# Patient Record
Sex: Male | Born: 1945 | Race: White | Hispanic: No | Marital: Married | Smoking: Former smoker
Health system: Southern US, Community
[De-identification: ages and names within clinical notes are randomized; demographics above are authoritative.]

## PROBLEM LIST (undated history)

## (undated) DIAGNOSIS — Q613 Polycystic kidney, unspecified: Secondary | ICD-10-CM

## (undated) DIAGNOSIS — E785 Hyperlipidemia, unspecified: Secondary | ICD-10-CM

## (undated) DIAGNOSIS — M5126 Other intervertebral disc displacement, lumbar region: Secondary | ICD-10-CM

## (undated) DIAGNOSIS — M51369 Other intervertebral disc degeneration, lumbar region without mention of lumbar back pain or lower extremity pain: Secondary | ICD-10-CM

## (undated) DIAGNOSIS — M5136 Other intervertebral disc degeneration, lumbar region: Secondary | ICD-10-CM

## (undated) DIAGNOSIS — D649 Anemia, unspecified: Secondary | ICD-10-CM

## (undated) DIAGNOSIS — M1990 Unspecified osteoarthritis, unspecified site: Secondary | ICD-10-CM

## (undated) DIAGNOSIS — L0390 Cellulitis, unspecified: Secondary | ICD-10-CM

## (undated) DIAGNOSIS — M19079 Primary osteoarthritis, unspecified ankle and foot: Secondary | ICD-10-CM

## (undated) DIAGNOSIS — M5430 Sciatica, unspecified side: Secondary | ICD-10-CM

## (undated) DIAGNOSIS — M109 Gout, unspecified: Secondary | ICD-10-CM

## (undated) HISTORY — DX: Sciatica, unspecified side: M54.30

## (undated) HISTORY — DX: Gout, unspecified: M10.9

## (undated) HISTORY — DX: Other intervertebral disc degeneration, lumbar region: M51.36

## (undated) HISTORY — PX: CYST EXCISION: SHX5701

## (undated) HISTORY — PX: TONSILLECTOMY: SUR1361

## (undated) HISTORY — DX: Other intervertebral disc displacement, lumbar region: M51.26

## (undated) HISTORY — DX: Other intervertebral disc degeneration, lumbar region without mention of lumbar back pain or lower extremity pain: M51.369

## (undated) HISTORY — DX: Hyperlipidemia, unspecified: E78.5

---

## 2011-10-01 DIAGNOSIS — N189 Chronic kidney disease, unspecified: Secondary | ICD-10-CM | POA: Insufficient documentation

## 2013-08-17 ENCOUNTER — Ambulatory Visit (INDEPENDENT_AMBULATORY_CARE_PROVIDER_SITE_OTHER): Payer: Medicare Other | Admitting: Family Medicine

## 2013-08-17 ENCOUNTER — Encounter (INDEPENDENT_AMBULATORY_CARE_PROVIDER_SITE_OTHER): Payer: Self-pay

## 2013-08-17 VITALS — BP 116/73 | HR 89 | Temp 97.4°F | Wt 191.0 lb

## 2013-08-17 DIAGNOSIS — L409 Psoriasis, unspecified: Secondary | ICD-10-CM

## 2013-08-17 DIAGNOSIS — R059 Cough, unspecified: Secondary | ICD-10-CM

## 2013-08-17 DIAGNOSIS — J069 Acute upper respiratory infection, unspecified: Secondary | ICD-10-CM

## 2013-08-17 DIAGNOSIS — L408 Other psoriasis: Secondary | ICD-10-CM

## 2013-08-17 DIAGNOSIS — R05 Cough: Secondary | ICD-10-CM

## 2013-08-17 MED ORDER — AZITHROMYCIN 250 MG PO TABS: ORAL_TABLET | ORAL | Status: DC

## 2013-08-17 MED ORDER — BENZONATATE 100 MG PO CAPS: 200.0000 mg | ORAL_CAPSULE | Freq: Three times a day (TID) | ORAL | Status: DC | PRN

## 2013-08-17 MED ORDER — METHYLPREDNISOLONE ACETATE 80 MG/ML IJ SUSP
80.0000 mg | Freq: Once | INTRAMUSCULAR | Status: AC
  Administered 2013-08-17: 80 mg via INTRAMUSCULAR

## 2013-08-17 MED ORDER — FLUOCINONIDE 0.05 % EX SOLN: 1.0000 "application " | Freq: Two times a day (BID) | CUTANEOUS | Status: DC

## 2013-08-17 NOTE — Patient Instructions (Signed)

## 2013-08-17 NOTE — Progress Notes (Signed)
   Subjective:    Patient ID: VA CHUKWU, male    DOB: 1946-02-02, 68 y.o.   MRN: [id number]  HPI  This 68 y.o. male presents for evaluation of URI sx's.  He is taking otc cough and cold meds and they are not helping.  He is having sinus drainage and sinus pressure.  He Has been coughing alot.  He would like a refill on his psoriasis medicine.  Review of Systems    No chest pain, SOB, HA, dizziness, vision change, N/V, diarrhea, constipation, dysuria, urinary urgency or frequency, myalgias, arthralgias or rash.  Objective:   Physical Exam Vital signs noted  Well developed well nourished male.  HEENT - Head atraumatic Normocephalic                Eyes - PERRLA, Conjuctiva - clear Sclera- Clear EOMI                Ears - EAC's Wnl TM's Wnl Gross Hearing WNL                Nose - Nares patent                 Throat - oropharanx wnl Respiratory - Lungs CTA bilateral Cardiac - RRR S1 and S2 without murmur GI - Abdomen soft Nontender and bowel sounds active x 4 Extremities - No edema. Neuro - Grossly intact.       Assessment & Plan:  Psoriasis - Plan: fluocinonide (LIDEX) 0.05 % external solution  URI (upper respiratory infection) - Plan: azithromycin (ZITHROMAX) 250 MG tablet, methylPREDNISolone acetate (DEPO-MEDROL) injection 80 mg  Cough - Tessalon perles 100mg  2 po tid prn cough #30w/1rf Push po fluids, rest, tylenol and motrin otc prn as directed for fever, arthralgias, and myalgias.  Follow up prn if sx's continue or persist.  Lysbeth Penner FNP

## 2014-07-28 DIAGNOSIS — M5430 Sciatica, unspecified side: Secondary | ICD-10-CM

## 2014-07-28 HISTORY — DX: Sciatica, unspecified side: M54.30

## 2014-08-24 ENCOUNTER — Ambulatory Visit (INDEPENDENT_AMBULATORY_CARE_PROVIDER_SITE_OTHER): Payer: Medicare Other

## 2014-08-24 ENCOUNTER — Encounter (INDEPENDENT_AMBULATORY_CARE_PROVIDER_SITE_OTHER): Payer: Self-pay

## 2014-08-24 ENCOUNTER — Encounter: Payer: Self-pay | Admitting: Family Medicine

## 2014-08-24 ENCOUNTER — Other Ambulatory Visit: Payer: Self-pay | Admitting: Family Medicine

## 2014-08-24 ENCOUNTER — Ambulatory Visit (INDEPENDENT_AMBULATORY_CARE_PROVIDER_SITE_OTHER): Payer: Medicare Other | Admitting: Family Medicine

## 2014-08-24 VITALS — BP 122/84 | HR 112 | Temp 98.2°F | Ht 71.0 in | Wt 189.4 lb

## 2014-08-24 DIAGNOSIS — M25571 Pain in right ankle and joints of right foot: Secondary | ICD-10-CM

## 2014-08-24 DIAGNOSIS — S82892A Other fracture of left lower leg, initial encounter for closed fracture: Secondary | ICD-10-CM

## 2014-08-24 DIAGNOSIS — S82891A Other fracture of right lower leg, initial encounter for closed fracture: Secondary | ICD-10-CM

## 2014-08-24 MED ORDER — METHYLPREDNISOLONE (PAK) 4 MG PO TABS: ORAL_TABLET | ORAL | Status: DC

## 2014-08-24 MED ORDER — HYDROCODONE-ACETAMINOPHEN 5-325 MG PO TABS: 1.0000 | ORAL_TABLET | Freq: Four times a day (QID) | ORAL | Status: DC | PRN

## 2014-08-24 NOTE — Progress Notes (Signed)
   Subjective:    Patient ID: Michael Cervantes, male    DOB: Apr 07, 1946, 69 y.o.   MRN: [id number]  HPI Patient is here for c/o right ankle pain.  He denies any trauma and states he took his gout medicine but it has not helped.  Review of Systems  Constitutional: Negative for fever.  HENT: Negative for ear pain.   Eyes: Negative for discharge.  Respiratory: Negative for cough.   Cardiovascular: Negative for chest pain.  Gastrointestinal: Negative for abdominal distention.  Endocrine: Negative for polyuria.  Genitourinary: Negative for difficulty urinating.  Musculoskeletal: Positive for joint swelling. Negative for gait problem and neck pain.       Right medial ankle swelling and tender.  Skin: Negative for color change and rash.  Neurological: Negative for speech difficulty and headaches.  Psychiatric/Behavioral: Negative for agitation.       Objective:    BP 122/84 mmHg  Pulse 112  Temp(Src) 98.2 F (36.8 C) (Oral)  Ht 5\' 11"  (1.803 m)  Wt 189 lb 6.4 oz (85.911 kg)  BMI 26.43 kg/m2 Physical Exam  Constitutional: He is oriented to person, place, and time. He appears well-developed and well-nourished.  HENT:  Head: Normocephalic and atraumatic.  Mouth/Throat: Oropharynx is clear and moist.  Eyes: Pupils are equal, round, and reactive to light.  Neck: Normal range of motion. Neck supple.  Cardiovascular: Normal rate and regular rhythm.   No murmur heard. Pulmonary/Chest: Effort normal and breath sounds normal.  Abdominal: Soft. Bowel sounds are normal. There is no tenderness.  Musculoskeletal: He exhibits edema and tenderness.  TTP right medial ankle and swelling right medial ankle  Neurological: He is alert and oriented to person, place, and time.  Skin: Skin is warm and dry.  Psychiatric: He has a normal mood and affect.    Xray right ankle - no fx Prelimnary reading by Iverson Alamin      Assessment & Plan:     ICD-9-CM ICD-10-CM   1. Pain in joint, ankle  and foot, right 719.47 M25.571 DG Foot Complete Right     methylPREDNIsolone (MEDROL DOSPACK) 4 MG tablet     HYDROcodone-acetaminophen (NORCO) 5-325 MG per tablet     No Follow-up on file.  Lysbeth Penner FNP

## 2014-08-28 DIAGNOSIS — L0390 Cellulitis, unspecified: Secondary | ICD-10-CM

## 2014-08-28 HISTORY — DX: Cellulitis, unspecified: L03.90

## 2014-09-12 ENCOUNTER — Inpatient Hospital Stay (HOSPITAL_COMMUNITY)
Admission: EM | Admit: 2014-09-12 | Discharge: 2014-09-15 | DRG: 872 | Disposition: A | Discharge status: Home / Self Care | Payer: Medicare Other | Attending: Family Medicine | Admitting: Family Medicine

## 2014-09-12 ENCOUNTER — Encounter (HOSPITAL_COMMUNITY): Payer: Self-pay | Admitting: Emergency Medicine

## 2014-09-12 ENCOUNTER — Emergency Department (HOSPITAL_COMMUNITY): Payer: Medicare Other

## 2014-09-12 ENCOUNTER — Inpatient Hospital Stay (HOSPITAL_COMMUNITY): Payer: Medicare Other

## 2014-09-12 DIAGNOSIS — M1990 Unspecified osteoarthritis, unspecified site: Secondary | ICD-10-CM | POA: Diagnosis present

## 2014-09-12 DIAGNOSIS — L03115 Cellulitis of right lower limb: Secondary | ICD-10-CM | POA: Insufficient documentation

## 2014-09-12 DIAGNOSIS — R42 Dizziness and giddiness: Secondary | ICD-10-CM | POA: Diagnosis not present

## 2014-09-12 DIAGNOSIS — N189 Chronic kidney disease, unspecified: Secondary | ICD-10-CM | POA: Diagnosis present

## 2014-09-12 DIAGNOSIS — M25571 Pain in right ankle and joints of right foot: Secondary | ICD-10-CM

## 2014-09-12 DIAGNOSIS — I129 Hypertensive chronic kidney disease with stage 1 through stage 4 chronic kidney disease, or unspecified chronic kidney disease: Secondary | ICD-10-CM | POA: Diagnosis present

## 2014-09-12 DIAGNOSIS — R05 Cough: Secondary | ICD-10-CM

## 2014-09-12 DIAGNOSIS — M109 Gout, unspecified: Secondary | ICD-10-CM | POA: Diagnosis present

## 2014-09-12 DIAGNOSIS — Q613 Polycystic kidney, unspecified: Secondary | ICD-10-CM | POA: Diagnosis not present

## 2014-09-12 DIAGNOSIS — N179 Acute kidney failure, unspecified: Secondary | ICD-10-CM | POA: Diagnosis present

## 2014-09-12 DIAGNOSIS — M79671 Pain in right foot: Secondary | ICD-10-CM

## 2014-09-12 DIAGNOSIS — I9589 Other hypotension: Secondary | ICD-10-CM | POA: Insufficient documentation

## 2014-09-12 DIAGNOSIS — Z87891 Personal history of nicotine dependence: Secondary | ICD-10-CM

## 2014-09-12 DIAGNOSIS — R6520 Severe sepsis without septic shock: Secondary | ICD-10-CM | POA: Diagnosis present

## 2014-09-12 DIAGNOSIS — R059 Cough, unspecified: Secondary | ICD-10-CM

## 2014-09-12 DIAGNOSIS — M10361 Gout due to renal impairment, right knee: Secondary | ICD-10-CM | POA: Insufficient documentation

## 2014-09-12 DIAGNOSIS — A419 Sepsis, unspecified organism: Secondary | ICD-10-CM | POA: Diagnosis not present

## 2014-09-12 DIAGNOSIS — L0390 Cellulitis, unspecified: Secondary | ICD-10-CM | POA: Diagnosis present

## 2014-09-12 HISTORY — DX: Polycystic kidney, unspecified: Q61.3

## 2014-09-12 HISTORY — DX: Unspecified osteoarthritis, unspecified site: M19.90

## 2014-09-12 HISTORY — DX: Primary osteoarthritis, unspecified ankle and foot: M19.079

## 2014-09-12 HISTORY — DX: Anemia, unspecified: D64.9

## 2014-09-12 HISTORY — DX: Cellulitis, unspecified: L03.90

## 2014-09-12 LAB — COMPREHENSIVE METABOLIC PANEL
ALT: 18 U/L (ref 0–53)
AST: 20 U/L (ref 0–37)
Albumin: 3.5 g/dL (ref 3.5–5.2)
Alkaline Phosphatase: 101 U/L (ref 39–117)
Anion gap: 14 (ref 5–15)
BUN: 77 mg/dL — ABNORMAL HIGH (ref 6–23)
CHLORIDE: 104 mmol/L (ref 96–112)
CO2: 20 mmol/L (ref 19–32)
Calcium: 10.1 mg/dL (ref 8.4–10.5)
Creatinine, Ser: 3.33 mg/dL — ABNORMAL HIGH (ref 0.50–1.35)
GFR calc Af Amer: 20 mL/min — ABNORMAL LOW (ref >90)
GFR calc non Af Amer: 17 mL/min — ABNORMAL LOW (ref >90)
GLUCOSE: 105 mg/dL — AB (ref 70–99)
Potassium: 4.7 mmol/L (ref 3.5–5.1)
Sodium: 138 mmol/L (ref 135–145)
TOTAL PROTEIN: 7.4 g/dL (ref 6.0–8.3)
Total Bilirubin: 1 mg/dL (ref 0.3–1.2)

## 2014-09-12 LAB — CBC WITH DIFFERENTIAL/PLATELET
Basophils Absolute: 0 10*3/uL (ref 0.0–0.1)
Basophils Relative: 0 % (ref 0–1)
EOS ABS: 0.1 10*3/uL (ref 0.0–0.7)
Eosinophils Relative: 0 % (ref 0–5)
HEMATOCRIT: 34.1 % — AB (ref 39.0–52.0)
HEMOGLOBIN: 11.5 g/dL — AB (ref 13.0–17.0)
Lymphocytes Relative: 9 % — ABNORMAL LOW (ref 12–46)
Lymphs Abs: 1 10*3/uL (ref 0.7–4.0)
MCH: 32.8 pg (ref 26.0–34.0)
MCHC: 33.7 g/dL (ref 30.0–36.0)
MCV: 97.2 fL (ref 78.0–100.0)
MONOS PCT: 13 % — AB (ref 3–12)
Monocytes Absolute: 1.5 10*3/uL — ABNORMAL HIGH (ref 0.1–1.0)
NEUTROS PCT: 78 % — AB (ref 43–77)
Neutro Abs: 9.2 10*3/uL — ABNORMAL HIGH (ref 1.7–7.7)
Platelets: 258 10*3/uL (ref 150–400)
RBC: 3.51 MIL/uL — ABNORMAL LOW (ref 4.22–5.81)
RDW: 12.1 % (ref 11.5–15.5)
WBC: 11.8 10*3/uL — ABNORMAL HIGH (ref 4.0–10.5)

## 2014-09-12 LAB — I-STAT CHEM 8, ED
BUN: 81 mg/dL — AB (ref 6–23)
CHLORIDE: 105 mmol/L (ref 96–112)
Calcium, Ion: 1.23 mmol/L (ref 1.13–1.30)
Creatinine, Ser: 3.3 mg/dL — ABNORMAL HIGH (ref 0.50–1.35)
Glucose, Bld: 105 mg/dL — ABNORMAL HIGH (ref 70–99)
HEMATOCRIT: 34 % — AB (ref 39.0–52.0)
Hemoglobin: 11.6 g/dL — ABNORMAL LOW (ref 13.0–17.0)
Potassium: 4.5 mmol/L (ref 3.5–5.1)
Sodium: 137 mmol/L (ref 135–145)
TCO2: 18 mmol/L (ref 0–100)

## 2014-09-12 LAB — URINALYSIS, ROUTINE W REFLEX MICROSCOPIC
Bilirubin Urine: NEGATIVE
Glucose, UA: NEGATIVE mg/dL
Hgb urine dipstick: NEGATIVE
Ketones, ur: NEGATIVE mg/dL
LEUKOCYTES UA: NEGATIVE
NITRITE: NEGATIVE
Protein, ur: NEGATIVE mg/dL
Specific Gravity, Urine: 1.013 (ref 1.005–1.030)
UROBILINOGEN UA: 0.2 mg/dL (ref 0.0–1.0)
pH: 5 (ref 5.0–8.0)

## 2014-09-12 LAB — I-STAT CG4 LACTIC ACID, ED: Lactic Acid, Venous: 1.53 mmol/L (ref 0.5–2.0)

## 2014-09-12 LAB — MRSA PCR SCREENING: MRSA by PCR: NEGATIVE

## 2014-09-12 MED ORDER — SODIUM CHLORIDE 0.9 % IV BOLUS (SEPSIS)
1000.0000 mL | Freq: Once | INTRAVENOUS | Status: AC
  Administered 2014-09-12: 1000 mL via INTRAVENOUS

## 2014-09-12 MED ORDER — SODIUM CHLORIDE 0.9 % IV SOLN
INTRAVENOUS | Status: AC
  Administered 2014-09-12 – 2014-09-13 (×2): via INTRAVENOUS

## 2014-09-12 MED ORDER — FENTANYL CITRATE 0.05 MG/ML IJ SOLN: 50.0000 ug | Freq: Once | INTRAMUSCULAR | Status: DC

## 2014-09-12 MED ORDER — VANCOMYCIN HCL 10 G IV SOLR
1500.0000 mg | Freq: Once | INTRAVENOUS | Status: AC
  Administered 2014-09-12: 1500 mg via INTRAVENOUS
  Filled 2014-09-12: qty 1500

## 2014-09-12 MED ORDER — ONDANSETRON HCL 4 MG/2ML IJ SOLN: 4.0000 mg | Freq: Four times a day (QID) | INTRAMUSCULAR | Status: DC | PRN

## 2014-09-12 MED ORDER — LIDOCAINE HCL (PF) 1 % IJ SOLN
INTRAMUSCULAR | Status: AC
  Filled 2014-09-12: qty 5

## 2014-09-12 MED ORDER — ACETAMINOPHEN 650 MG RE SUPP: 650.0000 mg | Freq: Four times a day (QID) | RECTAL | Status: DC | PRN

## 2014-09-12 MED ORDER — DEXTROSE 5 % IV SOLN
1.0000 g | Freq: Once | INTRAVENOUS | Status: AC
  Administered 2014-09-12: 1 g via INTRAVENOUS
  Filled 2014-09-12: qty 1

## 2014-09-12 MED ORDER — SODIUM CHLORIDE 0.9 % IJ SOLN
3.0000 mL | Freq: Two times a day (BID) | INTRAMUSCULAR | Status: DC
  Administered 2014-09-12 – 2014-09-14 (×3): 3 mL via INTRAVENOUS

## 2014-09-12 MED ORDER — ONDANSETRON HCL 4 MG PO TABS: 4.0000 mg | ORAL_TABLET | Freq: Four times a day (QID) | ORAL | Status: DC | PRN

## 2014-09-12 MED ORDER — FENTANYL CITRATE 0.05 MG/ML IJ SOLN
50.0000 ug | INTRAMUSCULAR | Status: DC | PRN
  Administered 2014-09-13 (×2): 50 ug via INTRAVENOUS
  Filled 2014-09-12 (×2): qty 2

## 2014-09-12 MED ORDER — DEXTROSE 5 % IV SOLN
1.0000 g | INTRAVENOUS | Status: DC
  Administered 2014-09-13: 1 g via INTRAVENOUS
  Filled 2014-09-12 (×2): qty 1

## 2014-09-12 MED ORDER — VANCOMYCIN HCL IN DEXTROSE 1-5 GM/200ML-% IV SOLN
1000.0000 mg | INTRAVENOUS | Status: DC
  Administered 2014-09-13: 1000 mg via INTRAVENOUS
  Filled 2014-09-12 (×3): qty 200

## 2014-09-12 MED ORDER — HEPARIN SODIUM (PORCINE) 5000 UNIT/ML IJ SOLN
5000.0000 [IU] | Freq: Three times a day (TID) | INTRAMUSCULAR | Status: DC
  Administered 2014-09-12 – 2014-09-15 (×8): 5000 [IU] via SUBCUTANEOUS
  Filled 2014-09-12 (×9): qty 1

## 2014-09-12 MED ORDER — ACETAMINOPHEN 325 MG PO TABS
650.0000 mg | ORAL_TABLET | Freq: Four times a day (QID) | ORAL | Status: DC | PRN
  Administered 2014-09-14: 650 mg via ORAL
  Filled 2014-09-12: qty 2

## 2014-09-12 NOTE — H&P (Signed)
[facility] Admission History and Physical Service Pager: [phone]  Patient name: Michael Cervantes Medical record number: [id number] Date of birth: May 25, 1946 Age: 69 y.o. Gender: male  Primary Care Provider: Redge Gainer, MD Consultants: Henry Russel Code Status: Full (confirmed on admission)  Chief Complaint: Right ankle/foot pain, warmth, swelling  Assessment and Plan: Michael Cervantes is a 69 y.o. male presenting with worsening Right ankle/foot pain now with redness, swelling, warmth for past 4 days, suspected cellulitis with concern for potential septic R-ankle joint given recent reported intraarticular steroid injection at Ortho office. PMH is significant for Polycystic Kidney Disease, h/o Gout.  # Severe Sepsis, secondary to Right Ankle/Foot cellulitis with concern for potential Septic Right Ankle Joint - Consider severe sepsis with hypotensive BP on admission, responded to IVF, with source of Right ankle/foot cellulitis. Clinically consistent with cellulitis with erythema, warm to touch, notable edema compared to Left foot/ankle, pain not as impressive on palpation but reportedly worse with weight bearing. Concern for possible septic arthritis R-ankle (given recent steroid injection into R-ankle joint). WBC 11.8, blood cultures collected, already received Vanc (MRSA coverage) / Cefepime in ED x 1 dose. - Differential dx: includes Gout, however seems unlikely given prolonged course (no improvement with prednisone) and now hypotension on admission. - Admit to SDU, [place], attending Dr. Erin Hearing - telemetry, monitoring - Right ankle/foot marked in ED, follow-up cellulitis progression - Continue Vancomycin / Cefepime per pharm - Fentanyl 13mcg IV q 2 hr PRN pain (cautious with hypotension) - Follow-up blood cultures (collected prior to abx) - Right ankle/foot X-ray with probable ankle joint effusion, no evidence of osteo - Repeat CBC w diff in AM - Consulted  Orthopedics ([place] - Dr. Onnie Graham) - discussed case, and he plans to come to see patient tonight to evaluation for potential septic R-ankle joint. He plans to evaluate and likely perform bedside joint aspiration, ordered cell counts, culture, glucose and protein for joint fluid aspirate. - UPDATE - Patient already seen by Dr. Onnie Graham approx [phone] and determined that on exam this presentation is more consistent with cellulitis, plan to hold on aspiration currently (avoid aspiration through cellulitic field) and monitor on antibiotics, Ortho will see in AM  # Hypotension - BP on admit 79/46, improved to 108/61 s/p 2L IVF - Monitor in SDU - Cont IVF, may bolus PRN - If persistent despite fluids, would call CCM for evaluation of unlikely but possible progression to septic shock  # Acute on chronic kidney diasease  # Acute on chronic kidney disease in setting of Polycystic Kidney Disease - Followed by [facility], call tomorrow to notify that pt admitted - On admit Cr to 3.30 (reported b/l around 2.0) - IVF - Follow-up BMET - Hold home Lasix, Lisinopril, Metoprolol given hypotension on admission - If extended admission, may resume these medications pending clinical improvement  # H/o Gout - Remote history of prior gout flares in Right ankle and other joints - Not on uric acid lowering medication at home  FEN/GI: NS @ 150cc/hr x 24 hours / Diet NPO chips + sips with meds until cleared by Ortho (if no OR tonight then may resume diet) Prophylaxis: SQ Heparin  Disposition: Admit to SDU FPTS for Right ankle cellulitis with concern for potential septic R-ankle joint, pending Ortho evaluation tonight and anticipate bedside joint aspiration with fluid studies. Continue antibiotics, pain control, IVF as needed given concern for hypotension. Anticipate hospitalization for multiple days.  History of Present Illness: Michael Cervantes is a  69 y.o. male presenting with Right ankle and foot pain, swelling, redness.  Reports that symptoms significantly worsened about 4 days ago causing him to seek immediate treatment in ED. Recent significant history with Right ankle pain that started about 1 month ago without inciting injury, admits to chronic history of Right ankle pain over past few years but nothing significant. He was seen about 1 month ago for this same complaint by PCP suspected possible gout at that time, treated with prednisone dose pak without significant improvement. Followed up on 08/24/14 with PCP given no improvement, ordered Right ankle X-ray, identified arthritis and referred to [facility]. Seen recently within past 1 week at [facility] by Dr. Drema Dallas, at that time given R-ankle steroid injection for pain thought due to osteoarthritis. Improved following injection for few days, then significant worsening about 4 days ago with redness, warmth, return of swelling (previously improved), and pain. Pain worst with weight bearing, now patient reports limited ambulation and trying to use walker for past few days. No medications have helped, tried Norco, Tramadol without relief.  In ED, had low BP with 79/46 given 2L IVF boluses with notable improvement to SBP 100s. Initial work-up with WBC 11.8, blood cultures collected and started on antibiotics (Vanc / Cefepime), admitted to SDU.  Review Of Systems: Per HPI with the following additions: none Otherwise 12 point review of systems was performed and was unremarkable.  Patient Active Problem List   Diagnosis Date Noted  . Cellulitis 09/12/2014  . Severe sepsis 09/12/2014  . Cellulitis of right foot   . Other specified hypotension    Past Medical History: Past Medical History  Diagnosis Date  . Polycystic kidney disease    Past Surgical History: History reviewed. No pertinent past surgical history. Social History: History  Substance Use Topics  . Smoking status: Former Smoker    Quit date: 07/28/1989  . Smokeless tobacco: Not on file  . Alcohol  Use: 0.0 oz/week    0 Standard drinks or equivalent per week     Comment: occ   Additional social history: Denies additional drugs, tobacco, alcohol  Please also refer to relevant sections of EMR.  Family History: History reviewed. No pertinent family history. Allergies and Medications: No Known Allergies No current facility-administered medications on file prior to encounter.   Current Outpatient Prescriptions on File Prior to Encounter  Medication Sig Dispense Refill  . lisinopril (PRINIVIL,ZESTRIL) 10 MG tablet Take 10 mg by mouth daily.    Marland Kitchen lovastatin (MEVACOR) 10 MG tablet Take 10 mg by mouth at bedtime.    Marland Kitchen azithromycin (ZITHROMAX) 250 MG tablet Take 2 po first day and then one po qd x 4 daysfl (Patient not taking: Reported on 09/12/2014) 6 tablet 0  . benzonatate (TESSALON PERLES) 100 MG capsule Take 2 capsules (200 mg total) by mouth 3 (three) times daily as needed. (Patient not taking: Reported on 09/12/2014) 30 capsule 1  . fluocinonide (LIDEX) 0.05 % external solution Apply 1 application topically 2 (two) times daily. (Patient not taking: Reported on 09/12/2014) 60 mL 3  . HYDROcodone-acetaminophen (NORCO) 5-325 MG per tablet Take 1 tablet by mouth every 6 (six) hours as needed for moderate pain. (Patient not taking: Reported on 09/12/2014) 15 tablet 0  . methylPREDNIsolone (MEDROL DOSPACK) 4 MG tablet follow package directions (Patient not taking: Reported on 09/12/2014) 21 tablet 0    Objective: BP 106/58 mmHg  Pulse 92  Temp(Src) 98.1 F (36.7 C) (Oral)  Resp 19  Ht 5\' 11"  (  1.803 m)  Wt 181 lb 14.1 oz (82.5 kg)  BMI 25.38 kg/m2  SpO2 100% Exam: General: laying in bed, appears comfortable, NAD HEENT: NCAT, PERRL, EOMI, oropharynx clear, MMM Cardiovascular: RRR, S1, S2 heard, no murmurs Lungs - CTAB, no wheezing, crackles, or rhonchi. Non-labored. Speaks full sentences. Abd - soft, NTND, no masses, +active BS MSK / Ext - Right Ankle/Foot: significant for mild ankle  and midfoot edema with localized mild erythema approx 5 to 10cm (marked in ED) mostly dorsal-medial mid foot, also involving Right lateral malleolus region. Tender to palpation across mid foot and lateral malleolus, muscle str ankle dorsiflexion/plantarflexion 5/5 without significant pain. Left ankle normal appearing w/o erythema or edema - Left middle toe appears erythematous, non-tender, no edema Ext - peripheral pulses +2 b/l Skin - warm, dry, no rashes Neuro - awake, alert, oriented x3, grossly non-focal, intact distal sensation to light touch, unable to test ambulation in ED (reported pain significantly worse with weight bearin   Labs and Imaging: CBC BMET   Recent Labs Lab 09/12/14 1234 09/12/14 1528  WBC 11.8*  --   HGB 11.5* 11.6*  HCT 34.1* 34.0*  PLT 258  --     Recent Labs Lab 09/12/14 1515 09/12/14 1528  NA 138 137  K 4.7 4.5  CL 104 105  CO2 20  --   BUN 77* 81*  CREATININE 3.33* 3.30*  GLUCOSE 105* 105*  CALCIUM 10.1  --      Lactic Acid - 1.53  WBC 11.8  Blood Cultures x2 - pending  2/16 CXR 2v - Negative  2/16 Right Ankle / Foot IMPRESSION: Probable ankle joint effusion. No acute findings otherwise.  (Prior Right Foot X-ray from 08/24/14) IMPRESSION: No radiographic evidence of acute bony abnormality.  No marginal erosions to suggest changes of gout. No significant soft tissue swelling.  Degenerative changes at the ankle joint with small calcific density potentially representing a loose body.   Nobie Putnam, DO 09/12/2014, 5:54 PM PGY-2, [place] Intern pager: [phone], text pages welcome

## 2014-09-12 NOTE — ED Notes (Signed)
Pt c/o right foot pain with swelling x several weeks; pt seen at ortho and given boot; pt noted hypotension with some gen weakness x several weeks; pt sts htn meds

## 2014-09-12 NOTE — ED Provider Notes (Signed)
TIME SEEN: 2:10 PM  CHIEF COMPLAINT: Dizziness, right foot swelling, redness and pain  HPI: Pt is a 69 y.o. male with history of chronic disease, polycystic kidney disease who presents to the emergency department with complaints of right foot pain, swelling, erythema and warmth. States the swelling and pain is been present for the past week and a half. States he had an injection with his primary care doctor at [facility] week and a half ago to this foot. Over the past 4 days he has noticed erythema and warmth is also been dizzy. Has had chills but no known fever. Reports he is also had a dry cough. No headache, neck pain or neck stiffness, no rash. No chest pain or abdominal pain. No vomiting or diarrhea. No sick contacts or recent travel.  ROS: See HPI Constitutional: no fever  Eyes: no drainage  ENT: no runny nose   Cardiovascular:  no chest pain  Resp: no SOB  GI: no vomiting GU: no dysuria Integumentary: no rash  Allergy: no hives  Musculoskeletal: no leg swelling  Neurological: no slurred speech ROS otherwise negative  PAST MEDICAL HISTORY/PAST SURGICAL HISTORY:  Past Medical History  Diagnosis Date  . Polycystic kidney disease     MEDICATIONS:  Prior to Admission medications   Medication Sig Start Date End Date Taking? Authorizing Provider  azithromycin (ZITHROMAX) 250 MG tablet Take 2 po first day and then one po qd x 4 daysfl 08/17/13   Lysbeth Penner, FNP  benzonatate (TESSALON PERLES) 100 MG capsule Take 2 capsules (200 mg total) by mouth 3 (three) times daily as needed. 08/17/13   Lysbeth Penner, FNP  fluocinonide (LIDEX) 0.05 % external solution Apply 1 application topically 2 (two) times daily. 08/17/13   Lysbeth Penner, FNP  hydrochlorothiazide (MICROZIDE) 12.5 MG capsule Take 12.5 mg by mouth daily.    Historical Provider, MD  HYDROcodone-acetaminophen (NORCO) 5-325 MG per tablet Take 1 tablet by mouth every 6 (six) hours as needed for  moderate pain. 08/24/14   Lysbeth Penner, FNP  lisinopril (PRINIVIL,ZESTRIL) 10 MG tablet Take 10 mg by mouth daily.    Historical Provider, MD  lovastatin (MEVACOR) 10 MG tablet Take 10 mg by mouth at bedtime.    Historical Provider, MD  methylPREDNIsolone (MEDROL DOSPACK) 4 MG tablet follow package directions 08/24/14   Lysbeth Penner, FNP    ALLERGIES:  No Known Allergies  SOCIAL HISTORY:  History  Substance Use Topics  . Smoking status: Former Smoker    Quit date: 07/28/1989  . Smokeless tobacco: Not on file  . Alcohol Use: 0.0 oz/week    0 Standard drinks or equivalent per week     Comment: occ    FAMILY HISTORY: History reviewed. No pertinent family history.  EXAM: BP 94/61 mmHg  Pulse 100  Temp(Src) 98.4 F (36.9 C) (Oral)  Resp 22  Ht 5\' 11"  (1.803 m)  Wt 185 lb (83.915 kg)  BMI 25.81 kg/m2  SpO2 100% CONSTITUTIONAL: Alert and oriented and responds appropriately to questions. Well-appearing; well-nourished HEAD: Normocephalic EYES: Conjunctivae clear, PERRL ENT: normal nose; no rhinorrhea; moist mucous membranes; pharynx without lesions noted NECK: Supple, no meningismus, no LAD  CARD: Regular and tachycardic; S1 and S2 appreciated; no murmurs, no clicks, no rubs, no gallops RESP: Normal chest excursion without splinting or tachypnea; breath sounds clear and equal bilaterally; no wheezes, no rhonchi, no rales,  ABD/GI: Normal bowel sounds; non-distended; soft, non-tender, no rebound, no guarding BACK:  The back appears normal and is non-tender to palpation, there is no CVA tenderness EXT: Right foot is tender to palpation diffusely throughout the dorsal aspect, there is associated erythema and warmth, 2+ DP pulses bilaterally, no joint effusion, normal and painless range of motion in the toes and ankle on the right side, no calf tenderness or swelling, [place] soft, otherwise Normal ROM in all joints; otherwise extremities are non-tender to palpation; no edema;  normal capillary refill; no cyanosis    SKIN: Normal color for age and race; warm NEURO: Moves all extremities equally PSYCH: The patient's mood and manner are appropriate. Grooming and personal hygiene are appropriate.  MEDICAL DECISION MAKING: Patient here with what appears to be cellulitis as well as hypotension, tachycardia. He reports he is on lisinopril, Lasix and hydrochlorothiazide. States his blood pressure is normally 1:30/80. Will give IV fluids. Will give broad-spectrum antibiotics. Will obtain rectal time, labs, EKG. Patient will need admission.  ED PROGRESS: Patient's blood pressure is improving with IV fluids but is still 93/53. Heart rate is down in the 80s. He is still mentating normally. EKG shows no ischemic changes. Labs show leukocytosis of 11.8 with left shift. He has creatinine elevated at 3.3 reports he has a known history of chronic kidney disease. Lactate normal. Chest x-ray clear. Cultures pending. We'll discuss with hospitalist for admission to step down. We'll continue IV hydration.   3:50 PM  Pt's BP improving with IV fluids. He is still mentating well. Heart rate is also improving. Discussed with family medicine resident for admission. Will admit to step down. Attending is Dr. Phylis Bougie.     EKG Interpretation  Date/Time:  Tuesday September 12 2014 14:15:52 EST Ventricular Rate:  83 PR Interval:  138 QRS Duration: 88 QT Interval:  349 QTC Calculation: 410 R Axis:   37 Text Interpretation:  Sinus rhythm RSR' in V1 or V2, right VCD or RVH Borderline T abnormalities, anterior leads Baseline wander in lead(s) V2 Partial missing lead(s): V3 Confirmed by Kayda Allers,  DO, Rosalio Catterton [id number]) on 09/12/2014 2:24:24 PM         CRITICAL CARE Performed by: Nyra Jabs   Total critical care time: 45 minutes  Critical care time was exclusive of separately billable procedures and treating other patients.  Critical care was necessary to treat or prevent imminent or  life-threatening deterioration.  Critical care was time spent personally by me on the following activities: development of treatment plan with patient and/or surrogate as well as nursing, discussions with consultants, evaluation of patient's response to treatment, examination of patient, obtaining history from patient or surrogate, ordering and performing treatments and interventions, ordering and review of laboratory studies, ordering and review of radiographic studies, pulse oximetry and re-evaluation of patient's condition.    [place], DO 09/12/14 1550

## 2014-09-12 NOTE — ED Notes (Signed)
Janett Billow, RN aware of pt's blood pressure

## 2014-09-12 NOTE — Progress Notes (Signed)
ANTIBIOTIC CONSULT NOTE - INITIAL  Pharmacy Consult for vancomycin and cefepime Indication: rule out sepsis  No Known Allergies  Patient Measurements: Height: 5\' 11"  (180.3 cm) Weight: 185 lb (83.915 kg) IBW/kg (Calculated) : 75.3   Vital Signs: Temp: 99.3 F (37.4 C) (02/16 1423) Temp Source: Rectal (02/16 1423) BP: 102/65 mmHg (02/16 1615) Pulse Rate: 82 (02/16 1615) Intake/Output from previous day:   Intake/Output from this shift: Total I/O In: -  Out: 150 [Urine:150]  Labs:  Recent Labs  09/12/14 1234 09/12/14 1515 09/12/14 1528  WBC 11.8*  --   --   HGB 11.5*  --  11.6*  PLT 258  --   --   CREATININE  --  3.33* 3.30*   Estimated Creatinine Clearance: 22.5 mL/min (by C-G formula based on Cr of 3.3). No results for input(s): VANCOTROUGH, VANCOPEAK, VANCORANDOM, GENTTROUGH, GENTPEAK, GENTRANDOM, TOBRATROUGH, TOBRAPEAK, TOBRARND, AMIKACINPEAK, AMIKACINTROU, AMIKACIN in the last 72 hours.   Microbiology: No results found for this or any previous visit (from the past 720 hour(s)).  Medical History: Past Medical History  Diagnosis Date  . Polycystic kidney disease     Medications:  Prescriptions prior to admission  Medication Sig Dispense Refill Last Dose  . furosemide (LASIX) 40 MG tablet Take 40 mg by mouth daily.   09/12/2014 at Unknown time  . hydrochlorothiazide (HYDRODIURIL) 25 MG tablet Take 25 mg by mouth daily.   09/12/2014 at Unknown time  . lisinopril (PRINIVIL,ZESTRIL) 10 MG tablet Take 10 mg by mouth daily.   09/12/2014 at Unknown time  . lovastatin (MEVACOR) 10 MG tablet Take 10 mg by mouth at bedtime.   09/11/2014 at Unknown time  . lovastatin (MEVACOR) 40 MG tablet Take 40 mg by mouth at bedtime.   09/11/2014 at Unknown time  . azithromycin (ZITHROMAX) 250 MG tablet Take 2 po first day and then one po qd x 4 daysfl (Patient not taking: Reported on 09/12/2014) 6 tablet 0 Completed Course at Unknown time  . benzonatate (TESSALON PERLES) 100 MG capsule  Take 2 capsules (200 mg total) by mouth 3 (three) times daily as needed. (Patient not taking: Reported on 09/12/2014) 30 capsule 1 Completed Course at Unknown time  . fluocinonide (LIDEX) 0.05 % external solution Apply 1 application topically 2 (two) times daily. (Patient not taking: Reported on 09/12/2014) 60 mL 3 Completed Course at Unknown time  . HYDROcodone-acetaminophen (NORCO) 5-325 MG per tablet Take 1 tablet by mouth every 6 (six) hours as needed for moderate pain. (Patient not taking: Reported on 09/12/2014) 15 tablet 0 Completed Course at Unknown time  . methylPREDNIsolone (MEDROL DOSPACK) 4 MG tablet follow package directions (Patient not taking: Reported on 09/12/2014) 21 tablet 0 Completed Course at Unknown time   Assessment: 69 yo man to start broad spectrum antibiotics for r/o sepsis.  His SrCr is 3.33, est CrCl ~22 ml/min.  Goal of Therapy:  Vancomycin trough level 15-20 mcg/ml  Plan:  Give vancomycin 1500mg  IV x 1 then 1gm IV q24 hours Give cefepime 1g IV x 1 then 1 gm IV q24 hours Monitor renal function, clinical picture  Jennavecia Schwier [place] 09/12/2014,5:01 PM

## 2014-09-12 NOTE — Consult Note (Signed)
Reason for Consult:right foot and ankle pain and swelling Referring Physician: family medicine  HPI: Michael Cervantes is an 69 y.o. male with several underlying medical comorbidities including polysystic kidney disease, HTN,OA, gout?(patient unsure of diagnosis), seen by primary care provider approx 3 weeks ago for right foot and ankle pain and swelling, presumptive OA vs gout and started on oral prednisone with modest short term improvement. Recurrence/persistence of pain with subsequent referral to Dr. Drema Dallas of [facility], received intraarticular injection R ankle approx 2 weeks ago, with several days of improvement in pain and swelling. reports recurrence of pain and swelling late last week, was scheduled to follow up at orthopedic office yesterday but office closed due to weather, presented to [facility] ED today. Past Medical History  Diagnosis Date  . Polycystic kidney disease   . Hypertension   . Arthritis   . OA (osteoarthritis) of foot     rt foot  . Anemia   . Cellulitis 08/2014    rt foot    Past Surgical History  Procedure Laterality Date  . Cyst excision    . Tonsillectomy      History reviewed. No pertinent family history.  Social History:  reports that he quit smoking about 25 years ago. His smokeless tobacco use includes Snuff. He reports that he drinks alcohol. He reports that he does not use illicit drugs.  Allergies: No Known Allergies  Medications: I have reviewed the patient's current medications.  Results for orders placed or performed during the hospital encounter of 09/12/14 (from the past 48 hour(s))  CBC with Differential     Status: Abnormal   Collection Time: 09/12/14 12:34 PM  Result Value Ref Range   WBC 11.8 (H) 4.0 - 10.5 K/uL   RBC 3.51 (L) 4.22 - 5.81 MIL/uL   Hemoglobin 11.5 (L) 13.0 - 17.0 g/dL   HCT 34.1 (L) 39.0 - 52.0 %   MCV 97.2 78.0 - 100.0 fL   MCH 32.8 26.0 - 34.0 pg   MCHC 33.7 30.0 - 36.0 g/dL   RDW 12.1 11.5 - 15.5 %    Platelets 258 150 - 400 K/uL   Neutrophils Relative % 78 (H) 43 - 77 %   Neutro Abs 9.2 (H) 1.7 - 7.7 K/uL   Lymphocytes Relative 9 (L) 12 - 46 %   Lymphs Abs 1.0 0.7 - 4.0 K/uL   Monocytes Relative 13 (H) 3 - 12 %   Monocytes Absolute 1.5 (H) 0.1 - 1.0 K/uL   Eosinophils Relative 0 0 - 5 %   Eosinophils Absolute 0.1 0.0 - 0.7 K/uL   Basophils Relative 0 0 - 1 %   Basophils Absolute 0.0 0.0 - 0.1 K/uL  I-Stat CG4 Lactic Acid, ED     Status: None   Collection Time: 09/12/14  1:03 PM  Result Value Ref Range   Lactic Acid, Venous 1.53 0.5 - 2.0 mmol/L  Comprehensive metabolic panel     Status: Abnormal   Collection Time: 09/12/14  3:15 PM  Result Value Ref Range   Sodium 138 135 - 145 mmol/L   Potassium 4.7 3.5 - 5.1 mmol/L   Chloride 104 96 - 112 mmol/L   CO2 20 19 - 32 mmol/L   Glucose, Bld 105 (H) 70 - 99 mg/dL   BUN 77 (H) 6 - 23 mg/dL   Creatinine, Ser 3.33 (H) 0.50 - 1.35 mg/dL   Calcium 10.1 8.4 - 10.5 mg/dL   Total Protein 7.4 6.0 - 8.3 g/dL  Albumin 3.5 3.5 - 5.2 g/dL   AST 20 0 - 37 U/L   ALT 18 0 - 53 U/L   Alkaline Phosphatase 101 39 - 117 U/L   Total Bilirubin 1.0 0.3 - 1.2 mg/dL   GFR calc non Af Amer 17 (L) >90 mL/min   GFR calc Af Amer 20 (L) >90 mL/min    Comment: (NOTE) The eGFR has been calculated using the CKD EPI equation. This calculation has not been validated in all clinical situations. eGFR's persistently <90 mL/min signify possible Chronic Kidney Disease.    Anion gap 14 5 - 15  I-stat chem 8, ed     Status: Abnormal   Collection Time: 09/12/14  3:28 PM  Result Value Ref Range   Sodium 137 135 - 145 mmol/L   Potassium 4.5 3.5 - 5.1 mmol/L   Chloride 105 96 - 112 mmol/L   BUN 81 (H) 6 - 23 mg/dL   Creatinine, Ser 3.30 (H) 0.50 - 1.35 mg/dL   Glucose, Bld 105 (H) 70 - 99 mg/dL   Calcium, Ion 1.23 1.13 - 1.30 mmol/L   TCO2 18 0 - 100 mmol/L   Hemoglobin 11.6 (L) 13.0 - 17.0 g/dL   HCT 34.0 (L) 39.0 - 52.0 %  Urinalysis, Routine w reflex  microscopic     Status: None   Collection Time: 09/12/14  3:33 PM  Result Value Ref Range   Color, Urine YELLOW YELLOW   APPearance CLEAR CLEAR   Specific Gravity, Urine 1.013 1.005 - 1.030   pH 5.0 5.0 - 8.0   Glucose, UA NEGATIVE NEGATIVE mg/dL   Hgb urine dipstick NEGATIVE NEGATIVE   Bilirubin Urine NEGATIVE NEGATIVE   Ketones, ur NEGATIVE NEGATIVE mg/dL   Protein, ur NEGATIVE NEGATIVE mg/dL   Urobilinogen, UA 0.2 0.0 - 1.0 mg/dL   Nitrite NEGATIVE NEGATIVE   Leukocytes, UA NEGATIVE NEGATIVE    Comment: MICROSCOPIC NOT DONE ON URINES WITH NEGATIVE PROTEIN, BLOOD, LEUKOCYTES, NITRITE, OR GLUCOSE <1000 mg/dL.    Dg Chest 2 View  09/12/2014   CLINICAL DATA:  69 year old male with cough and congestion for 8 days. Initial encounter. Personal history former smoker.  EXAM: CHEST  2 VIEW  COMPARISON:  None.  FINDINGS: Upper limits of normal lung volumes. Normal cardiac size and mediastinal contours. Visualized tracheal air column is within normal limits. No pneumothorax, pulmonary edema, pleural effusion or confluent pulmonary opacity. No acute osseous abnormality identified.  IMPRESSION: No acute cardiopulmonary abnormality.   Electronically Signed   By: Genevie Ann M.D.   On: 09/12/2014 14:46   Dg Ankle Right Port  09/12/2014   CLINICAL DATA:  Right foot cellulitis initial evaluation, concern for septic arthritis right ankle joint  EXAM: PORTABLE RIGHT ANKLE - 2 VIEW  COMPARISON:  None.  FINDINGS: No fracture or dislocation. Mild tibiotalar arthritis with small anterior osteophyte. Increased attenuation surrounding the joint suggests possibility of small to moderate joint effusion. No evidence of osteomyelitis. No evidence of erosive change involving the osseous components of the ankle joint.  IMPRESSION: Probable ankle joint effusion.  No acute findings otherwise.   Electronically Signed   By: Skipper Cliche M.D.   On: 09/12/2014 19:13   Dg Foot Complete Right  09/12/2014   CLINICAL DATA:   Initial evaluation for right foot cellulitis, concern for septic arthritis of the ankle joint  EXAM: RIGHT FOOT COMPLETE - 3+ VIEW  COMPARISON:  None.  FINDINGS: There appears to be a moderate ankle joint effusion. There is  mild tibiotalar arthritis. No evidence of fracture or dislocation involving the bones of the right foot. No evidence of osteomyelitis or other acute findings.  IMPRESSION: Ankle joint effusion.  No acute findings otherwise.   Electronically Signed   By: Skipper Cliche M.D.   On: 09/12/2014 19:14     Vitals Temp:  [98.1 F (36.7 C)-99.3 F (37.4 C)] 98.1 F (36.7 C) (02/16 1652) Pulse Rate:  [76-111] 89 (02/16 1900) Resp:  [18-26] 24 (02/16 1900) BP: (79-108)/(46-66) 101/58 mmHg (02/16 1900) SpO2:  [96 %-100 %] 100 % (02/16 1830) Weight:  [82.5 kg (181 lb 14.1 oz)-83.915 kg (185 lb)] 82.5 kg (181 lb 14.1 oz) (02/16 1652) Body mass index is 25.38 kg/(m^2).  Physical Exam: A and O WM, NAD, R knee with painful arc of passive motion, no ecchymosis of erythema, no obvious effusion, diffusely tender about knee, calf soft, no cords, no ascending cellulitis or lymphangitis. Right ankle and foot with diffuse swelling, erythema about medial mid foot and forefoot and lateral ankle, ROM limited secondary to swelling. Moderate pain at end range with active and passive ROM. Left 3rd toe diffusely swollen and erythematous.     Assessment/Plan: Impression: clinical exam, labs, and recent history suggestive of gout/cellulitis. My exam this evening does not show the typical degree of painful motion I would expect with a septic joint, and given the surrounding cellulitis, reluctant to tap joint through zone of potential cellulitis. I do not identify a clinical situation that would warrant any type of emergent surgical intervention. Treatment: continue supportive care with Ice and elevation, ABX, consider colchicine or other gout therapy on empiric basis. Will follow up  tomorrow.  Fontaine Kossman M 09/12/2014, 7:44 PM

## 2014-09-13 DIAGNOSIS — R05 Cough: Secondary | ICD-10-CM | POA: Insufficient documentation

## 2014-09-13 DIAGNOSIS — R059 Cough, unspecified: Secondary | ICD-10-CM | POA: Insufficient documentation

## 2014-09-13 LAB — BASIC METABOLIC PANEL
ANION GAP: 10 (ref 5–15)
BUN: 66 mg/dL — AB (ref 6–23)
CO2: 19 mmol/L (ref 19–32)
Calcium: 9.2 mg/dL (ref 8.4–10.5)
Chloride: 110 mmol/L (ref 96–112)
Creatinine, Ser: 2.68 mg/dL — ABNORMAL HIGH (ref 0.50–1.35)
GFR calc non Af Amer: 23 mL/min — ABNORMAL LOW (ref >90)
GFR, EST AFRICAN AMERICAN: 26 mL/min — AB (ref >90)
Glucose, Bld: 106 mg/dL — ABNORMAL HIGH (ref 70–99)
Potassium: 4.3 mmol/L (ref 3.5–5.1)
SODIUM: 139 mmol/L (ref 135–145)

## 2014-09-13 LAB — CBC WITH DIFFERENTIAL/PLATELET
BASOS ABS: 0 10*3/uL (ref 0.0–0.1)
Basophils Relative: 0 % (ref 0–1)
EOS ABS: 0.2 10*3/uL (ref 0.0–0.7)
Eosinophils Relative: 2 % (ref 0–5)
HCT: 28.3 % — ABNORMAL LOW (ref 39.0–52.0)
HEMOGLOBIN: 9.6 g/dL — AB (ref 13.0–17.0)
Lymphocytes Relative: 16 % (ref 12–46)
Lymphs Abs: 1.3 10*3/uL (ref 0.7–4.0)
MCH: 33.6 pg (ref 26.0–34.0)
MCHC: 33.9 g/dL (ref 30.0–36.0)
MCV: 99 fL (ref 78.0–100.0)
Monocytes Absolute: 0.8 10*3/uL (ref 0.1–1.0)
Monocytes Relative: 9 % (ref 3–12)
NEUTROS PCT: 73 % (ref 43–77)
Neutro Abs: 6 10*3/uL (ref 1.7–7.7)
PLATELETS: 221 10*3/uL (ref 150–400)
RBC: 2.86 MIL/uL — AB (ref 4.22–5.81)
RDW: 11.9 % (ref 11.5–15.5)
WBC: 8.2 10*3/uL (ref 4.0–10.5)

## 2014-09-13 LAB — URIC ACID: Uric Acid, Serum: 10.8 mg/dL — ABNORMAL HIGH (ref 4.0–7.8)

## 2014-09-13 NOTE — Progress Notes (Signed)
Michael Cervantes  MRN: [id number] DOB/Age: 10-10-1945 69 y.o. Physician: Rada Hay Procedure:       Subjective: Patient reports less pain today however has not been out of bed yet. He also states it  looks less swollen and red  Vital Signs Temp:  [98.1 F (36.7 C)-99.3 F (37.4 C)] 98.9 F (37.2 C) (02/17 0745) Pulse Rate:  [68-111] 71 (02/17 0745) Resp:  [18-26] 18 (02/17 0745) BP: (79-111)/(46-67) 109/67 mmHg (02/17 0745) SpO2:  [95 %-100 %] 100 % (02/17 0745) Weight:  [82.5 kg (181 lb 14.1 oz)-85 kg (187 lb 6.3 oz)] 85 kg (187 lb 6.3 oz) (02/17 0500)  Lab Results  Recent Labs  09/12/14 1234 09/12/14 1528 09/13/14 0303  WBC 11.8*  --  8.2  HGB 11.5* 11.6* 9.6*  HCT 34.1* 34.0* 28.3*  PLT 258  --  221   BMET  Recent Labs  09/12/14 1515 09/12/14 1528 09/13/14 0303  NA 138 137 139  K 4.7 4.5 4.3  CL 104 105 110  CO2 20  --  19  GLUCOSE 105* 105* 106*  BUN 77* 81* 66*  CREATININE 3.33* 3.30* 2.68*  CALCIUM 10.1  --  9.2   No results found for: INR   Exam Right foot and ankle with previous skin markings of ink outlining previous erythema margins. This appears improved and he is beginning to show some of his skin creases returning. Less pain with rom certainly no fluctuance        Plan Encouraged him to increase his active ROM exercises. His exam suggests resolving cellulitis and appears to be headed in the right direction. Continue supportive care  Mayanna Garlitz for Dr.Kevin Supple 09/13/2014, 8:44 AM

## 2014-09-13 NOTE — Progress Notes (Signed)
Arrived to 6[address] from [place], wife at bedside, oriented to room and surroundings, denies nausea, c/o 3/10 right foot pain.

## 2014-09-13 NOTE — Clinical Documentation Improvement (Signed)
H&P 09/12/14 notes "Acute on chronic kidney disease in setting of polycystic kidney disease..Oncology admit Cr to 3.30 (reported b/l around 2.0)."  Please clarify the acute  condition and stage of CKD in your progress note and carry over to the discharge summary. (current renal labs below)  Component      BUN Creatinine  Latest Ref Rng      6 - 23 mg/dL 0.50 - 1.35 mg/dL  09/12/2014     3:15 PM 77 (H) 3.33 (H)  09/12/2014     3:28 PM 81 (H) 3.30 (H)  09/13/2014      66 (H) 2.68 (H)   Component      GFR calc non Af Amer  Latest Ref Rng      >90 mL/min  09/12/2014     3:15 PM 17 (L)  09/13/2014      23 (L)   Possible Clinical Conditions: -Acute kidney injury / acute renal failure on chronic kidney disease -Other (please specify) -Unable to determine at present  Stage of CKD: --Chronic kidney disease, stage 1- GFR > OR = 90 --Chronic kidney disease, stage 2 (mild) - GFR 60-89 --Chronic kidney disease, stage 3 (moderate) - GFR 30-59 --Chronic kidney disease, stage 4 (severe) - GFR 15-29  Thank you, Mateo Flow, RN [phone] Clinical Documentation Specialist

## 2014-09-13 NOTE — Progress Notes (Signed)
Attempted to call report. Nurse busy and will call back. Leanne Chang, RN

## 2014-09-13 NOTE — Progress Notes (Signed)
Family Medicine Teaching Service Daily Progress Note Intern Pager: [phone]  Patient name: Michael Cervantes Medical record number: [id number] Date of birth: September 02, 1945 Age: 69 y.o. Gender: male  Primary Care Provider: Redge Gainer, MD Consultants: Ortho Code Status: FULL  Pt Overview and Major Events to Date:  2/16: admitted for rt foot/ankle pain w/ erythema and edema  Assessment and Plan: Michael Cervantes is a 69 y.o. male presenting with worsening Right ankle/foot pain now with redness, swelling, warmth for past 4 days, suspected cellulitis with concern for potential septic R-ankle joint given recent reported intraarticular steroid injection at Ortho office. PMH is significant for Polycystic Kidney Disease, h/o Gout.  # Sepsis criteria on admission; secondary to Right Ankle/Foot cellulitis with concern for potential Septic Right Ankle Joint -- no longer meets sepsis criteria.  - hypotensive BP on admission, responded to IVF, with source of Right ankle/foot cellulitis. Concern for possible septic arthritis R-ankle (given recent steroid injection into R-ankle joint).  - On admission WBC 11.8, blood cultures collected - Vanc (MRSA coverage) / Cefepime per pharm; continue - Right ankle/foot marked in ED, follow-up cellulitis progression - Fentanyl 64mcg IV q 2 hr PRN pain (cautious with hypotension) - Follow-up blood cultures (collected prior to abx) - Right ankle/foot X-ray with probable ankle joint effusion, no evidence of osteo - Consulted Orthopedics  # Cellulitis vs. Gout vs. Septic joint: Ortho consulted, not convinced this is septic arthritis. Erythema resolving on IV abx. Uric Acid 10.8 which suggests gout, but less likely w/ such acute response to abx.  - See  Above - continue abx  # Hypotension - improved, asymptomatic - BP on admit 79/46, improved to 108/61 s/p 2L IVF - transfer out of stepdown - Cont IVF  # Acute kidney injury / acute renal failure on chronic kidney disease  in setting of Polycystic Kidney Disease - Followed by [facility], call tomorrow to notify that pt admitted - On admit Cr to 3.30 (reported b/l around 2.0) >> 2.68 (2/17) - IVF - Follow-up BMET - Hold home Lasix, Lisinopril, Metoprolol given hypotension on admission - If extended admission, may resume these medications pending clinical improvement  # H/o Gout - Remote history of prior gout flares in bilateral great toes; Rt 3rd LE digit - Not on uric acid lowering medication at home -- will likely DC on Uric Acid lowering medication  FEN/GI: NS @ 150cc/hr x 24 hours / Heart healthy Prophylaxis: SQ Heparin  Disposition: home when medically stable  Subjective:  Patient states foot/ankle much improved. Still painful. Noticing some additional pain starting in Rt Knee. No other complaints at this time.  Objective: Temp:  [97.1 F (36.2 C)-99.3 F (37.4 C)] 97.1 F (36.2 C) (02/17 1249) Pulse Rate:  [68-100] 78 (02/17 1249) Resp:  [18-26] 24 (02/17 1249) BP: (79-111)/(46-71) 102/71 mmHg (02/17 1249) SpO2:  [95 %-100 %] 100 % (02/17 1249) Weight:  [181 lb 14.1 oz (82.5 kg)-187 lb 6.3 oz (85 kg)] 187 lb 6.3 oz (85 kg) (02/17 0500) Physical Exam: General: laying in bed, appears comfortable, NAD HEENT: NCAT, PERRL, EOMI, oropharynx clear, MMM Cardiovascular: RRR, S1, S2 heard, no murmurs Lungs - CTAB, no wheezing, crackles, or rhonchi. Non-labored. Speaks full sentences. Abd - soft, NTND, no masses, +active BS MSK / Ext - Rt knee minimally tender, preserved ROM, slight effusion, warm to touch compared to Left. Significant for mild ankle and midfoot edema with localized IMPROVING mild erythema (marked in ED) mostly dorsal-medial mid foot, also involving Right lateral  malleolus (also improving). Significantly diminished dorsiflexion/plantarflexion ROM 2/2 pain. Left ankle normal appearing w/o erythema or edema. Left 3rd toe appears erythematous/edematous/ non-tender. Peripheral pulses +2  b/l Skin - warm, dry, no rashes Neuro - awake, alert, oriented x3, grossly non-focal, intact distal sensation to light touch, unable to test ambulation in ED (reported pain significantly worse with weight bearin  Laboratory:  Recent Labs Lab 09/12/14 1234 09/12/14 1528 09/13/14 0303  WBC 11.8*  --  8.2  HGB 11.5* 11.6* 9.6*  HCT 34.1* 34.0* 28.3*  PLT 258  --  221    Recent Labs Lab 09/12/14 1515 09/12/14 1528 09/13/14 0303  NA 138 137 139  K 4.7 4.5 4.3  CL 104 105 110  CO2 20  --  19  BUN 77* 81* 66*  CREATININE 3.33* 3.30* 2.68*  CALCIUM 10.1  --  9.2  PROT 7.4  --   --   BILITOT 1.0  --   --   ALKPHOS 101  --   --   ALT 18  --   --   AST 20  --   --   GLUCOSE 105* 105* 106*   Uric Acid: 10.8  Imaging/Diagnostic Tests: XR Rt Ankle 2/16 IMPRESSION: Probable ankle joint effusion. No acute findings otherwise.  CXR 2/16 IMPRESSION: No acute cardiopulmonary abnormality.   Elberta Leatherwood, MD 09/13/2014, 1:50 PM PGY-1, [place] Intern pager: [phone], text pages welcome

## 2014-09-14 LAB — BASIC METABOLIC PANEL
Anion gap: 8 (ref 5–15)
BUN: 42 mg/dL — AB (ref 6–23)
CO2: 21 mmol/L (ref 19–32)
CREATININE: 2.02 mg/dL — AB (ref 0.50–1.35)
Calcium: 9.7 mg/dL (ref 8.4–10.5)
Chloride: 109 mmol/L (ref 96–112)
GFR calc Af Amer: 37 mL/min — ABNORMAL LOW (ref >90)
GFR calc non Af Amer: 32 mL/min — ABNORMAL LOW (ref >90)
Glucose, Bld: 103 mg/dL — ABNORMAL HIGH (ref 70–99)
Potassium: 4.5 mmol/L (ref 3.5–5.1)
Sodium: 138 mmol/L (ref 135–145)

## 2014-09-14 LAB — CBC WITH DIFFERENTIAL/PLATELET
BASOS ABS: 0 10*3/uL (ref 0.0–0.1)
BASOS PCT: 0 % (ref 0–1)
EOS ABS: 0.1 10*3/uL (ref 0.0–0.7)
Eosinophils Relative: 1 % (ref 0–5)
HEMATOCRIT: 29.9 % — AB (ref 39.0–52.0)
Hemoglobin: 10.1 g/dL — ABNORMAL LOW (ref 13.0–17.0)
Lymphocytes Relative: 8 % — ABNORMAL LOW (ref 12–46)
Lymphs Abs: 0.7 10*3/uL (ref 0.7–4.0)
MCH: 32.9 pg (ref 26.0–34.0)
MCHC: 33.8 g/dL (ref 30.0–36.0)
MCV: 97.4 fL (ref 78.0–100.0)
Monocytes Absolute: 1.1 10*3/uL — ABNORMAL HIGH (ref 0.1–1.0)
Monocytes Relative: 11 % (ref 3–12)
NEUTROS ABS: 7.6 10*3/uL (ref 1.7–7.7)
NEUTROS PCT: 80 % — AB (ref 43–77)
PLATELETS: 267 10*3/uL (ref 150–400)
RBC: 3.07 MIL/uL — ABNORMAL LOW (ref 4.22–5.81)
RDW: 12.1 % (ref 11.5–15.5)
WBC: 9.5 10*3/uL (ref 4.0–10.5)

## 2014-09-14 MED ORDER — DOXYCYCLINE HYCLATE 100 MG PO TABS
100.0000 mg | ORAL_TABLET | Freq: Two times a day (BID) | ORAL | Status: DC
  Administered 2014-09-14 – 2014-09-15 (×3): 100 mg via ORAL
  Filled 2014-09-14 (×3): qty 1

## 2014-09-14 MED ORDER — COLCHICINE 0.6 MG PO TABS
0.6000 mg | ORAL_TABLET | Freq: Every day | ORAL | Status: DC
  Administered 2014-09-14 – 2014-09-15 (×2): 0.6 mg via ORAL
  Filled 2014-09-14 (×2): qty 1

## 2014-09-14 NOTE — Progress Notes (Signed)
Family Medicine Teaching Service Daily Progress Note Intern Pager: [phone]  Patient name: Michael Cervantes Medical record number: [id number] Date of birth: 1946-01-04 Age: 69 y.o. Gender: male  Primary Care Provider: Redge Gainer, MD Consultants: Ortho Code Status: FULL  Pt Overview and Major Events to Date:  2/16: admitted for rt foot/ankle pain w/ erythema and edema  Assessment and Plan: Michael Cervantes is a 69 y.o. male presenting with worsening Right ankle/foot pain now with redness, swelling, warmth for past 4 days, suspected cellulitis with concern for potential septic R-ankle joint given recent reported intraarticular steroid injection at Ortho office. PMH is significant for Polycystic Kidney Disease, h/o Gout.  # Sepsis criteria on admission; secondary to Right Ankle/Foot cellulitis with concern for potential Septic Right Ankle Joint -- no longer meets sepsis criteria.  - hypotensive BP on admission, responded to IVF, with source of Right ankle/foot cellulitis. Concern for possible septic arthritis R-ankle (given recent steroid injection into R-ankle joint).  - On admission WBC 11.8, blood cultures collected - 2/18 WBC 9.5 - Vanc (MRSA coverage) / Cefepime per pharm; DC -- Start Doxycycline - Right ankle/foot marked in ED; Significant improvement in cellulitis - Fentanyl 94mcg IV q 2 hr PRN pain (cautious with hypotension) - Follow-up blood cultures (collected prior to abx) - Right ankle/foot X-ray with probable ankle joint effusion, no evidence of osteo - Consulted Orthopedics  # Cellulitis vs. Gout vs. Septic joint: Ortho consulted, not convinced this is septic arthritis. Erythema resolving on IV abx. Uric Acid 10.8 which suggests gout, but less likely w/ such acute response to abx.  - See  Above - continue abx - switch to PO 2/17  # Hypotension - improved, asymptomatic - BP on admit 79/46, improved to 108/61 s/p 2L IVF - transfer out of stepdown - Cont IVF  # Acute kidney  injury / acute renal failure on chronic kidney disease in setting of Polycystic Kidney Disease - Followed by [facility], call tomorrow to notify that pt admitted - On admit Cr to 3.30 (reported b/l around 2.0) >> 2.02 (2/18) - IVF - Follow-up BMET - Hold home Lasix, Lisinopril, Metoprolol given hypotension on admission - If extended admission, may resume these medications pending clinical improvement  # H/o Gout - Remote history of prior gout flares in bilateral great toes; Rt 3rd LE digit - Not on uric acid lowering medication at home -- will DC on Uric Acid lowering medication - Starting Colchicine 2/18; Allopurinol to begin 2/19 - Rt Knee aspiration later today for suspected gouty flare.    FEN/GI: NS @ 150cc/hr x 24 hours / Heart healthy Prophylaxis: SQ Heparin  Disposition: home when medically stable  Subjective:  Patient states foot/ankle much improved. Still painful. Rt Knee much worse than yesterday. No other complaints at this time.  Objective: Temp:  [97.1 F (36.2 C)-99.6 F (37.6 C)] 99 F (37.2 C) (02/18 0517) Pulse Rate:  [66-91] 66 (02/18 0517) Resp:  [14-24] 16 (02/18 0517) BP: (83-127)/(43-87) 98/58 mmHg (02/18 0535) SpO2:  [93 %-100 %] 99 % (02/18 0517) Weight:  [191 lb 12.8 oz (87 kg)] 191 lb 12.8 oz (87 kg) (02/18 0517) Physical Exam: General: laying in bed, appears comfortable, NAD HEENT: NCAT, PERRL, EOMI, oropharynx clear, MMM Cardiovascular: RRR, S1, S2 heard, no murmurs Lungs - CTAB, no wheezing, crackles, or rhonchi. Non-labored. Speaks full sentences. Abd - soft, NTND, no masses, +active BS MSK / Ext - Rt knee moderately tender, preserved ROM, effusion present, warm to touch compared  to Left. Significant for mild ankle and midfoot edema with localized IMPROVING mild erythema (marked in ED) mostly dorsal-medial mid foot, also involving Right lateral malleolus (also improving). Significantly diminished dorsiflexion/plantarflexion ROM 2/2 pain. Left  ankle normal appearing w/o erythema or edema. Left 3rd toe appears erythematous/edematous/ non-tender. Peripheral pulses +2 b/l Skin - warm, dry, no rashes Neuro - awake, alert, oriented x3, grossly non-focal, intact distal sensation to light touch, unable to test ambulation in ED (reported pain significantly worse with weight bearin  Laboratory:  Recent Labs Lab 09/12/14 1234 09/12/14 1528 09/13/14 0303 09/14/14 1040  WBC 11.8*  --  8.2 9.5  HGB 11.5* 11.6* 9.6* 10.1*  HCT 34.1* 34.0* 28.3* 29.9*  PLT 258  --  221 267    Recent Labs Lab 09/12/14 1515 09/12/14 1528 09/13/14 0303 09/14/14 1040  NA 138 137 139 138  K 4.7 4.5 4.3 4.5  CL 104 105 110 109  CO2 20  --  19 21  BUN 77* 81* 66* 42*  CREATININE 3.33* 3.30* 2.68* 2.02*  CALCIUM 10.1  --  9.2 9.7  PROT 7.4  --   --   --   BILITOT 1.0  --   --   --   ALKPHOS 101  --   --   --   ALT 18  --   --   --   AST 20  --   --   --   GLUCOSE 105* 105* 106* 103*   Uric Acid: 10.8  Imaging/Diagnostic Tests: XR Rt Ankle 2/16 IMPRESSION: Probable ankle joint effusion. No acute findings otherwise.  CXR 2/16 IMPRESSION: No acute cardiopulmonary abnormality.   Elberta Leatherwood, MD 09/14/2014, 12:47 PM PGY-1, [place] Intern pager: [phone], text pages welcome

## 2014-09-15 DIAGNOSIS — M10361 Gout due to renal impairment, right knee: Secondary | ICD-10-CM

## 2014-09-15 LAB — BASIC METABOLIC PANEL
ANION GAP: 4 — AB (ref 5–15)
BUN: 34 mg/dL — ABNORMAL HIGH (ref 6–23)
CO2: 23 mmol/L (ref 19–32)
Calcium: 9.4 mg/dL (ref 8.4–10.5)
Chloride: 111 mmol/L (ref 96–112)
Creatinine, Ser: 1.83 mg/dL — ABNORMAL HIGH (ref 0.50–1.35)
GFR calc Af Amer: 42 mL/min — ABNORMAL LOW (ref >90)
GFR, EST NON AFRICAN AMERICAN: 36 mL/min — AB (ref >90)
GLUCOSE: 97 mg/dL (ref 70–99)
POTASSIUM: 4.3 mmol/L (ref 3.5–5.1)
Sodium: 138 mmol/L (ref 135–145)

## 2014-09-15 LAB — CBC WITH DIFFERENTIAL/PLATELET
Basophils Absolute: 0 10*3/uL (ref 0.0–0.1)
Basophils Relative: 0 % (ref 0–1)
EOS PCT: 2 % (ref 0–5)
Eosinophils Absolute: 0.2 10*3/uL (ref 0.0–0.7)
HEMATOCRIT: 28 % — AB (ref 39.0–52.0)
Hemoglobin: 9.6 g/dL — ABNORMAL LOW (ref 13.0–17.0)
LYMPHS ABS: 1.2 10*3/uL (ref 0.7–4.0)
LYMPHS PCT: 14 % (ref 12–46)
MCH: 33.1 pg (ref 26.0–34.0)
MCHC: 34.3 g/dL (ref 30.0–36.0)
MCV: 96.6 fL (ref 78.0–100.0)
MONO ABS: 0.9 10*3/uL (ref 0.1–1.0)
Monocytes Relative: 10 % (ref 3–12)
Neutro Abs: 6.1 10*3/uL (ref 1.7–7.7)
Neutrophils Relative %: 74 % (ref 43–77)
PLATELETS: 231 10*3/uL (ref 150–400)
RBC: 2.9 MIL/uL — ABNORMAL LOW (ref 4.22–5.81)
RDW: 11.8 % (ref 11.5–15.5)
WBC: 8.3 10*3/uL (ref 4.0–10.5)

## 2014-09-15 LAB — SYNOVIAL CELL COUNT + DIFF, W/ CRYSTALS
Eosinophils-Synovial: 0 % (ref 0–1)
Lymphocytes-Synovial Fld: 3 % (ref 0–20)
Monocyte-Macrophage-Synovial Fluid: 6 % — ABNORMAL LOW (ref 50–90)
Neutrophil, Synovial: 91 % — ABNORMAL HIGH (ref 0–25)
WBC, Synovial: 23250 /mm3 — ABNORMAL HIGH (ref 0–200)

## 2014-09-15 MED ORDER — COLCHICINE 0.6 MG PO TABS: 0.6000 mg | ORAL_TABLET | Freq: Every day | ORAL | Status: DC

## 2014-09-15 MED ORDER — LIDOCAINE HCL (PF) 1 % IJ SOLN
30.0000 mL | Freq: Once | INTRAMUSCULAR | Status: AC
  Administered 2014-09-15: 30 mL

## 2014-09-15 MED ORDER — LIDOCAINE HCL (PF) 1 % IJ SOLN: 5.0000 mL | Freq: Once | INTRAMUSCULAR | Status: DC

## 2014-09-15 MED ORDER — DOXYCYCLINE HYCLATE 100 MG PO TABS: 100.0000 mg | ORAL_TABLET | Freq: Two times a day (BID) | ORAL | Status: DC

## 2014-09-15 MED ORDER — ALLOPURINOL 100 MG PO TABS: 200.0000 mg | ORAL_TABLET | Freq: Every day | ORAL | Status: DC

## 2014-09-15 MED ORDER — ALLOPURINOL 100 MG PO TABS
200.0000 mg | ORAL_TABLET | Freq: Every day | ORAL | Status: DC
  Administered 2014-09-15: 200 mg via ORAL
  Filled 2014-09-15: qty 2

## 2014-09-15 MED ORDER — METHYLPREDNISOLONE ACETATE 40 MG/ML IJ SUSP
40.0000 mg | Freq: Once | INTRAMUSCULAR | Status: AC
  Administered 2014-09-15: 40 mg via INTRA_ARTICULAR
  Filled 2014-09-15: qty 1

## 2014-09-15 NOTE — Progress Notes (Signed)
Michael Cervantes  MRN: [id number] DOB/Age: 11/13/1945 69 y.o. Physician: Rada Hay Procedure:  rIGHT KNEE ASPIRATION     Subjective: PATIENT WITH IMPROVEMENTS IN RIGHT ANKLE PAIN AND SWELLING . NOW with increased pain and swelling to right knee. Medicine service was going to aspirate knee however has not been performed yet. Patient has never had aspirate documented confirmation for gout diagnosis.  Vital Signs Temp:  [97.9 F (36.6 C)-98.8 F (37.1 C)] 97.9 F (36.6 C) (02/19 0513) Pulse Rate:  [67-76] 70 (02/19 0513) Resp:  [16] 16 (02/19 0513) BP: (107-118)/(64-76) 117/68 mmHg (02/19 0513) SpO2:  [98 %-99 %] 98 % (02/19 0513) Weight:  [86.9 kg (191 lb 9.3 oz)] 86.9 kg (191 lb 9.3 oz) (02/19 0600)  Lab Results  Recent Labs  09/14/14 1040 09/15/14 0604  WBC 9.5 8.3  HGB 10.1* 9.6*  HCT 29.9* 28.0*  PLT 267 231   BMET  Recent Labs  09/13/14 0303 09/14/14 1040  NA 139 138  K 4.3 4.5  CL 110 109  CO2 19 21  GLUCOSE 106* 103*  BUN 66* 42*  CREATININE 2.68* 2.02*  CALCIUM 9.2 9.7   No results found for: INR   Exam  Exam of right foot anle shows almost complete resolution to erythema and much less swelling.   Right knee with an obvious moderate effusion. No obvious erythema.  Significant pain with attempts at motion.   Procedure:  After informed consent A superior lateral portal was anesthetized with 1% lidocaine Approximately 60 cc of cloudy appearing yellow joint fluid with a little particulate matter was aspirated. Fluid had consistency of inflammatory arthropathy A combination of 8 cc lidocaine and 40 mg depo medrol was instilled back into joint after aspiration performed        Plan Joint fluid was sent to lab for crystal and fluid analysis however it certainly had the appearance of gout. Patient looks much more comfortable and with this injection hopefully will get some symptomatic relief and  Medicine has already discussed other gout  modifying drugs with him. From our standpoint this can certainly be managed by his outpatient primary care provider, we will sign off for now.  Jesiel Garate for Dr.Kevin Supple 09/15/2014, 9:53 AM

## 2014-09-15 NOTE — Discharge Instructions (Signed)
An appointment has been set up with your PCP Dr. Laurance Flatten on Thursday 09/21/14 for a hospital follow-up.   Gout Gout is an inflammatory arthritis caused by a buildup of uric acid crystals in the joints. Uric acid is a chemical that is normally present in the blood. When the level of uric acid in the blood is too high it can form crystals that deposit in your joints and tissues. This causes joint redness, soreness, and swelling (inflammation). Repeat attacks are common. Over time, uric acid crystals can form into masses (tophi) near a joint, destroying bone and causing disfigurement. Gout is treatable and often preventable. CAUSES  The disease begins with elevated levels of uric acid in the blood. Uric acid is produced by your body when it breaks down a naturally found substance called purines. Certain foods you eat, such as meats and fish, contain high amounts of purines. Causes of an elevated uric acid level include:  Being passed down from parent to child (heredity).  Diseases that cause increased uric acid production (such as obesity, psoriasis, and certain cancers).  Excessive alcohol use.  Diet, especially diets rich in meat and seafood.  Medicines, including certain cancer-fighting medicines (chemotherapy), water pills (diuretics), and aspirin.  Chronic kidney disease. The kidneys are no longer able to remove uric acid well.  Problems with metabolism. Conditions strongly associated with gout include:  Obesity.  High blood pressure.  High cholesterol.  Diabetes. Not everyone with elevated uric acid levels gets gout. It is not understood why some people get gout and others do not. Surgery, joint injury, and eating too much of certain foods are some of the factors that can lead to gout attacks. SYMPTOMS   An attack of gout comes on quickly. It causes intense pain with redness, swelling, and warmth in a joint.  Fever can occur.  Often, only one joint is involved. Certain joints are  more commonly involved:  Base of the big toe.  Knee.  Ankle.  Wrist.  Finger. Without treatment, an attack usually goes away in a few days to weeks. Between attacks, you usually will not have symptoms, which is different from many other forms of arthritis. DIAGNOSIS  Your caregiver will suspect gout based on your symptoms and exam. In some cases, tests may be recommended. The tests may include:  Blood tests.  Urine tests.  X-rays.  Joint fluid exam. This exam requires a needle to remove fluid from the joint (arthrocentesis). Using a microscope, gout is confirmed when uric acid crystals are seen in the joint fluid. TREATMENT  There are two phases to gout treatment: treating the sudden onset (acute) attack and preventing attacks (prophylaxis).  Treatment of an Acute Attack.  Medicines are used. These include anti-inflammatory medicines or steroid medicines.  An injection of steroid medicine into the affected joint is sometimes necessary.  The painful joint is rested. Movement can worsen the arthritis.  You may use warm or cold treatments on painful joints, depending which works best for you.  Treatment to Prevent Attacks.  If you suffer from frequent gout attacks, your caregiver may advise preventive medicine. These medicines are started after the acute attack subsides. These medicines either help your kidneys eliminate uric acid from your body or decrease your uric acid production. You may need to stay on these medicines for a very long time.  The early phase of treatment with preventive medicine can be associated with an increase in acute gout attacks. For this reason, during the first few months  of treatment, your caregiver may also advise you to take medicines usually used for acute gout treatment. Be sure you understand your caregiver's directions. Your caregiver may make several adjustments to your medicine dose before these medicines are effective.  Discuss dietary  treatment with your caregiver or dietitian. Alcohol and drinks high in sugar and fructose and foods such as meat, poultry, and seafood can increase uric acid levels. Your caregiver or dietitian can advise you on drinks and foods that should be limited. HOME CARE INSTRUCTIONS   Do not take aspirin to relieve pain. This raises uric acid levels.  Only take over-the-counter or prescription medicines for pain, discomfort, or fever as directed by your caregiver.  Rest the joint as much as possible. When in bed, keep sheets and blankets off painful areas.  Keep the affected joint raised (elevated).  Apply warm or cold treatments to painful joints. Use of warm or cold treatments depends on which works best for you.  Use crutches if the painful joint is in your leg.  Drink enough fluids to keep your urine clear or pale yellow. This helps your body get rid of uric acid. Limit alcohol, sugary drinks, and fructose drinks.  Follow your dietary instructions. Pay careful attention to the amount of protein you eat. Your daily diet should emphasize fruits, vegetables, whole grains, and fat-free or low-fat milk products. Discuss the use of coffee, vitamin C, and cherries with your caregiver or dietitian. These may be helpful in lowering uric acid levels.  Maintain a healthy body weight. SEEK MEDICAL CARE IF:   You develop diarrhea, vomiting, or any side effects from medicines.  You do not feel better in 24 hours, or you are getting worse. SEEK IMMEDIATE MEDICAL CARE IF:   Your joint becomes suddenly more tender, and you have chills or a fever. MAKE SURE YOU:   Understand these instructions.  Will watch your condition.  Will get help right away if you are not doing well or get worse. Document Released: 07/11/2000 Document Revised: 11/28/2013 Document Reviewed: 02/25/2012 [facility] Patient Information 2015 [place], [place]. This information is not intended to replace advice given to you by your health  care provider. Make sure you discuss any questions you have with your health care provider.

## 2014-09-15 NOTE — Discharge Summary (Signed)
[facility] Discharge Summary  Patient name: Michael Cervantes Medical record number: [id number] Date of birth: [birth year] Age: 69 y.o. Gender: male Date of Admission: 09/12/2014  Date of Discharge: 09/15/2014  Admitting Physician: Lind Covert, MD  Primary Care Provider: Redge Gainer, MD Consultants: Orthopaedics  Indication for Hospitalization:  Right ankle/foot pain/cellulitis Sepsis  Discharge Diagnoses/Problem List:  Acute gouty flare; right knee/ankle and left 3rd toe Cellulitis, right foot Sepsis   Disposition: home  Discharge Condition: stable  Discharge Exam:  General: laying in bed, appears comfortable, NAD HEENT: NCAT, PERRL, EOMI, oropharynx clear, MMM Cardiovascular: RRR, S1, S2 heard, no murmurs Lungs - CTAB, no wheezing, crackles, or rhonchi. Non-labored. Speaks full sentences. Abd - soft, NTND, no masses, +active BS MSK / Ext - Rt knee moderately tender, preserved ROM, effusion present, warm to touch compared to Left. Significant for mild ankle and midfoot edema with localized improved erythema (marked in ED) of dorsal-medial mid foot, right lateral malleolus. Diminished dorsiflexion/plantarflexion ROM, but significantly improved from admission. Left ankle normal appearing w/o erythema or edema. Left 3rd toe appears erythematous/edematous/ non-tender. Peripheral pulses +2 b/l Skin - warm, dry, no rashes Neuro - awake, alert, oriented x3, grossly non-focal, intact distal sensation to light touch, unable to test ambulation in ED (reported pain significantly worse with weight bearin  Brief Hospital Course:  Michael Cervantes is a 69 y.o. male presented with Right ankle and foot pain, swelling, redness. Symptoms significantly worsened about 4 days prior to admission causing him to seek immediate treatment in ED. Recent significant history with Right ankle pain that started about 1 month ago without inciting injury, admits to chronic history  of Right ankle pain over past few years but nothing significant. He was seen about 1 month ago for this same complaint by PCP suspected possible gout at that time, treated with prednisone dose pak without significant improvement. Followed up on 08/24/14 with PCP given no improvement, ordered Right ankle X-ray, identified arthritis and referred to [facility]. Seen recently within past week at [facility] by Dr. Drema Dallas, at that time given R-ankle steroid injection for pain thought due to osteoarthritis. Improved following injection for few days, then significant worsening about 4 days prior to admission with redness, warmth, return of swelling (previously improved), and pain. Pain worst with weight bearing, patient reported limited ambulation.  In ED, had low BP with 79/46 given 2L IVF boluses with notable improvement to SBP 100s. Initial work-up with WBC 11.8, blood cultures collected and started on antibiotics (Vanc / Cefepime), admitted to SDU.  Patient seen by ortho the following day. Deemed non-concerning for septic arthritis. Cellulitis over right anke with drastic improvement over following 48hrs. Right knee involvement noted on day prior to admission. Significant effusion present. Patient started on Colchicine. IV abx DCd on 2/18 as well--started on PO doxycycline. Right knee aspiration and steroid injection performed 2/19 by ortho PA. Synovial fluid studies sent.  Patient was deemed medically stable for DC.  Issues for Follow Up:  1. Started Colchicine. Patient is to take this for the next 3 months. No NSAIDs due to renal function. 2. Started Allopurinol @200mg  QD (Addendum: patient was called and asked to take only 100mg  Allopurinol at DC, instead of 200mg --patient stated understanding of this. Starting dose of 200mg  placed in error, please advise). Reduced dosing due to renal function.  3. Recommend recheck renal function at f/u appt.  4. DC on PO antibiotics for cellulitis. Patient is to  take until  completion.  Significant Procedures: Right knee aspiration via ortho.  Significant Labs and Imaging:   Recent Labs Lab 09/13/14 0303 09/14/14 1040 09/15/14 0604  WBC 8.2 9.5 8.3  HGB 9.6* 10.1* 9.6*  HCT 28.3* 29.9* 28.0*  PLT 221 267 231    Recent Labs Lab 09/12/14 1515 09/12/14 1528 09/13/14 0303 09/14/14 1040 09/15/14 0604  NA 138 137 139 138 138  K 4.7 4.5 4.3 4.5 4.3  CL 104 105 110 109 111  CO2 20  --  19 21 23   GLUCOSE 105* 105* 106* 103* 97  BUN 77* 81* 66* 42* 34*  CREATININE 3.33* 3.30* 2.68* 2.02* 1.83*  CALCIUM 10.1  --  9.2 9.7 9.4  ALKPHOS 101  --   --   --   --   AST 20  --   --   --   --   ALT 18  --   --   --   --   ALBUMIN 3.5  --   --   --   --    Uric Acid - 10.8  Results/Tests Pending at Time of Discharge: synovial fluid culture/protein/glucose  Discharge Medications:    Medication List    STOP taking these medications        azithromycin 250 MG tablet  Commonly known as:  ZITHROMAX     methylPREDNIsolone 4 MG tablet  Commonly known as:  MEDROL DOSPACK      TAKE these medications        allopurinol 100 MG tablet  Commonly known as:  ZYLOPRIM  Take 2 tablets (200 mg total) by mouth daily.     benzonatate 100 MG capsule  Commonly known as:  TESSALON PERLES  Take 2 capsules (200 mg total) by mouth 3 (three) times daily as needed.     colchicine 0.6 MG tablet  Take 1 tablet (0.6 mg total) by mouth daily.     doxycycline 100 MG tablet  Commonly known as:  VIBRA-TABS  Take 1 tablet (100 mg total) by mouth every 12 (twelve) hours.     fluocinonide 0.05 % external solution  Commonly known as:  LIDEX  Apply 1 application topically 2 (two) times daily.     furosemide 40 MG tablet  Commonly known as:  LASIX  Take 40 mg by mouth daily.     hydrochlorothiazide 25 MG tablet  Commonly known as:  HYDRODIURIL  Take 25 mg by mouth daily.     HYDROcodone-acetaminophen 5-325 MG per tablet  Commonly known as:  NORCO  Take  1 tablet by mouth every 6 (six) hours as needed for moderate pain.     lisinopril 10 MG tablet  Commonly known as:  PRINIVIL,ZESTRIL  Take 10 mg by mouth daily.     lovastatin 10 MG tablet  Commonly known as:  MEVACOR  Take 10 mg by mouth at bedtime.     lovastatin 40 MG tablet  Commonly known as:  MEVACOR  Take 40 mg by mouth at bedtime.        Discharge Instructions: Please refer to Patient Instructions section of EMR for full details.  Patient was counseled important signs and symptoms that should prompt return to medical care, changes in medications, dietary instructions, activity restrictions, and follow up appointments.   Follow-Up Appointments:     Follow-up Information    Follow up with Redge Gainer, MD. Go on 09/21/2014.   Specialty:  Family Medicine   Why:  @2 :00pm   Contact information:  [place] [address] [phone]       Elberta Leatherwood, MD 09/15/2014, 11:52 AM PGY-1, [place]

## 2014-09-16 LAB — PROTEIN, SYNOVIAL FLUID: PROTEIN, SYNOVIAL FLUID: 2.3 g/dL (ref 1.0–3.0)

## 2014-09-16 LAB — GLUCOSE, SYNOVIAL FLUID: Glucose, Synovial Fluid: 53 mg/dL

## 2014-09-16 MED ORDER — ALLOPURINOL 100 MG PO TABS: 100.0000 mg | ORAL_TABLET | Freq: Every day | ORAL | Status: DC

## 2014-09-18 LAB — CULTURE, BLOOD (ROUTINE X 2)
CULTURE: NO GROWTH
Culture: NO GROWTH

## 2014-09-18 LAB — BODY FLUID CULTURE: Culture: NO GROWTH

## 2014-09-21 ENCOUNTER — Encounter: Payer: Self-pay | Admitting: Family Medicine

## 2014-09-21 ENCOUNTER — Ambulatory Visit (INDEPENDENT_AMBULATORY_CARE_PROVIDER_SITE_OTHER): Payer: Medicare Other | Admitting: Family Medicine

## 2014-09-21 VITALS — BP 97/72 | HR 125 | Temp 96.6°F | Ht 71.0 in | Wt 179.0 lb

## 2014-09-21 DIAGNOSIS — L03115 Cellulitis of right lower limb: Secondary | ICD-10-CM

## 2014-09-21 DIAGNOSIS — M1000 Idiopathic gout, unspecified site: Secondary | ICD-10-CM

## 2014-09-21 DIAGNOSIS — E785 Hyperlipidemia, unspecified: Secondary | ICD-10-CM

## 2014-09-21 NOTE — Progress Notes (Signed)
Subjective:    Patient ID: Michael Cervantes, male    DOB: Jul 04, 1946, 69 y.o.   MRN: [id number]  HPI Patient here today for hospital follow up. He was admitted at [facility] for cellulitis of right ankle and knee, hypotension, and cough. This patient has a history of gout and cellulitis of the ankle and knee and is doing better with both of these. He also sees a nephrologist in [place] because of his hypotension and anemia. He has a history of hyperlipidemia also. He is here today primarily as a hospital follow-up. Is in the hospital for 3 days. He is been seeing the nephrologist for over 10 years. His knee and ankle are better. And he is off the antibiotic.         Patient Active Problem List   Diagnosis Date Noted  . 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 Cough   . Cellulitis 09/12/2014  . Severe sepsis 09/12/2014  . Cellulitis of right foot   . Other specified hypotension    Outpatient Encounter Prescriptions as of 09/21/2014  Medication Sig  . allopurinol (ZYLOPRIM) 100 MG tablet Take 1 tablet (100 mg total) by mouth daily.  . colchicine 0.6 MG tablet Take 1 tablet (0.6 mg total) by mouth daily.  . furosemide (LASIX) 40 MG tablet Take 40 mg by mouth daily.  Marland Kitchen lisinopril (PRINIVIL,ZESTRIL) 10 MG tablet Take 10 mg by mouth daily.  Marland Kitchen lovastatin (MEVACOR) 40 MG tablet Take 40 mg by mouth at bedtime.  . [DISCONTINUED] benzonatate (TESSALON PERLES) 100 MG capsule Take 2 capsules (200 mg total) by mouth 3 (three) times daily as needed. (Patient not taking: Reported on 09/12/2014)  . [DISCONTINUED] doxycycline (VIBRA-TABS) 100 MG tablet Take 1 tablet (100 mg total) by mouth every 12 (twelve) hours.  . [DISCONTINUED] fluocinonide (LIDEX) 0.05 % external solution Apply 1 application topically 2 (two) times daily. (Patient not taking: Reported on 09/12/2014)  . [DISCONTINUED] hydrochlorothiazide (HYDRODIURIL) 25 MG tablet Take 25 mg by mouth daily.  . [DISCONTINUED]  HYDROcodone-acetaminophen (NORCO) 5-325 MG per tablet Take 1 tablet by mouth every 6 (six) hours as needed for moderate pain. (Patient not taking: Reported on 09/12/2014)  . [DISCONTINUED] lovastatin (MEVACOR) 10 MG tablet Take 10 mg by mouth at bedtime.    Review of Systems  Constitutional: Negative.   HENT: Negative.   Eyes: Negative.   Respiratory: Positive for cough (mostly gone).   Cardiovascular: Positive for leg swelling (redness is better , still soreness - right ankle and knee).  Gastrointestinal: Negative.   Endocrine: Negative.   Genitourinary: Negative.   Musculoskeletal: Negative.   Skin: Negative.   Allergic/Immunologic: Negative.   Neurological: Negative.   Hematological: Negative.   Psychiatric/Behavioral: Negative.        Objective:   Physical Exam  Constitutional: He is oriented to person, place, and time. He appears well-developed and well-nourished. No distress.  HENT:  Head: Normocephalic and atraumatic.  Mouth/Throat: No oropharyngeal exudate.  Eyes: Conjunctivae and EOM are normal. Pupils are equal, round, and reactive to light. Right eye exhibits no discharge. Left eye exhibits no discharge. No scleral icterus.  Neck: Normal range of motion. Neck supple.  Cardiovascular: Normal rate, regular rhythm, normal heart sounds and intact distal pulses.  Exam reveals no gallop and no friction rub.   No murmur heard. Pulmonary/Chest: Effort normal and breath sounds normal. No respiratory distress. He has no wheezes. He has no rales. He exhibits no tenderness.  Axillary  is negative  Abdominal: Soft. Bowel sounds are normal. He exhibits no mass. There is no tenderness. There is no rebound and no guarding.  No inguinal adenopathy  Musculoskeletal: Normal range of motion. He exhibits edema. He exhibits no tenderness.  There was minimal swelling and rubor of the right foot and ankle but no edema  Neurological: He is alert and oriented to person, place, and time.  Skin:  Skin is warm and dry. No rash noted. No erythema. No pallor.  Psychiatric: He has a normal mood and affect. His behavior is normal. Judgment and thought content normal.  Nursing note and vitals reviewed.  BP 97/72 mmHg  Pulse 125  Temp(Src) 96.6 F (35.9 C) (Oral)  Ht 5\' 11"  (1.803 m)  Wt 179 lb (81.194 kg)  BMI 24.98 kg/m2        Assessment & Plan:  1. Idiopathic gout, unspecified chronicity, unspecified site -Continue with current treatment  2. Cellulitis of right lower extremity -Cellulitis is much improved and antibiotic has been completed  3. Hyperlipidemia -Continue with lovastatin and aggressive therapeutic lifestyle changes  Patient Instructions  Continue regular follow-up with nephrology Continue medications as directed by the nephrologist Continue with low cholesterol diet-----check lipid and liver function at next visit   Arrie Senate MD

## 2014-09-21 NOTE — Patient Instructions (Signed)
Continue regular follow-up with nephrology Continue medications as directed by the nephrologist Continue with low cholesterol diet-----check lipid and liver function at next visit

## 2014-10-03 ENCOUNTER — Telehealth: Payer: Self-pay | Admitting: Family Medicine

## 2014-10-03 NOTE — Telephone Encounter (Signed)
Stp he has stopped taking his BP med due to the dizziness, he states even after stopping the BP med his BP has still been running 90's/60's. Advised pt to try and bend with his knees and not to bend at the back with head going lower than his chest also advised to stand slowly and pt given appt with Dr.Stacks 3/10 at 11:10. Pt aware to call if he has any problems or if condition worsens.

## 2014-10-05 ENCOUNTER — Telehealth: Payer: Self-pay | Admitting: Family Medicine

## 2014-10-05 ENCOUNTER — Encounter: Payer: Self-pay | Admitting: Family Medicine

## 2014-10-05 ENCOUNTER — Ambulatory Visit (INDEPENDENT_AMBULATORY_CARE_PROVIDER_SITE_OTHER): Payer: Medicare Other | Admitting: Family Medicine

## 2014-10-05 VITALS — BP 120/80 | HR 101 | Temp 97.9°F | Ht 71.0 in | Wt 178.0 lb

## 2014-10-05 DIAGNOSIS — I959 Hypotension, unspecified: Secondary | ICD-10-CM | POA: Diagnosis not present

## 2014-10-05 DIAGNOSIS — R42 Dizziness and giddiness: Secondary | ICD-10-CM | POA: Diagnosis not present

## 2014-10-05 LAB — POCT CBC
Granulocyte percent: 76.1 %G (ref 37–80)
HCT, POC: 38.2 % — AB (ref 43.5–53.7)
HEMOGLOBIN: 11.9 g/dL — AB (ref 14.1–18.1)
Lymph, poc: 1.6 (ref 0.6–3.4)
MCH: 30.3 pg (ref 27–31.2)
MCHC: 31.1 g/dL — AB (ref 31.8–35.4)
MCV: 97.5 fL — AB (ref 80–97)
MPV: 8 fL (ref 0–99.8)
POC GRANULOCYTE: 7.2 — AB (ref 2–6.9)
POC LYMPH %: 16.9 % (ref 10–50)
Platelet Count, POC: 297 10*3/uL (ref 142–424)
RBC: 3.91 M/uL — AB (ref 4.69–6.13)
RDW, POC: 12.7 %
WBC: 9.4 10*3/uL (ref 4.6–10.2)

## 2014-10-05 MED ORDER — FUROSEMIDE 40 MG PO TABS: 20.0000 mg | ORAL_TABLET | Freq: Every day | ORAL | Status: DC

## 2014-10-05 NOTE — Patient Instructions (Signed)
It is critical for you kidney health that you drink 64 oz of water daily. THis is in addition to the other fluids you drink. You can add a little lemon or something similar, but you can not count soda, coffee, tea or other flavored drinks.

## 2014-10-05 NOTE — Progress Notes (Signed)
Subjective:    Patient ID: Michael Cervantes, male    DOB: 31-Jan-1946, 69 y.o.   MRN: [id number]  HPI Patient here today for low Blood pressure and dizziness. He has recently taken himself off of his Lisinopril 10 mg, to see if this would get better and it has, but only slightly. He does bring in several Home BP readings to visit today. The dizziness is described as a lightheadedness he has not passed out but has felt somewhat like he could easily pass noted to. He seems to be worsening it's been ongoing since starting on the lisinopril. It has improved somewhat. He is concerned       Patient Active Problem List   Diagnosis Date Noted  . 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 Cough   . Cellulitis 09/12/2014  . Severe sepsis 09/12/2014  . Cellulitis of right foot   . Other specified hypotension    Outpatient Encounter Prescriptions as of 10/05/2014  Medication Sig  . allopurinol (ZYLOPRIM) 100 MG tablet Take 1 tablet (100 mg total) by mouth daily.  . colchicine 0.6 MG tablet Take 1 tablet (0.6 mg total) by mouth daily.  . furosemide (LASIX) 40 MG tablet Take 40 mg by mouth daily.  Marland Kitchen lovastatin (MEVACOR) 40 MG tablet Take 40 mg by mouth at bedtime.  Marland Kitchen lisinopril (PRINIVIL,ZESTRIL) 10 MG tablet Take 10 mg by mouth daily.     Review of Systems  Constitutional: Negative.  Negative for fever, chills, diaphoresis and unexpected weight change.  HENT: Negative.  Negative for congestion, hearing loss, rhinorrhea, sore throat and trouble swallowing.   Eyes: Negative.   Respiratory: Negative.  Negative for cough, chest tightness, shortness of breath and wheezing.   Cardiovascular: Negative.        Low BP and elevated HR  Gastrointestinal: Negative.  Negative for nausea, vomiting, abdominal pain, diarrhea, constipation and abdominal distention.  Endocrine: Negative.  Negative for cold intolerance and heat intolerance.  Genitourinary: Negative.  Negative for dysuria,  hematuria and flank pain.  Musculoskeletal: Negative.  Negative for joint swelling and arthralgias.  Skin: Negative.  Negative for rash.  Allergic/Immunologic: Negative.   Neurological: Positive for dizziness. Negative for headaches.  Hematological: Negative.   Psychiatric/Behavioral: Negative.  Negative for dysphoric mood, decreased concentration and agitation. The patient is not nervous/anxious.        Objective:   Physical Exam  Constitutional: He is oriented to person, place, and time. He appears well-developed and well-nourished. No distress.  HENT:  Head: Normocephalic and atraumatic.  Right Ear: External ear normal.  Left Ear: External ear normal.  Nose: Nose normal.  Mouth/Throat: Oropharynx is clear and moist.  Eyes: Conjunctivae and EOM are normal. Pupils are equal, round, and reactive to light.  Neck: Normal range of motion. Neck supple. No thyromegaly present.  Cardiovascular: Normal rate, regular rhythm and normal heart sounds.   No murmur heard. Pulmonary/Chest: Effort normal and breath sounds normal. No respiratory distress. He has no wheezes. He has no rales.  Abdominal: Soft. Bowel sounds are normal. He exhibits no distension. There is no tenderness.  Lymphadenopathy:    He has no cervical adenopathy.  Neurological: He is alert and oriented to person, place, and time. He has normal reflexes.  Skin: Skin is warm and dry.  Psychiatric: He has a normal mood and affect. His behavior is normal. Judgment and thought content normal.   BP 120/80 mmHg  Pulse 101  Temp(Src) 97.9 F (  36.6 C) (Oral)  Ht 5' 11" (1.803 m)  Wt 178 lb (80.74 kg)  BMI 24.84 kg/m2        Assessment & Plan:   1. Hypotension, unspecified hypotension type   2. Dizziness and giddiness     Meds ordered this encounter  Medications  . furosemide (LASIX) 40 MG tablet    Sig: Take 0.5 tablets (20 mg total) by mouth daily.    Dispense:  30 tablet    Refill:  5    Orders Placed This  Encounter  Procedures  . CMP14+EGFR  . POCT CBC    Labs pending Health Maintenance reviewed Diet and exercise encouraged Continue all meds as discussed Follow up in 2 weeks  Claretta Fraise, MD

## 2014-10-05 NOTE — Telephone Encounter (Signed)
Appointment scheduled.

## 2014-10-06 ENCOUNTER — Encounter: Payer: Self-pay | Admitting: Family Medicine

## 2014-10-06 LAB — CMP14+EGFR
A/G RATIO: 1.7 (ref 1.1–2.5)
ALBUMIN: 4.3 g/dL (ref 3.6–4.8)
ALT: 19 IU/L (ref 0–44)
AST: 21 IU/L (ref 0–40)
Alkaline Phosphatase: 93 IU/L (ref 39–117)
BUN/Creatinine Ratio: 15 (ref 10–22)
BUN: 31 mg/dL — AB (ref 8–27)
Bilirubin Total: 0.4 mg/dL (ref 0.0–1.2)
CO2: 23 mmol/L (ref 18–29)
CREATININE: 2.01 mg/dL — AB (ref 0.76–1.27)
Calcium: 10.3 mg/dL — ABNORMAL HIGH (ref 8.6–10.2)
Chloride: 99 mmol/L (ref 97–108)
GFR, EST AFRICAN AMERICAN: 38 mL/min/{1.73_m2} — AB (ref >59)
GFR, EST NON AFRICAN AMERICAN: 33 mL/min/{1.73_m2} — AB (ref >59)
Globulin, Total: 2.5 g/dL (ref 1.5–4.5)
Glucose: 91 mg/dL (ref 65–99)
Potassium: 4 mmol/L (ref 3.5–5.2)
SODIUM: 139 mmol/L (ref 134–144)
Total Protein: 6.8 g/dL (ref 6.0–8.5)

## 2014-10-09 ENCOUNTER — Other Ambulatory Visit: Payer: Self-pay | Admitting: Family Medicine

## 2014-10-11 ENCOUNTER — Encounter: Payer: Self-pay | Admitting: Family Medicine

## 2014-10-11 ENCOUNTER — Ambulatory Visit (INDEPENDENT_AMBULATORY_CARE_PROVIDER_SITE_OTHER): Payer: Medicare Other | Admitting: Family Medicine

## 2014-10-11 VITALS — BP 109/73 | HR 100 | Temp 98.6°F | Ht 71.0 in | Wt 177.8 lb

## 2014-10-11 DIAGNOSIS — R42 Dizziness and giddiness: Secondary | ICD-10-CM

## 2014-10-11 DIAGNOSIS — I959 Hypotension, unspecified: Secondary | ICD-10-CM

## 2014-10-11 DIAGNOSIS — R000 Tachycardia, unspecified: Secondary | ICD-10-CM | POA: Diagnosis not present

## 2014-10-11 LAB — POCT UA - MICROSCOPIC ONLY
BACTERIA, U MICROSCOPIC: NEGATIVE
CASTS, UR, LPF, POC: NEGATIVE
Crystals, Ur, HPF, POC: NEGATIVE
Epithelial cells, urine per micros: NEGATIVE
Mucus, UA: NEGATIVE
RBC, urine, microscopic: NEGATIVE
YEAST UA: NEGATIVE

## 2014-10-11 LAB — POCT URINALYSIS DIPSTICK
BILIRUBIN UA: NEGATIVE
Blood, UA: NEGATIVE
Glucose, UA: NEGATIVE
Ketones, UA: NEGATIVE
Leukocytes, UA: NEGATIVE
NITRITE UA: NEGATIVE
PH UA: 6
PROTEIN UA: NEGATIVE
Spec Grav, UA: 1.01
Urobilinogen, UA: NEGATIVE

## 2014-10-11 NOTE — Progress Notes (Signed)
Subjective:  Patient ID: Michael Cervantes, male    DOB: 1946/06/21  Age: 69 y.o. MRN: [id number]  CC: Hypotension and Tachycardia   HPI Michael Cervantes presents for follow-up on dizziness and low blood pressure and high pulse. He brings in readings showing his pulse to be in the 110 to 130 range ever since he was here last week. He says he occasionally gets dizzy but not nearly so bad as before. He started swelling a couple of days after he was here and subsequently had to go back on the full pill of Lasix every day. The dizziness did not seem to get worse once he went back on the whole pill. The dizziness is not affecting his life in that he is able go about normal activities. It has been intermittent since last visit here last week. Prior to that it had been more frequent so it is apparently getting somewhat better. He is trying to drink more water to control it as well. He is going for 64 ounces daily.  History Michael Cervantes has a past medical history of Polycystic kidney disease; Hypertension; Arthritis; OA (osteoarthritis) of foot; Anemia; and Cellulitis (08/2014).   He has past surgical history that includes Cyst excision and Tonsillectomy.   His family history is not on file.He reports that he quit smoking about 25 years ago. His smokeless tobacco use includes Snuff. He reports that he drinks alcohol. He reports that he does not use illicit drugs.  Current Outpatient Prescriptions on File Prior to Visit  Medication Sig Dispense Refill  . allopurinol (ZYLOPRIM) 100 MG tablet Take 1 tablet (100 mg total) by mouth daily. 60 tablet 0  . colchicine 0.6 MG tablet Take 1 tablet (0.6 mg total) by mouth daily. 30 tablet 0  . furosemide (LASIX) 40 MG tablet Take 0.5 tablets (20 mg total) by mouth daily. 30 tablet 5  . lovastatin (MEVACOR) 40 MG tablet Take 40 mg by mouth at bedtime.     No current facility-administered medications on file prior to visit.    ROS Review of Systems  Constitutional:  Negative for fever, chills, diaphoresis and unexpected weight change.  HENT: Negative for congestion, hearing loss, rhinorrhea, sore throat and trouble swallowing.   Respiratory: Negative for cough, chest tightness, shortness of breath and wheezing.   Gastrointestinal: Negative for nausea, vomiting, abdominal pain, diarrhea, constipation and abdominal distention.  Endocrine: Negative for cold intolerance and heat intolerance.  Genitourinary: Negative for dysuria, hematuria and flank pain.  Musculoskeletal: Negative for joint swelling and arthralgias.  Skin: Negative for rash.  Neurological: Positive for dizziness and headaches.  Psychiatric/Behavioral: Negative for dysphoric mood, decreased concentration and agitation. The patient is not nervous/anxious.     Objective:  BP 109/73 mmHg  Pulse 100  Temp(Src) 98.6 F (37 C) (Oral)  Ht 5\' 11"  (1.803 m)  Wt 177 lb 12.8 oz (80.65 kg)  BMI 24.81 kg/m2  BP Readings from Last 3 Encounters:  10/11/14 109/73  10/05/14 120/80  09/21/14 97/72    Wt Readings from Last 3 Encounters:  10/11/14 177 lb 12.8 oz (80.65 kg)  10/05/14 178 lb (80.74 kg)  09/21/14 179 lb (81.194 kg)     Physical Exam  Constitutional: He is oriented to person, place, and time. He appears well-developed and well-nourished. No distress.  HENT:  Head: Normocephalic and atraumatic.  Right Ear: External ear normal.  Left Ear: External ear normal.  Nose: Nose normal.  Mouth/Throat: Oropharynx is clear and moist.  Eyes: Conjunctivae  and EOM are normal. Pupils are equal, round, and reactive to light.  Neck: Normal range of motion. Neck supple. No thyromegaly present.  Cardiovascular: Normal rate, regular rhythm and normal heart sounds.   No murmur heard. Pulmonary/Chest: Effort normal and breath sounds normal. No respiratory distress. He has no wheezes. He has no rales.  Abdominal: Soft. Bowel sounds are normal. He exhibits no distension. There is no tenderness.    Lymphadenopathy:    He has no cervical adenopathy.  Neurological: He is alert and oriented to person, place, and time. He has normal reflexes.  Skin: Skin is warm and dry.  Psychiatric: He has a normal mood and affect. His behavior is normal. Judgment and thought content normal.    No results found for: HGBA1C  Lab Results  Component Value Date   WBC 9.4 10/05/2014   HGB 11.9* 10/05/2014   HCT 38.2* 10/05/2014   PLT 231 09/15/2014   GLUCOSE 91 10/05/2014   ALT 19 10/05/2014   AST 21 10/05/2014   NA 139 10/05/2014   K 4.0 10/05/2014   CL 99 10/05/2014   CREATININE 2.01* 10/05/2014   BUN 31* 10/05/2014   CO2 23 10/05/2014   TSH 2.050 10/11/2014    Dg Chest 2 View  09/12/2014   CLINICAL DATA:  69 year old male with cough and congestion for 8 days. Initial encounter. Personal history former smoker.  EXAM: CHEST  2 VIEW  COMPARISON:  None.  FINDINGS: Upper limits of normal lung volumes. Normal cardiac size and mediastinal contours. Visualized tracheal air column is within normal limits. No pneumothorax, pulmonary edema, pleural effusion or confluent pulmonary opacity. No acute osseous abnormality identified.  IMPRESSION: No acute cardiopulmonary abnormality.   Electronically Signed   By: Genevie Ann M.D.   On: 09/12/2014 14:46   Dg Ankle Right Port  09/12/2014   CLINICAL DATA:  Right foot cellulitis initial evaluation, concern for septic arthritis right ankle joint  EXAM: PORTABLE RIGHT ANKLE - 2 VIEW  COMPARISON:  None.  FINDINGS: No fracture or dislocation. Mild tibiotalar arthritis with small anterior osteophyte. Increased attenuation surrounding the joint suggests possibility of small to moderate joint effusion. No evidence of osteomyelitis. No evidence of erosive change involving the osseous components of the ankle joint.  IMPRESSION: Probable ankle joint effusion.  No acute findings otherwise.   Electronically Signed   By: Skipper Cliche M.D.   On: 09/12/2014 19:13   Dg Foot Complete  Right  09/12/2014   CLINICAL DATA:  Initial evaluation for right foot cellulitis, concern for septic arthritis of the ankle joint  EXAM: RIGHT FOOT COMPLETE - 3+ VIEW  COMPARISON:  None.  FINDINGS: There appears to be a moderate ankle joint effusion. There is mild tibiotalar arthritis. No evidence of fracture or dislocation involving the bones of the right foot. No evidence of osteomyelitis or other acute findings.  IMPRESSION: Ankle joint effusion.  No acute findings otherwise.   Electronically Signed   By: Skipper Cliche M.D.   On: 09/12/2014 19:14    Assessment & Plan:   Michael Cervantes was seen today for hypotension and tachycardia.  Diagnoses and all orders for this visit:  Hypotension, unspecified hypotension type Orders: -     EKG 12-Lead -     Thyroid Panel With TSH -     CBC with Differential/Platelet -     POCT urinalysis dipstick -     POCT UA - Microscopic Only -     Sedimentation rate  Dizziness and giddiness  Orders: -     EKG 12-Lead -     Thyroid Panel With TSH -     CBC with Differential/Platelet -     POCT urinalysis dipstick -     POCT UA - Microscopic Only -     Metanephrines, Pheochromocyt -     Sedimentation rate  Tachycardia Orders: -     EKG 12-Lead -     Thyroid Panel With TSH -     CBC with Differential/Platelet -     POCT urinalysis dipstick -     POCT UA - Microscopic Only -     Metanephrines, Pheochromocyt -     Sedimentation rate   I am having Michael Cervantes maintain his lovastatin, colchicine, allopurinol, furosemide, and lisinopril.  Meds ordered this encounter  Medications  . lisinopril (PRINIVIL,ZESTRIL) 10 MG tablet    Sig: Take 10 mg by mouth daily.    Refill:  1     Follow-up: No Follow-up on file.  Claretta Fraise, M.D.

## 2014-10-12 ENCOUNTER — Other Ambulatory Visit: Payer: Medicare Other

## 2014-10-12 LAB — THYROID PANEL WITH TSH
FREE THYROXINE INDEX: 2.7 (ref 1.2–4.9)
T3 Uptake Ratio: 31 % (ref 24–39)
T4, Total: 8.8 ug/dL (ref 4.5–12.0)
TSH: 2.05 u[IU]/mL (ref 0.450–4.500)

## 2014-10-12 LAB — SEDIMENTATION RATE: Sed Rate: 44 mm/hr — ABNORMAL HIGH (ref 0–30)

## 2014-10-12 NOTE — Progress Notes (Signed)
Lab order from 10-11-2014

## 2014-10-14 LAB — METANEPHRINES, PHEOCHROMOCYT
Creatinine, Random U: 52.8 mg/dL (ref 22.0–328.0)
Metaneph Total, Ur: 74 ug/L
Metaneph/Creat Ratio: 0.6 ug/mg{creat} (ref 0.0–1.0)
Normetanephrine, Ur: 201 ug/L

## 2014-10-17 LAB — ANA: Anti Nuclear Antibody(ANA): NEGATIVE

## 2014-10-17 LAB — SPECIMEN STATUS REPORT

## 2014-10-17 LAB — RHEUMATOID FACTOR: Rhuematoid fact SerPl-aCnc: 10.4 IU/mL (ref 0.0–13.9)

## 2014-10-19 ENCOUNTER — Encounter: Payer: Self-pay | Admitting: Family Medicine

## 2014-10-19 ENCOUNTER — Ambulatory Visit (INDEPENDENT_AMBULATORY_CARE_PROVIDER_SITE_OTHER): Payer: Medicare Other | Admitting: Family Medicine

## 2014-10-19 VITALS — BP 110/67 | HR 84 | Temp 97.8°F | Ht 71.0 in | Wt 183.6 lb

## 2014-10-19 DIAGNOSIS — E785 Hyperlipidemia, unspecified: Secondary | ICD-10-CM

## 2014-10-19 DIAGNOSIS — R42 Dizziness and giddiness: Secondary | ICD-10-CM | POA: Diagnosis not present

## 2014-10-19 DIAGNOSIS — N183 Chronic kidney disease, stage 3 unspecified: Secondary | ICD-10-CM

## 2014-10-19 DIAGNOSIS — Z23 Encounter for immunization: Secondary | ICD-10-CM | POA: Diagnosis not present

## 2014-10-19 NOTE — Patient Instructions (Signed)
Continue the allopurinol every day for gout prevention. You should cut the colchicine back to just as needed for gout pain. If you're not having pain do not take the colchicine.  If you notice your blood pressure going over 140/90 on any significant number of occasions you should resume the lisinopril.

## 2014-10-19 NOTE — Progress Notes (Signed)
Subjective:  Patient ID: Michael Cervantes, male    DOB: July 13, 1946  Age: 69 y.o. MRN: [id number]  CC: Hypotension   HPI  Michael Cervantes presents for patient stating that he'll have a spell lasting just a few seconds perhaps once or twice a day at most. Some days not at all. If he gets up suddenly he'll feel lightheaded and faint and feel like his blood pressures dropping but it's gone in and just a few seconds and then he's normal. At bedtime last night his blood pressure was 137/88 this is the highest it's been in several months. In order to relieve his symptoms and to stabilize his chronic renal insufficiency does continue to drink large amounts of water every day to flush his kidneys as well as taken furosemide tablet once daily as noted in his med list. In 10 use to take his gout prevention pill, allopurinol.  Patient in for follow-up of elevated cholesterol. Doing well without complaints on current medication. Denies side effects of statin including myalgia and arthralgia and nausea. Also in today for liver function testing. Currently no chest pain, shortness of breath or other cardiovascular related symptoms noted.  History Michael Cervantes has a past medical history of Polycystic kidney disease; Hypertension; Arthritis; OA (osteoarthritis) of foot; Anemia; and Cellulitis (08/2014).   He has past surgical history that includes Cyst excision and Tonsillectomy.   His family history is not on file.He reports that he quit smoking about 25 years ago. His smokeless tobacco use includes Snuff. He reports that he drinks alcohol. He reports that he does not use illicit drugs.  Current Outpatient Prescriptions on File Prior to Visit  Medication Sig Dispense Refill  . allopurinol (ZYLOPRIM) 100 MG tablet Take 1 tablet (100 mg total) by mouth daily. 60 tablet 0  . colchicine 0.6 MG tablet Take 1 tablet (0.6 mg total) by mouth daily. 30 tablet 0  . furosemide (LASIX) 40 MG tablet Take 0.5 tablets (20 mg total) by  mouth daily. 30 tablet 5  . lisinopril (PRINIVIL,ZESTRIL) 10 MG tablet Take 10 mg by mouth daily.  1  . lovastatin (MEVACOR) 40 MG tablet Take 40 mg by mouth at bedtime.     No current facility-administered medications on file prior to visit.    ROS Review of Systems  Constitutional: Negative for fever, chills, diaphoresis and unexpected weight change.  HENT: Negative for congestion, hearing loss, rhinorrhea, sore throat and trouble swallowing.   Respiratory: Negative for cough, chest tightness, shortness of breath and wheezing.   Gastrointestinal: Negative for nausea, vomiting, abdominal pain, diarrhea, constipation and abdominal distention.  Endocrine: Negative for cold intolerance and heat intolerance.  Genitourinary: Negative for dysuria, hematuria and flank pain.  Musculoskeletal: Negative for joint swelling and arthralgias.  Skin: Negative for rash.  Neurological: Negative for dizziness and headaches.  Psychiatric/Behavioral: Negative for dysphoric mood, decreased concentration and agitation. The patient is not nervous/anxious.     Objective:  BP 110/67 mmHg  Pulse 84  Temp(Src) 97.8 F (36.6 C) (Oral)  Ht 5' 11" (1.803 m)  Wt 183 lb 9.6 oz (83.28 kg)  BMI 25.62 kg/m2  Physical Exam  Constitutional: He is oriented to person, place, and time. He appears well-developed and well-nourished. No distress.  HENT:  Head: Normocephalic and atraumatic.  Right Ear: External ear normal.  Left Ear: External ear normal.  Nose: Nose normal.  Mouth/Throat: Oropharynx is clear and moist.  Eyes: Conjunctivae and EOM are normal. Pupils are equal, round, and reactive  to light.  Neck: Normal range of motion. Neck supple. No thyromegaly present.  Cardiovascular: Normal rate, regular rhythm and normal heart sounds.   No murmur heard. Pulmonary/Chest: Effort normal and breath sounds normal. No respiratory distress. He has no wheezes. He has no rales.  Abdominal: Soft. Bowel sounds are  normal. He exhibits no distension. There is no tenderness.  Lymphadenopathy:    He has no cervical adenopathy.  Neurological: He is alert and oriented to person, place, and time. He has normal reflexes.  Skin: Skin is warm and dry.  Psychiatric: He has a normal mood and affect. His behavior is normal. Judgment and thought content normal.    Assessment & Plan:   Michael Cervantes was seen today for hypotension.  Diagnoses and all orders for this visit:  Chronic renal insufficiency, stage 3 (moderate) Orders: -     CMP14+EGFR  Hyperlipidemia Orders: -     CMP14+EGFR  Dizziness and giddiness Orders: -     CMP14+EGFR  Other orders -     Pneumococcal conjugate vaccine 13-valent  I am having Michael Cervantes maintain his lovastatin, colchicine, allopurinol, furosemide, and lisinopril.  No orders of the defined types were placed in this encounter.     Follow-up: No Follow-up on file.  Claretta Fraise, M.D.

## 2014-10-20 LAB — CMP14+EGFR
A/G RATIO: 1.5 (ref 1.1–2.5)
ALBUMIN: 3.9 g/dL (ref 3.6–4.8)
ALT: 17 IU/L (ref 0–44)
AST: 20 IU/L (ref 0–40)
Alkaline Phosphatase: 86 IU/L (ref 39–117)
BUN/Creatinine Ratio: 17 (ref 10–22)
BUN: 27 mg/dL (ref 8–27)
Bilirubin Total: <0.2 mg/dL (ref 0.0–1.2)
CALCIUM: 9.6 mg/dL (ref 8.6–10.2)
CO2: 24 mmol/L (ref 18–29)
CREATININE: 1.61 mg/dL — AB (ref 0.76–1.27)
Chloride: 104 mmol/L (ref 97–108)
GFR calc Af Amer: 50 mL/min/{1.73_m2} — ABNORMAL LOW (ref >59)
GFR calc non Af Amer: 43 mL/min/{1.73_m2} — ABNORMAL LOW (ref >59)
Globulin, Total: 2.6 g/dL (ref 1.5–4.5)
Glucose: 96 mg/dL (ref 65–99)
Potassium: 4 mmol/L (ref 3.5–5.2)
SODIUM: 145 mmol/L — AB (ref 134–144)
TOTAL PROTEIN: 6.5 g/dL (ref 6.0–8.5)

## 2014-11-13 ENCOUNTER — Other Ambulatory Visit: Payer: Self-pay | Admitting: Family Medicine

## 2014-11-15 ENCOUNTER — Other Ambulatory Visit: Payer: Self-pay

## 2014-11-15 MED ORDER — ALLOPURINOL 100 MG PO TABS: 100.0000 mg | ORAL_TABLET | Freq: Every day | ORAL | Status: DC

## 2014-11-22 ENCOUNTER — Encounter: Payer: Self-pay | Admitting: *Deleted

## 2014-11-22 ENCOUNTER — Ambulatory Visit (INDEPENDENT_AMBULATORY_CARE_PROVIDER_SITE_OTHER): Payer: Medicare Other | Admitting: *Deleted

## 2014-11-22 VITALS — BP 124/74 | HR 58 | Ht 70.0 in | Wt 182.0 lb

## 2014-11-22 DIAGNOSIS — Z0000 Encounter for general adult medical examination without abnormal findings: Secondary | ICD-10-CM | POA: Diagnosis not present

## 2014-11-22 DIAGNOSIS — M1A9XX0 Chronic gout, unspecified, without tophus (tophi): Secondary | ICD-10-CM | POA: Insufficient documentation

## 2014-11-22 DIAGNOSIS — Q613 Polycystic kidney, unspecified: Secondary | ICD-10-CM | POA: Insufficient documentation

## 2014-11-22 NOTE — Progress Notes (Signed)
Subjective:   Michael Cervantes is a 69 y.o. male who presents for an Initial Medicare Annual Wellness Visit. 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 He is married with 2 children and lives at home with his wife. He was walking several miles a day and working around the house and yard until he had a severe gout flare up 2 months ago. He is improving and slowly increasing his activity level as tolerated.   Review of Systems   Cardiac Risk Factors include: advanced age (>66men, >25 women);dyslipidemia;hypertension;male gender;smoking/ tobacco exposure  Musculoskeletal: Continued right knee pain s/p gout flare 2 months ago. He is taking allopurinol and colcrys.  Other systems negative today.    Objective:    Today's Vitals   11/22/14 1049  BP: 124/74  Pulse: 58  Height: 5\' 10"  (1.778 m)  Weight: 182 lb (82.555 kg)  BMI      26.11  Current Medications (verified) Outpatient Encounter Prescriptions as of 11/22/2014  Medication Sig  . allopurinol (ZYLOPRIM) 100 MG tablet Take 1 tablet (100 mg total) by mouth daily.  . colchicine 0.6 MG tablet Take 1 tablet (0.6 mg total) by mouth daily.  . furosemide (LASIX) 40 MG tablet Take 0.5 tablets (20 mg total) by mouth daily. (Patient taking differently: Take 40 mg by mouth daily. )  . lisinopril (PRINIVIL,ZESTRIL) 10 MG tablet Take 10 mg by mouth daily.  Marland Kitchen lovastatin (MEVACOR) 40 MG tablet Take 40 mg by mouth at bedtime.  . Multiple Vitamins-Minerals (ONE-A-DAY MENS HEALTH FORMULA PO) Take 1 tablet by mouth daily.    Allergies (verified) Cefaclor - Rash  History: Past Medical History  Diagnosis Date  . Hypertension   . Arthritis   . OA (osteoarthritis) of foot     rt foot  . Anemia   . Cellulitis 08/2014    rt foot  . Polycystic kidney disease        Gout-Right knee              08/2014    Past Surgical History  Procedure Laterality Date  . Cyst excision    . Tonsillectomy     Family History  Problem  Relation Age of Onset  . Dementia Mother   . Cancer Father     lymphoma  . Arthritis Sister    Social History   Occupational History  . Not on file.   Social History Main Topics  . Smoking status: Former Smoker    Quit date: 07/28/1989  . Smokeless tobacco: Current User    Types: Snuff  . Alcohol Use: No  . Drug Use: No  . Sexual Activity: Not on file   Tobacco Counseling Ready to Quite: No Counseling Given: No  Activities of Daily Living In your present state of health, do you have any difficulty performing the following activities: 11/22/2014 09/12/2014  Hearing? Tempie Donning  Vision? N N  Difficulty concentrating or making decisions? Y N  Walking or climbing stairs? N Y  Dressing or bathing? N N  Doing errands, shopping? N N  Preparing Food and eating ? N -  Using the Toilet? N -  In the past six months, have you accidently leaked urine? N -  Do you have problems with loss of bowel control? N -  Managing your Medications? N -  Managing your Finances? N -  Housekeeping or managing your Housekeeping? N -  Error in charting above-Patient doesn't have any difficulty  concentrating or making decisions.  He does have some hearing loss which was evaluated by Dr Romeo Rabon last month and found to not be significant enough to impair his driving.   Immunizations and Health Maintenance Immunization History  Administered Date(s) Administered  . Influenza-Unspecified 06/12/2014  . Pneumococcal Conjugate-13 10/19/2014  . Pneumococcal Polysaccharide-23 07/28/2005  . Tdap 12/13/2004   There are no preventive care reminders to display for this patient.  Patient Care Team: Claretta Fraise, MD as PCP - General (Family Medicine) Precious Gilding, MD as Referring Physician (Nephrology) Williams Che, MD as Consulting Physician (Ophthalmology) Raeanne Gathers, Audiology  Patient sees Dr Joseph [facility]kshire at [facility] every 6 months for f/u on polycystic kidney disease.       Assessment:   This is a routine wellness examination for Md.   Hearing/Vision screen No hearing or vision deficits noted during visit  Dietary issues and exercise activities discussed: Current Exercise Habits:: Home exercise routine (Patient works around the house and yard. He was walking regularly but had to stop due to gout. He is slowly increasing his activity level again. ), Type of exercise: walking, Intensity: Mild  Goals    . Exercise 3x per week (30 min per time)      Depression Screen PHQ 2/9 Scores 11/22/2014 10/05/2014 09/21/2014 08/24/2014  PHQ - 2 Score 0 0 0 0    Fall Risk Fall Risk  11/22/2014 10/05/2014 09/21/2014 08/24/2014  Falls in the past year? No No No No    Cognitive Function: MMSE - Mini Mental State Exam 11/22/2014  Orientation to time 5  Orientation to Place 5  Registration 3  Attention/ Calculation 5  Recall 3  Language- name 2 objects 2  Language- repeat 1  Language- follow 3 step command 3  Language- read & follow direction 1  Write a sentence 1  Copy design 1  Total score 30  No deficit  Screening Tests Health Maintenance  Topic Date Due  . ZOSTAVAX  01/19/2015 (Originally 07/16/2005)  . INFLUENZA VACCINE  02/26/2015  . PNA vac Low Risk Adult (2 of 2 - PPSV23) 10/19/2015  . COLONOSCOPY  10/10/2020  . TETANUS/TDAP  10/10/2021        Plan:   Keep appointment with Dr Livia Snellen on 12/01/14 Review advance directives with family. Provide a copy to our office.  During the course of the visit [name] was educated and counseled about the following appropriate screening and preventive services:   Vaccines to include Pneumoccal-completed, Influenza-suggested yearly, Tdap-up to date, Zostavax-will check price at next visit  Electrocardiogram-done on 10/11/14  Colorectal cancer screening-colonoscopy 2012   Cardiovascular disease-Lipids screened at routine office visit  Diabetes screening-Glucose screened at routine office visit  Glaucoma  screening-Schedule eye exam and have them forward report to our office  Nutrition counseling-Balanced diet with lean protein, fruits, and vegetables  Prostate cancer screening-Due for PSA and prostate exam   Exercise-Increase exercise as tolerated. Aim for at least 30 minutes 3 times a week.    Patient Instructions (the written plan) were given to the patient.   Chong Sicilian, RN  11/22/2014       I have reviewed and agree with the above AWV documentation.  Claretta Fraise, M.D.

## 2014-11-22 NOTE — Patient Instructions (Signed)
Schedule an eye exam. Continue to increase activity level as tolerated. Aim for 30 minutes 3 times a week.  Review advanced directives with family.    Health Maintenance A healthy lifestyle and preventative care can promote health and wellness.  Maintain regular health, dental, and eye exams.  Eat a healthy diet. Foods like vegetables, fruits, whole grains, low-fat dairy products, and lean protein foods contain the nutrients you need and are low in calories. Decrease your intake of foods high in solid fats, added sugars, and salt. Get information about a proper diet from your health care provider, if necessary.  Regular physical exercise is one of the most important things you can do for your health. Most adults should get at least 150 minutes of moderate-intensity exercise (any activity that increases your heart rate and causes you to sweat) each week. In addition, most adults need muscle-strengthening exercises on 2 or more days a week.   Maintain a healthy weight. The body mass index (BMI) is a screening tool to identify possible weight problems. It provides an estimate of body fat based on height and weight. Your health care provider can find your BMI and can help you achieve or maintain a healthy weight. For males 20 years and older:  A BMI below 18.5 is considered underweight.  A BMI of 18.5 to 24.9 is normal.  A BMI of 25 to 29.9 is considered overweight.  A BMI of 30 and above is considered obese.  Maintain normal blood lipids and cholesterol by exercising and minimizing your intake of saturated fat. Eat a balanced diet with plenty of fruits and vegetables. Blood tests for lipids and cholesterol should begin at age 91 and be repeated every 5 years. If your lipid or cholesterol levels are high, you are over age 60, or you are at high risk for heart disease, you may need your cholesterol levels checked more frequently.Ongoing high lipid and cholesterol levels should be treated with  medicines if diet and exercise are not working.  If you smoke, find out from your health care provider how to quit. If you do not use tobacco, do not start.  Lung cancer screening is recommended for adults aged 42-80 years who are at high risk for developing lung cancer because of a history of smoking. A yearly low-dose CT scan of the lungs is recommended for people who have at least a 30-pack-year history of smoking and are current smokers or have quit within the past 15 years. A pack year of smoking is smoking an average of 1 pack of cigarettes a day for 1 year (for example, a 30-pack-year history of smoking could mean smoking 1 pack a day for 30 years or 2 packs a day for 15 years). Yearly screening should continue until the smoker has stopped smoking for at least 15 years. Yearly screening should be stopped for people who develop a health problem that would prevent them from having lung cancer treatment.  If you choose to drink alcohol, do not have more than 2 drinks per day. One drink is considered to be 12 oz (360 mL) of beer, 5 oz (150 mL) of wine, or 1.5 oz (45 mL) of liquor.  Avoid the use of street drugs. Do not share needles with anyone. Ask for help if you need support or instructions about stopping the use of drugs.  High blood pressure causes heart disease and increases the risk of stroke. Blood pressure should be checked at least every 1-2 years. Ongoing high  blood pressure should be treated with medicines if weight loss and exercise are not effective.  If you are 51-72 years old, ask your health care provider if you should take aspirin to prevent heart disease.  Diabetes screening involves taking a blood sample to check your fasting blood sugar level. This should be done once every 3 years after age 26 if you are at a normal weight and without risk factors for diabetes. Testing should be considered at a younger age or be carried out more frequently if you are overweight and have at  least 1 risk factor for diabetes.  Colorectal cancer can be detected and often prevented. Most routine colorectal cancer screening begins at the age of 24 and continues through age 55. However, your health care provider may recommend screening at an earlier age if you have risk factors for colon cancer. On a yearly basis, your health care provider may provide home test kits to check for hidden blood in the stool. A small camera at the end of a tube may be used to directly examine the colon (sigmoidoscopy or colonoscopy) to detect the earliest forms of colorectal cancer. Talk to your health care provider about this at age 37 when routine screening begins. A direct exam of the colon should be repeated every 5-10 years through age 90, unless early forms of precancerous polyps or small growths are found.  People who are at an increased risk for hepatitis B should be screened for this virus. You are considered at high risk for hepatitis B if:  You were born in a country where hepatitis B occurs often. Talk with your health care provider about which countries are considered high risk.  Your parents were born in a high-risk country and you have not received a shot to protect against hepatitis B (hepatitis B vaccine).  You have HIV or AIDS.  You use needles to inject street drugs.  You live with, or have sex with, someone who has hepatitis B.  You are a man who has sex with other men (MSM).  You get hemodialysis treatment.  You take certain medicines for conditions like cancer, organ transplantation, and autoimmune conditions.  Hepatitis C blood testing is recommended for all people born from [birth year] through 1966 and any individual with known risk factors for hepatitis C.  Healthy men should no longer receive prostate-specific antigen (PSA) blood tests as part of routine cancer screening. Talk to your health care provider about prostate cancer screening.  Testicular cancer screening is not recommended  for adolescents or adult males who have no symptoms. Screening includes self-exam, a health care provider exam, and other screening tests. Consult with your health care provider about any symptoms you have or any concerns you have about testicular cancer.  Practice safe sex. Use condoms and avoid high-risk sexual practices to reduce the spread of sexually transmitted infections (STIs).  You should be screened for STIs, including gonorrhea and chlamydia if:  You are sexually active and are younger than 24 years.  You are older than 24 years, and your health care provider tells you that you are at risk for this type of infection.  Your sexual activity has changed since you were last screened, and you are at an increased risk for chlamydia or gonorrhea. Ask your health care provider if you are at risk.  If you are at risk of being infected with HIV, it is recommended that you take a prescription medicine daily to prevent HIV infection. This is called  pre-exposure prophylaxis (PrEP). You are considered at risk if:  You are a man who has sex with other men (MSM).  You are a heterosexual man who is sexually active with multiple partners.  You take drugs by injection.  You are sexually active with a partner who has HIV.  Talk with your health care provider about whether you are at high risk of being infected with HIV. If you choose to begin PrEP, you should first be tested for HIV. You should then be tested every 3 months for as long as you are taking PrEP.  Use sunscreen. Apply sunscreen liberally and repeatedly throughout the day. You should seek shade when your shadow is shorter than you. Protect yourself by wearing long sleeves, pants, a wide-brimmed hat, and sunglasses year round whenever you are outdoors.  Tell your health care provider of new moles or changes in moles, especially if there is a change in shape or color. Also, tell your health care provider if a mole is larger than the size  of a pencil eraser.  A one-time screening for abdominal aortic aneurysm (AAA) and surgical repair of large AAAs by ultrasound is recommended for men aged 60-75 years who are current or former smokers.  Stay current with your vaccines (immunizations). Document Released: 01/10/2008 Document Revised: 07/19/2013 Document Reviewed: 12/09/2010 [facility] Patient Information 2015 [place], [place]. This information is not intended to replace advice given to you by your health care provider. Make sure you discuss any questions you have with your health care provider.

## 2014-12-01 ENCOUNTER — Ambulatory Visit (INDEPENDENT_AMBULATORY_CARE_PROVIDER_SITE_OTHER): Payer: Medicare Other | Admitting: Family Medicine

## 2014-12-01 ENCOUNTER — Encounter: Payer: Self-pay | Admitting: Family Medicine

## 2014-12-01 VITALS — BP 112/67 | HR 68 | Temp 98.0°F | Ht 70.0 in | Wt 182.0 lb

## 2014-12-01 DIAGNOSIS — I959 Hypotension, unspecified: Secondary | ICD-10-CM

## 2014-12-01 DIAGNOSIS — M1000 Idiopathic gout, unspecified site: Secondary | ICD-10-CM | POA: Diagnosis not present

## 2014-12-01 DIAGNOSIS — E785 Hyperlipidemia, unspecified: Secondary | ICD-10-CM

## 2014-12-01 DIAGNOSIS — N183 Chronic kidney disease, stage 3 unspecified: Secondary | ICD-10-CM | POA: Insufficient documentation

## 2014-12-01 NOTE — Progress Notes (Signed)
Subjective:  Patient ID: Michael Cervantes, male    DOB: 06-Jun-1946  Age: 69 y.o. MRN: [id number]  CC: Hypotension   HPI Michael Cervantes presents for recheck of the hypotensive episodes. There have been none since his last check. Medicines are now agreeing with him without any type of syncope or chest pain or dizziness. He has had some gout pain in the right knee. Additionally he denies any change in his renal status with regard to urination or swelling.  Patient in for follow-up of elevated cholesterol. Doing well without complaints on current medication. Denies side effects of statin including myalgia and arthralgia and nausea. Also in today for liver function testing. Currently no chest pain, shortness of breath or other cardiovascular related symptoms noted.  History Michael Cervantes has a past medical history of Hypertension; Arthritis; OA (osteoarthritis) of foot; Anemia; Cellulitis (08/2014); Polycystic kidney disease; and Gout.   He has past surgical history that includes Cyst excision and Tonsillectomy.   His family history includes Arthritis in his sister; Cancer in his father; Dementia in his mother.He reports that he quit smoking about 25 years ago. His smokeless tobacco use includes Snuff. He reports that he does not drink alcohol or use illicit drugs.  Current Outpatient Prescriptions on File Prior to Visit  Medication Sig Dispense Refill  . allopurinol (ZYLOPRIM) 100 MG tablet Take 1 tablet (100 mg total) by mouth daily. 60 tablet 1  . colchicine 0.6 MG tablet Take 1 tablet (0.6 mg total) by mouth daily. 30 tablet 0  . furosemide (LASIX) 40 MG tablet Take 0.5 tablets (20 mg total) by mouth daily. (Patient taking differently: Take 40 mg by mouth daily. ) 30 tablet 5  . lisinopril (PRINIVIL,ZESTRIL) 10 MG tablet Take 10 mg by mouth daily.  1  . lovastatin (MEVACOR) 40 MG tablet Take 40 mg by mouth at bedtime.    . Multiple Vitamins-Minerals (ONE-A-DAY MENS HEALTH FORMULA PO) Take 1 tablet by  mouth daily.     No current facility-administered medications on file prior to visit.    ROS Review of Systems  Constitutional: Negative for fever, chills and diaphoresis.  HENT: Negative for congestion, rhinorrhea and sore throat.   Respiratory: Negative for cough, shortness of breath and wheezing.   Cardiovascular: Negative for chest pain.  Gastrointestinal: Negative for nausea, vomiting, abdominal pain, diarrhea, constipation and abdominal distention.  Genitourinary: Negative for dysuria and frequency.  Musculoskeletal: Negative for joint swelling and arthralgias.  Skin: Negative for rash.  Neurological: Negative for headaches.    Objective:  BP 112/67 mmHg  Pulse 68  Temp(Src) 98 F (36.7 C) (Oral)  Ht '5\' 10"'  (1.778 m)  Wt 182 lb (82.555 kg)  BMI 26.11 kg/m2  BP Readings from Last 3 Encounters:  12/01/14 112/67  11/22/14 124/74  10/19/14 110/67    Wt Readings from Last 3 Encounters:  12/01/14 182 lb (82.555 kg)  11/22/14 182 lb (82.555 kg)  10/19/14 183 lb 9.6 oz (83.28 kg)     Physical Exam  Constitutional: He is oriented to person, place, and time. He appears well-developed and well-nourished. No distress.  HENT:  Head: Normocephalic and atraumatic.  Right Ear: External ear normal.  Left Ear: External ear normal.  Nose: Nose normal.  Mouth/Throat: Oropharynx is clear and moist.  Eyes: Conjunctivae and EOM are normal. Pupils are equal, round, and reactive to light.  Neck: Normal range of motion. Neck supple. No thyromegaly present.  Cardiovascular: Normal rate, regular rhythm and normal heart sounds.  No murmur heard. Pulmonary/Chest: Effort normal and breath sounds normal. No respiratory distress. He has no wheezes. He has no rales.  Abdominal: Soft. Bowel sounds are normal. He exhibits no distension. There is no tenderness.  Lymphadenopathy:    He has no cervical adenopathy.  Neurological: He is alert and oriented to person, place, and time. He has  normal reflexes.  Skin: Skin is warm and dry.  Psychiatric: He has a normal mood and affect. His behavior is normal. Judgment and thought content normal.    No results found for: HGBA1C  Lab Results  Component Value Date   WBC 9.4 10/05/2014   HGB 11.9* 10/05/2014   HCT 38.2* 10/05/2014   PLT 231 09/15/2014   GLUCOSE 82 12/01/2014   CHOL 199 12/01/2014   TRIG 115 12/01/2014   HDL 53 12/01/2014   ALT 24 12/01/2014   AST 29 12/01/2014   NA 141 12/01/2014   K 4.6 12/01/2014   CL 100 12/01/2014   CREATININE 1.77* 12/01/2014   BUN 27 12/01/2014   CO2 25 12/01/2014   TSH 2.050 10/11/2014    Dg Chest 2 View  09/12/2014   CLINICAL DATA:  69 year old male with cough and congestion for 8 days. Initial encounter. Personal history former smoker.  EXAM: CHEST  2 VIEW  COMPARISON:  None.  FINDINGS: Upper limits of normal lung volumes. Normal cardiac size and mediastinal contours. Visualized tracheal air column is within normal limits. No pneumothorax, pulmonary edema, pleural effusion or confluent pulmonary opacity. No acute osseous abnormality identified.  IMPRESSION: No acute cardiopulmonary abnormality.   Electronically Signed   By: Genevie Ann M.D.   On: 09/12/2014 14:46   Dg Ankle Right Port  09/12/2014   CLINICAL DATA:  Right foot cellulitis initial evaluation, concern for septic arthritis right ankle joint  EXAM: PORTABLE RIGHT ANKLE - 2 VIEW  COMPARISON:  None.  FINDINGS: No fracture or dislocation. Mild tibiotalar arthritis with small anterior osteophyte. Increased attenuation surrounding the joint suggests possibility of small to moderate joint effusion. No evidence of osteomyelitis. No evidence of erosive change involving the osseous components of the ankle joint.  IMPRESSION: Probable ankle joint effusion.  No acute findings otherwise.   Electronically Signed   By: Skipper Cliche M.D.   On: 09/12/2014 19:13   Dg Foot Complete Right  09/12/2014   CLINICAL DATA:  Initial evaluation for  right foot cellulitis, concern for septic arthritis of the ankle joint  EXAM: RIGHT FOOT COMPLETE - 3+ VIEW  COMPARISON:  None.  FINDINGS: There appears to be a moderate ankle joint effusion. There is mild tibiotalar arthritis. No evidence of fracture or dislocation involving the bones of the right foot. No evidence of osteomyelitis or other acute findings.  IMPRESSION: Ankle joint effusion.  No acute findings otherwise.   Electronically Signed   By: Skipper Cliche M.D.   On: 09/12/2014 19:14    Assessment & Plan:   [place] was seen today for hypotension.  Diagnoses and all orders for this visit:  Hypotension, unspecified hypotension type Orders: -     CMP14+EGFR  Idiopathic gout, unspecified chronicity, unspecified site Orders: -     Uric acid -     CMP14+EGFR  Hyperlipidemia Orders: -     CMP14+EGFR -     NMR, lipoprofile  Chronic renal insufficiency, stage 3 (moderate) Orders: -     CMP14+EGFR  I am having Mr. Kessen maintain his lovastatin, colchicine, furosemide, lisinopril, allopurinol, and Multiple Vitamins-Minerals ([place]  FORMULA PO).  Meds ordered this encounter  Medications  . DISCONTD: furosemide (LASIX) 40 MG tablet    Sig: TAKE 1 TABLET (40 MG TOTAL) BY MOUTH DAILY.     Follow-up: Return in about 3 months (around 03/03/2015).  Claretta Fraise, M.D.

## 2014-12-02 LAB — NMR, LIPOPROFILE
Cholesterol: 199 mg/dL (ref 100–199)
HDL Cholesterol by NMR: 53 mg/dL (ref >39)
HDL PARTICLE NUMBER: 37.7 umol/L (ref >=30.5)
LDL Particle Number: 1713 nmol/L — ABNORMAL HIGH (ref <1000)
LDL Size: 20.4 nm (ref >20.5)
LDL-C: 123 mg/dL — ABNORMAL HIGH (ref 0–99)
LP-IR SCORE: 73 — AB (ref <=45)
Small LDL Particle Number: 906 nmol/L — ABNORMAL HIGH (ref <=527)
Triglycerides by NMR: 115 mg/dL (ref 0–149)

## 2014-12-02 LAB — URIC ACID: Uric Acid: 7.6 mg/dL (ref 3.7–8.6)

## 2014-12-02 LAB — CMP14+EGFR
A/G RATIO: 2 (ref 1.1–2.5)
ALT: 24 IU/L (ref 0–44)
AST: 29 IU/L (ref 0–40)
Albumin: 4.5 g/dL (ref 3.6–4.8)
Alkaline Phosphatase: 89 IU/L (ref 39–117)
BUN / CREAT RATIO: 15 (ref 10–22)
BUN: 27 mg/dL (ref 8–27)
Bilirubin Total: 0.4 mg/dL (ref 0.0–1.2)
CO2: 25 mmol/L (ref 18–29)
CREATININE: 1.77 mg/dL — AB (ref 0.76–1.27)
Calcium: 10.3 mg/dL — ABNORMAL HIGH (ref 8.6–10.2)
Chloride: 100 mmol/L (ref 97–108)
GFR calc Af Amer: 44 mL/min/{1.73_m2} — ABNORMAL LOW (ref >59)
GFR calc non Af Amer: 38 mL/min/{1.73_m2} — ABNORMAL LOW (ref >59)
Globulin, Total: 2.2 g/dL (ref 1.5–4.5)
Glucose: 82 mg/dL (ref 65–99)
POTASSIUM: 4.6 mmol/L (ref 3.5–5.2)
SODIUM: 141 mmol/L (ref 134–144)
Total Protein: 6.7 g/dL (ref 6.0–8.5)

## 2014-12-04 ENCOUNTER — Other Ambulatory Visit: Payer: Self-pay | Admitting: Family Medicine

## 2014-12-04 MED ORDER — ATORVASTATIN CALCIUM 40 MG PO TABS: 40.0000 mg | ORAL_TABLET | Freq: Every day | ORAL | Status: DC

## 2014-12-06 ENCOUNTER — Telehealth: Payer: Self-pay | Admitting: Family Medicine

## 2014-12-06 NOTE — Telephone Encounter (Signed)
Michael Cervantes aware of results

## 2015-01-14 ENCOUNTER — Other Ambulatory Visit: Payer: Self-pay | Admitting: Family Medicine

## 2015-01-15 DIAGNOSIS — N183 Chronic kidney disease, stage 3 (moderate): Secondary | ICD-10-CM | POA: Diagnosis not present

## 2015-01-15 DIAGNOSIS — I129 Hypertensive chronic kidney disease with stage 1 through stage 4 chronic kidney disease, or unspecified chronic kidney disease: Secondary | ICD-10-CM | POA: Diagnosis not present

## 2015-01-15 DIAGNOSIS — D631 Anemia in chronic kidney disease: Secondary | ICD-10-CM | POA: Diagnosis not present

## 2015-01-15 DIAGNOSIS — Z888 Allergy status to other drugs, medicaments and biological substances status: Secondary | ICD-10-CM | POA: Diagnosis not present

## 2015-01-15 DIAGNOSIS — E785 Hyperlipidemia, unspecified: Secondary | ICD-10-CM | POA: Diagnosis not present

## 2015-01-15 DIAGNOSIS — Z79899 Other long term (current) drug therapy: Secondary | ICD-10-CM | POA: Diagnosis not present

## 2015-01-15 DIAGNOSIS — Q612 Polycystic kidney, adult type: Secondary | ICD-10-CM | POA: Diagnosis not present

## 2015-01-30 ENCOUNTER — Encounter: Payer: Self-pay | Admitting: *Deleted

## 2015-02-08 ENCOUNTER — Other Ambulatory Visit: Payer: Self-pay | Admitting: Family Medicine

## 2015-03-05 ENCOUNTER — Ambulatory Visit: Payer: Medicare Other | Admitting: Family Medicine

## 2015-03-06 ENCOUNTER — Ambulatory Visit (INDEPENDENT_AMBULATORY_CARE_PROVIDER_SITE_OTHER): Payer: Medicare Other | Admitting: Family Medicine

## 2015-03-06 ENCOUNTER — Encounter: Payer: Self-pay | Admitting: Family Medicine

## 2015-03-06 VITALS — BP 117/67 | HR 59 | Temp 97.8°F | Ht 70.0 in | Wt 184.4 lb

## 2015-03-06 DIAGNOSIS — N183 Chronic kidney disease, stage 3 unspecified: Secondary | ICD-10-CM

## 2015-03-06 DIAGNOSIS — M1000 Idiopathic gout, unspecified site: Secondary | ICD-10-CM | POA: Diagnosis not present

## 2015-03-06 DIAGNOSIS — Q613 Polycystic kidney, unspecified: Secondary | ICD-10-CM | POA: Diagnosis not present

## 2015-03-06 DIAGNOSIS — E785 Hyperlipidemia, unspecified: Secondary | ICD-10-CM

## 2015-03-06 NOTE — Progress Notes (Signed)
Subjective:  Patient ID: Michael Cervantes, male    DOB: 1945-10-17  Age: 69 y.o. MRN: [id number]  CC: Hypertension and Hyperlipidemia   HPI QUINTERRIUS ERRINGTON presents for  follow-up of hypertension. Patient has no history of headache chest pain or shortness of breath or recent cough. Patient also denies symptoms of TIA such as numbness weakness lateralizing. Patient Not checking  blood pressure at home . Patient denies side effects from his medication. States taking it regularly.He has been followed by nephrology for his elevated creatinine. Patient in for follow-up of elevated cholesterol. Doing well without complaints on current medication. Denies side effects of statin including myalgia and arthralgia and nausea. Also in today for liver function testing. Currently no chest pain, shortness of breath or other cardiovascular related symptoms noted.  History Kayman has a past medical history of Hypertension; Arthritis; OA (osteoarthritis) of foot; Anemia; Cellulitis (08/2014); Polycystic kidney disease; and Gout.   He has past surgical history that includes Cyst excision and Tonsillectomy.   His family history includes Arthritis in his sister; Cancer in his father; Dementia in his mother.He reports that he quit smoking about 25 years ago. His smokeless tobacco use includes Snuff. He reports that he does not drink alcohol or use illicit drugs.  Outpatient Prescriptions Prior to Visit  Medication Sig Dispense Refill  . allopurinol (ZYLOPRIM) 100 MG tablet TAKE 1 TABLET (100 MG TOTAL) BY MOUTH DAILY. 60 tablet 1  . colchicine 0.6 MG tablet TAKE 2 TABS BY MOUTH NOW THEN 1 AN HOUR LATER, THEN 1 TWICE A DAY UNTIL COMPLETE RELIEF THEN 1 DAILY 40 tablet 4  . furosemide (LASIX) 40 MG tablet Take 0.5 tablets (20 mg total) by mouth daily. (Patient taking differently: Take 40 mg by mouth daily. ) 30 tablet 5  . lisinopril (PRINIVIL,ZESTRIL) 10 MG tablet Take 10 mg by mouth daily.  1  . Multiple  Vitamins-Minerals (ONE-A-DAY MENS HEALTH FORMULA PO) Take 1 tablet by mouth daily.    Marland Kitchen lovastatin (MEVACOR) 40 MG tablet Take 40 mg by mouth at bedtime.    Marland Kitchen atorvastatin (LIPITOR) 40 MG tablet Take 1 tablet (40 mg total) by mouth daily. For cholesterol (Patient not taking: Reported on 03/06/2015) 90 tablet 3   No facility-administered medications prior to visit.    ROS Review of Systems  Constitutional: Negative for fever, chills and diaphoresis.  HENT: Negative for congestion, rhinorrhea and sore throat.   Respiratory: Negative for cough, shortness of breath and wheezing.   Cardiovascular: Negative for chest pain.  Gastrointestinal: Negative for nausea, vomiting, abdominal pain, diarrhea, constipation and abdominal distention.  Genitourinary: Negative for dysuria and frequency.  Musculoskeletal: Negative for joint swelling and arthralgias.  Skin: Negative for rash.  Neurological: Negative for headaches.    Objective:  BP 117/67 mmHg  Pulse 59  Temp(Src) 97.8 F (36.6 C) (Oral)  Ht _0  (1.778 m)  Wt 184 lb 6.4 oz (83.643 kg)  BMI 26.46 kg/m2  BP Readings from Last 3 Encounters:  03/06/15 117/67  12/01/14 112/67  11/22/14 124/74    Wt Readings from Last 3 Encounters:  03/06/15 184 lb 6.4 oz (83.643 kg)  12/01/14 182 lb (82.555 kg)  11/22/14 182 lb (82.555 kg)     Physical Exam  Constitutional: He is oriented to person, place, and time. He appears well-developed and well-nourished. No distress.  HENT:  Head: Normocephalic and atraumatic.  Right Ear: External ear normal.  Left Ear: External ear normal.  Nose: Nose normal.  Mouth/Throat: Oropharynx is clear and moist.  Eyes: Conjunctivae and EOM are normal. Pupils are equal, round, and reactive to light.  Neck: Normal range of motion. Neck supple. No thyromegaly present.  Cardiovascular: Normal rate, regular rhythm and normal heart sounds.   No murmur heard. Pulmonary/Chest: Effort normal and breath sounds normal.  No respiratory distress. He has no wheezes. He has no rales.  Abdominal: Soft. Bowel sounds are normal. He exhibits no distension. There is no tenderness.  Lymphadenopathy:    He has no cervical adenopathy.  Neurological: He is alert and oriented to person, place, and time. He has normal reflexes.  Skin: Skin is warm and dry.  Psychiatric: He has a normal mood and affect. His behavior is normal. Judgment and thought content normal.    No results found for: HGBA1C  Lab Results  Component Value Date   WBC 9.4 10/05/2014   HGB 11.9* 10/05/2014   HCT 38.2* 10/05/2014   PLT 231 09/15/2014   GLUCOSE 82 12/01/2014   CHOL 199 12/01/2014   TRIG 115 12/01/2014   HDL 53 12/01/2014   ALT 24 12/01/2014   AST 29 12/01/2014   NA 141 12/01/2014   K 4.6 12/01/2014   CL 100 12/01/2014   CREATININE 1.77* 12/01/2014   BUN 27 12/01/2014   CO2 25 12/01/2014   TSH 2.050 10/11/2014    Dg Chest 2 View  09/12/2014   CLINICAL DATA:  69 year old male with cough and congestion for 8 days. Initial encounter. Personal history former smoker.  EXAM: CHEST  2 VIEW  COMPARISON:  None.  FINDINGS: Upper limits of normal lung volumes. Normal cardiac size and mediastinal contours. Visualized tracheal air column is within normal limits. No pneumothorax, pulmonary edema, pleural effusion or confluent pulmonary opacity. No acute osseous abnormality identified.  IMPRESSION: No acute cardiopulmonary abnormality.   Electronically Signed   By: Genevie Ann M.D.   On: 09/12/2014 14:46   Dg Ankle Right Port  09/12/2014   CLINICAL DATA:  Right foot cellulitis initial evaluation, concern for septic arthritis right ankle joint  EXAM: PORTABLE RIGHT ANKLE - 2 VIEW  COMPARISON:  None.  FINDINGS: No fracture or dislocation. Mild tibiotalar arthritis with small anterior osteophyte. Increased attenuation surrounding the joint suggests possibility of small to moderate joint effusion. No evidence of osteomyelitis. No evidence of erosive  change involving the osseous components of the ankle joint.  IMPRESSION: Probable ankle joint effusion.  No acute findings otherwise.   Electronically Signed   By: Skipper Cliche M.D.   On: 09/12/2014 19:13   Dg Foot Complete Right  09/12/2014   CLINICAL DATA:  Initial evaluation for right foot cellulitis, concern for septic arthritis of the ankle joint  EXAM: RIGHT FOOT COMPLETE - 3+ VIEW  COMPARISON:  None.  FINDINGS: There appears to be a moderate ankle joint effusion. There is mild tibiotalar arthritis. No evidence of fracture or dislocation involving the bones of the right foot. No evidence of osteomyelitis or other acute findings.  IMPRESSION: Ankle joint effusion.  No acute findings otherwise.   Electronically Signed   By: Skipper Cliche M.D.   On: 09/12/2014 19:14    Assessment & Plan:   Jacobs was seen today for hypertension and hyperlipidemia.  Diagnoses and all orders for this visit:  Hyperlipidemia Orders: -     CMP14+EGFR -     Lipid panel -     CBC with Differential/Platelet  Chronic renal insufficiency, stage 3 (moderate) Orders: -  CMP14+EGFR -     CBC with Differential/Platelet  Idiopathic gout, unspecified chronicity, unspecified site Orders: -     Cancel: POCT CBC -     CBC with Differential/Platelet  Polycystic kidney disease Orders: -     CBC with Differential/Platelet  I have discontinued Mr. Winrow lovastatin. I am also having him maintain his furosemide, lisinopril, Multiple Vitamins-Minerals (ONE-A-DAY MENS HEALTH FORMULA PO), atorvastatin, allopurinol, and colchicine.  No orders of the defined types were placed in this encounter.     Follow-up: Return in about 6 months (around 09/06/2015) for CPE.  Claretta Fraise, M.D.

## 2015-03-07 LAB — LIPID PANEL
CHOL/HDL RATIO: 4.6 ratio (ref 0.0–5.0)
Cholesterol, Total: 196 mg/dL (ref 100–199)
HDL: 43 mg/dL (ref >39)
LDL CALC: 124 mg/dL — AB (ref 0–99)
Triglycerides: 147 mg/dL (ref 0–149)
VLDL CHOLESTEROL CAL: 29 mg/dL (ref 5–40)

## 2015-03-07 LAB — CMP14+EGFR
A/G RATIO: 1.8 (ref 1.1–2.5)
ALK PHOS: 79 IU/L (ref 39–117)
ALT: 18 IU/L (ref 0–44)
AST: 20 IU/L (ref 0–40)
Albumin: 4.4 g/dL (ref 3.6–4.8)
BUN / CREAT RATIO: 17 (ref 10–22)
BUN: 35 mg/dL — ABNORMAL HIGH (ref 8–27)
Bilirubin Total: 0.4 mg/dL (ref 0.0–1.2)
CO2: 25 mmol/L (ref 18–29)
Calcium: 10.4 mg/dL — ABNORMAL HIGH (ref 8.6–10.2)
Chloride: 102 mmol/L (ref 97–108)
Creatinine, Ser: 2.08 mg/dL — ABNORMAL HIGH (ref 0.76–1.27)
GFR calc Af Amer: 36 mL/min/{1.73_m2} — ABNORMAL LOW (ref >59)
GFR calc non Af Amer: 32 mL/min/{1.73_m2} — ABNORMAL LOW (ref >59)
GLOBULIN, TOTAL: 2.4 g/dL (ref 1.5–4.5)
Glucose: 93 mg/dL (ref 65–99)
Potassium: 4.9 mmol/L (ref 3.5–5.2)
SODIUM: 144 mmol/L (ref 134–144)
Total Protein: 6.8 g/dL (ref 6.0–8.5)

## 2015-03-07 LAB — CBC WITH DIFFERENTIAL/PLATELET
Basophils Absolute: 0 10*3/uL (ref 0.0–0.2)
Basos: 0 %
EOS (ABSOLUTE): 0.3 10*3/uL (ref 0.0–0.4)
Eos: 4 %
Hematocrit: 35.6 % — ABNORMAL LOW (ref 37.5–51.0)
Hemoglobin: 11.9 g/dL — ABNORMAL LOW (ref 12.6–17.7)
Immature Grans (Abs): 0 10*3/uL (ref 0.0–0.1)
Immature Granulocytes: 0 %
Lymphocytes Absolute: 1.6 10*3/uL (ref 0.7–3.1)
Lymphs: 21 %
MCH: 31.2 pg (ref 26.6–33.0)
MCHC: 33.4 g/dL (ref 31.5–35.7)
MCV: 93 fL (ref 79–97)
MONOS ABS: 0.9 10*3/uL (ref 0.1–0.9)
Monocytes: 12 %
NEUTROS PCT: 63 %
Neutrophils Absolute: 4.7 10*3/uL (ref 1.4–7.0)
PLATELETS: 229 10*3/uL (ref 150–379)
RBC: 3.82 x10E6/uL — ABNORMAL LOW (ref 4.14–5.80)
RDW: 14.7 % (ref 12.3–15.4)
WBC: 7.6 10*3/uL (ref 3.4–10.8)

## 2015-03-09 ENCOUNTER — Other Ambulatory Visit: Payer: Self-pay | Admitting: *Deleted

## 2015-03-09 MED ORDER — ATORVASTATIN CALCIUM 40 MG PO TABS: 40.0000 mg | ORAL_TABLET | Freq: Every day | ORAL | Status: DC

## 2015-03-26 DIAGNOSIS — H40033 Anatomical narrow angle, bilateral: Secondary | ICD-10-CM | POA: Diagnosis not present

## 2015-03-26 DIAGNOSIS — H2513 Age-related nuclear cataract, bilateral: Secondary | ICD-10-CM | POA: Diagnosis not present

## 2015-05-11 ENCOUNTER — Other Ambulatory Visit: Payer: Self-pay | Admitting: Family Medicine

## 2015-06-18 ENCOUNTER — Ambulatory Visit (INDEPENDENT_AMBULATORY_CARE_PROVIDER_SITE_OTHER): Payer: Medicare Other

## 2015-06-18 DIAGNOSIS — Z23 Encounter for immunization: Secondary | ICD-10-CM

## 2015-06-18 NOTE — Progress Notes (Signed)
fluarix and zostavav given pt tolerated well

## 2015-07-09 DIAGNOSIS — S338XXA Sprain of other parts of lumbar spine and pelvis, initial encounter: Secondary | ICD-10-CM | POA: Diagnosis not present

## 2015-07-09 DIAGNOSIS — M9903 Segmental and somatic dysfunction of lumbar region: Secondary | ICD-10-CM | POA: Diagnosis not present

## 2015-07-09 DIAGNOSIS — M5442 Lumbago with sciatica, left side: Secondary | ICD-10-CM | POA: Diagnosis not present

## 2015-07-10 DIAGNOSIS — S338XXA Sprain of other parts of lumbar spine and pelvis, initial encounter: Secondary | ICD-10-CM | POA: Diagnosis not present

## 2015-07-10 DIAGNOSIS — M9903 Segmental and somatic dysfunction of lumbar region: Secondary | ICD-10-CM | POA: Diagnosis not present

## 2015-07-10 DIAGNOSIS — M546 Pain in thoracic spine: Secondary | ICD-10-CM | POA: Diagnosis not present

## 2015-07-10 DIAGNOSIS — M5442 Lumbago with sciatica, left side: Secondary | ICD-10-CM | POA: Diagnosis not present

## 2015-07-10 DIAGNOSIS — M47816 Spondylosis without myelopathy or radiculopathy, lumbar region: Secondary | ICD-10-CM | POA: Diagnosis not present

## 2015-07-12 DIAGNOSIS — S338XXA Sprain of other parts of lumbar spine and pelvis, initial encounter: Secondary | ICD-10-CM | POA: Diagnosis not present

## 2015-07-12 DIAGNOSIS — M546 Pain in thoracic spine: Secondary | ICD-10-CM | POA: Diagnosis not present

## 2015-07-12 DIAGNOSIS — M5442 Lumbago with sciatica, left side: Secondary | ICD-10-CM | POA: Diagnosis not present

## 2015-07-12 DIAGNOSIS — M9903 Segmental and somatic dysfunction of lumbar region: Secondary | ICD-10-CM | POA: Diagnosis not present

## 2015-07-12 DIAGNOSIS — M47816 Spondylosis without myelopathy or radiculopathy, lumbar region: Secondary | ICD-10-CM | POA: Diagnosis not present

## 2015-07-13 DIAGNOSIS — M9903 Segmental and somatic dysfunction of lumbar region: Secondary | ICD-10-CM | POA: Diagnosis not present

## 2015-07-13 DIAGNOSIS — M5442 Lumbago with sciatica, left side: Secondary | ICD-10-CM | POA: Diagnosis not present

## 2015-07-13 DIAGNOSIS — S338XXA Sprain of other parts of lumbar spine and pelvis, initial encounter: Secondary | ICD-10-CM | POA: Diagnosis not present

## 2015-07-13 DIAGNOSIS — M47816 Spondylosis without myelopathy or radiculopathy, lumbar region: Secondary | ICD-10-CM | POA: Diagnosis not present

## 2015-07-13 DIAGNOSIS — M546 Pain in thoracic spine: Secondary | ICD-10-CM | POA: Diagnosis not present

## 2015-07-16 DIAGNOSIS — S338XXA Sprain of other parts of lumbar spine and pelvis, initial encounter: Secondary | ICD-10-CM | POA: Diagnosis not present

## 2015-07-16 DIAGNOSIS — M546 Pain in thoracic spine: Secondary | ICD-10-CM | POA: Diagnosis not present

## 2015-07-16 DIAGNOSIS — M9903 Segmental and somatic dysfunction of lumbar region: Secondary | ICD-10-CM | POA: Diagnosis not present

## 2015-07-16 DIAGNOSIS — M5442 Lumbago with sciatica, left side: Secondary | ICD-10-CM | POA: Diagnosis not present

## 2015-07-16 DIAGNOSIS — M47816 Spondylosis without myelopathy or radiculopathy, lumbar region: Secondary | ICD-10-CM | POA: Diagnosis not present

## 2015-07-18 DIAGNOSIS — M546 Pain in thoracic spine: Secondary | ICD-10-CM | POA: Diagnosis not present

## 2015-07-18 DIAGNOSIS — M5442 Lumbago with sciatica, left side: Secondary | ICD-10-CM | POA: Diagnosis not present

## 2015-07-18 DIAGNOSIS — M47816 Spondylosis without myelopathy or radiculopathy, lumbar region: Secondary | ICD-10-CM | POA: Diagnosis not present

## 2015-07-18 DIAGNOSIS — M9903 Segmental and somatic dysfunction of lumbar region: Secondary | ICD-10-CM | POA: Diagnosis not present

## 2015-07-18 DIAGNOSIS — S338XXA Sprain of other parts of lumbar spine and pelvis, initial encounter: Secondary | ICD-10-CM | POA: Diagnosis not present

## 2015-07-19 DIAGNOSIS — M5442 Lumbago with sciatica, left side: Secondary | ICD-10-CM | POA: Diagnosis not present

## 2015-07-19 DIAGNOSIS — M47816 Spondylosis without myelopathy or radiculopathy, lumbar region: Secondary | ICD-10-CM | POA: Diagnosis not present

## 2015-07-19 DIAGNOSIS — M546 Pain in thoracic spine: Secondary | ICD-10-CM | POA: Diagnosis not present

## 2015-07-19 DIAGNOSIS — S338XXA Sprain of other parts of lumbar spine and pelvis, initial encounter: Secondary | ICD-10-CM | POA: Diagnosis not present

## 2015-07-19 DIAGNOSIS — M9903 Segmental and somatic dysfunction of lumbar region: Secondary | ICD-10-CM | POA: Diagnosis not present

## 2015-07-20 DIAGNOSIS — S338XXA Sprain of other parts of lumbar spine and pelvis, initial encounter: Secondary | ICD-10-CM | POA: Diagnosis not present

## 2015-07-20 DIAGNOSIS — M9903 Segmental and somatic dysfunction of lumbar region: Secondary | ICD-10-CM | POA: Diagnosis not present

## 2015-07-20 DIAGNOSIS — M47816 Spondylosis without myelopathy or radiculopathy, lumbar region: Secondary | ICD-10-CM | POA: Diagnosis not present

## 2015-07-20 DIAGNOSIS — M546 Pain in thoracic spine: Secondary | ICD-10-CM | POA: Diagnosis not present

## 2015-07-20 DIAGNOSIS — M5442 Lumbago with sciatica, left side: Secondary | ICD-10-CM | POA: Diagnosis not present

## 2015-07-24 DIAGNOSIS — M9903 Segmental and somatic dysfunction of lumbar region: Secondary | ICD-10-CM | POA: Diagnosis not present

## 2015-07-24 DIAGNOSIS — M47816 Spondylosis without myelopathy or radiculopathy, lumbar region: Secondary | ICD-10-CM | POA: Diagnosis not present

## 2015-07-24 DIAGNOSIS — M5442 Lumbago with sciatica, left side: Secondary | ICD-10-CM | POA: Diagnosis not present

## 2015-07-24 DIAGNOSIS — M546 Pain in thoracic spine: Secondary | ICD-10-CM | POA: Diagnosis not present

## 2015-07-24 DIAGNOSIS — S338XXA Sprain of other parts of lumbar spine and pelvis, initial encounter: Secondary | ICD-10-CM | POA: Diagnosis not present

## 2015-07-26 ENCOUNTER — Telehealth: Payer: Self-pay | Admitting: Family Medicine

## 2015-07-26 DIAGNOSIS — M47816 Spondylosis without myelopathy or radiculopathy, lumbar region: Secondary | ICD-10-CM | POA: Diagnosis not present

## 2015-07-26 DIAGNOSIS — M546 Pain in thoracic spine: Secondary | ICD-10-CM | POA: Diagnosis not present

## 2015-07-26 DIAGNOSIS — S338XXA Sprain of other parts of lumbar spine and pelvis, initial encounter: Secondary | ICD-10-CM | POA: Diagnosis not present

## 2015-07-26 DIAGNOSIS — M9903 Segmental and somatic dysfunction of lumbar region: Secondary | ICD-10-CM | POA: Diagnosis not present

## 2015-07-26 DIAGNOSIS — M5442 Lumbago with sciatica, left side: Secondary | ICD-10-CM | POA: Diagnosis not present

## 2015-07-26 NOTE — Telephone Encounter (Signed)
Appointment given for tomorrow with Evette Doffing.

## 2015-07-27 ENCOUNTER — Encounter: Payer: Self-pay | Admitting: Pediatrics

## 2015-07-27 ENCOUNTER — Ambulatory Visit (INDEPENDENT_AMBULATORY_CARE_PROVIDER_SITE_OTHER): Payer: Medicare Other | Admitting: Pediatrics

## 2015-07-27 VITALS — BP 128/79 | HR 77 | Temp 96.2°F | Ht 70.0 in | Wt 185.2 lb

## 2015-07-27 DIAGNOSIS — M5432 Sciatica, left side: Secondary | ICD-10-CM | POA: Diagnosis not present

## 2015-07-27 MED ORDER — PREDNISONE 10 MG (21) PO TBPK: ORAL_TABLET | ORAL | Status: DC

## 2015-07-27 MED ORDER — PREDNISONE 10 MG PO TABS: ORAL_TABLET | ORAL | Status: DC

## 2015-07-27 NOTE — Progress Notes (Signed)
Subjective:    Patient ID: Michael Cervantes, male    DOB: 1946-01-16, 69 y.o.   MRN: [id number]  CC: Back Pain   HPI: Michael Cervantes is a 69 y.o. male presenting for Back Pain  For past several weeks has been having pain shooting into L buttock and leg.  Has had this pain in the past, went away with a steroid shot. No fevers, no URI symptoms No weakness in legs b/l No incontinence of stool or bowel   Depression screen [facility] 2/9 07/27/2015 03/06/2015 12/01/2014 11/22/2014 10/05/2014  Decreased Interest 0 0 0 0 0  Down, Depressed, Hopeless 0 0 0 0 0  PHQ - 2 Score 0 0 0 0 0     Relevant past medical, surgical, family and social history reviewed and updated as indicated. Interim medical history since our last visit reviewed. Allergies and medications reviewed and updated.    ROS: Per HPI unless specifically indicated above  History  Smoking status  . Former Smoker  . Quit date: 07/28/1989  Smokeless tobacco  . Current User  . Types: Snuff    Past Medical History Patient Active Problem List   Diagnosis Date Noted  . Chronic kidney disease (CKD), stage III (moderate) 12/01/2014  . HLD (hyperlipidemia) 12/01/2014  . Polycystic kidney disease   . Gout   . 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 Anemia associated with chronic renal failure 10/01/2011    Current Outpatient Prescriptions  Medication Sig Dispense Refill  . allopurinol (ZYLOPRIM) 100 MG tablet TAKE 1 TABLET (100 MG TOTAL) BY MOUTH DAILY. 60 tablet 3  . atorvastatin (LIPITOR) 40 MG tablet Take 1 tablet (40 mg total) by mouth daily. For cholesterol 90 tablet 3  . furosemide (LASIX) 40 MG tablet Take 0.5 tablets (20 mg total) by mouth daily. (Patient taking differently: Take 40 mg by mouth daily. ) 30 tablet 5  . lisinopril (PRINIVIL,ZESTRIL) 10 MG tablet Take 10 mg by mouth daily.  1  . Multiple Vitamins-Minerals (ONE-A-DAY MENS HEALTH FORMULA PO) Take 1 tablet by mouth daily.    . colchicine  0.6 MG tablet TAKE 2 TABS BY MOUTH NOW THEN 1 AN HOUR LATER, THEN 1 TWICE A DAY UNTIL COMPLETE RELIEF THEN 1 DAILY (Patient not taking: Reported on 07/27/2015) 40 tablet 4  . predniSONE (DELTASONE) 10 MG tablet Take 5 daily for 3 days followed by 4,3,2 and 1 for 3 days each. 45 tablet 0   No current facility-administered medications for this visit.       Objective:    BP 128/79 mmHg  Pulse 77  Temp(Src) 96.2 F (35.7 C) (Oral)  Ht 5\' 10"  (1.778 m)  Wt 185 lb 3.2 oz (84.006 kg)  BMI 26.57 kg/m2  Wt Readings from Last 3 Encounters:  07/27/15 185 lb 3.2 oz (84.006 kg)  03/06/15 184 lb 6.4 oz (83.643 kg)  12/01/14 182 lb (82.555 kg)    Gen: NAD, alert, cooperative with exam, NCAT EYES: EOMI, no scleral injection or icterus ENT:  TMs pearly gray b/l, OP without erythema LYMPH: no cervical LAD CV: NRRR, normal S1/S2, no murmur, distal pulses 2+ b/l Resp: CTABL, no wheezes, normal WOB Abd: +BS, soft, NTND. no guarding or organomegaly Ext: No edema, warm Neuro: Alert and oriented, strength equal b/l UE and LE, coordination grossly normal, patellar reflexes equal b/l, sensation intact b/l, no back pain with SLR b/l MSK: normal muscle bulk     Assessment &  Plan:    Briana was seen today for sciatica L side. Discussed pathophysiology of disease, often comes and goes. Will do prednisone taper. Gave gentle back exercises to do. REturn precautions given. F/u in 4-6 weeks as needed. Avoid NSAIDs with PCKD.  Diagnoses and all orders for this visit:  Sciatica of left side -     predniSONE (DELTASONE) 10 MG tablet; Take 5 daily for 3 days followed by 4,3,2 and 1 for 3 days each.    Follow up plan: Return in about 6 weeks (around 09/07/2015).  Assunta Found, MD [facility] 07/27/2015, 3:54 PM

## 2015-07-30 DIAGNOSIS — S338XXA Sprain of other parts of lumbar spine and pelvis, initial encounter: Secondary | ICD-10-CM | POA: Diagnosis not present

## 2015-07-30 DIAGNOSIS — M5442 Lumbago with sciatica, left side: Secondary | ICD-10-CM | POA: Diagnosis not present

## 2015-07-30 DIAGNOSIS — M9903 Segmental and somatic dysfunction of lumbar region: Secondary | ICD-10-CM | POA: Diagnosis not present

## 2015-07-30 DIAGNOSIS — M47816 Spondylosis without myelopathy or radiculopathy, lumbar region: Secondary | ICD-10-CM | POA: Diagnosis not present

## 2015-07-30 DIAGNOSIS — M546 Pain in thoracic spine: Secondary | ICD-10-CM | POA: Diagnosis not present

## 2015-08-02 DIAGNOSIS — I129 Hypertensive chronic kidney disease with stage 1 through stage 4 chronic kidney disease, or unspecified chronic kidney disease: Secondary | ICD-10-CM | POA: Diagnosis not present

## 2015-08-02 DIAGNOSIS — M47816 Spondylosis without myelopathy or radiculopathy, lumbar region: Secondary | ICD-10-CM | POA: Diagnosis not present

## 2015-08-02 DIAGNOSIS — M9903 Segmental and somatic dysfunction of lumbar region: Secondary | ICD-10-CM | POA: Diagnosis not present

## 2015-08-02 DIAGNOSIS — Z79899 Other long term (current) drug therapy: Secondary | ICD-10-CM | POA: Diagnosis not present

## 2015-08-02 DIAGNOSIS — N2581 Secondary hyperparathyroidism of renal origin: Secondary | ICD-10-CM | POA: Diagnosis not present

## 2015-08-02 DIAGNOSIS — S338XXA Sprain of other parts of lumbar spine and pelvis, initial encounter: Secondary | ICD-10-CM | POA: Diagnosis not present

## 2015-08-02 DIAGNOSIS — N183 Chronic kidney disease, stage 3 (moderate): Secondary | ICD-10-CM | POA: Diagnosis not present

## 2015-08-02 DIAGNOSIS — M546 Pain in thoracic spine: Secondary | ICD-10-CM | POA: Diagnosis not present

## 2015-08-02 DIAGNOSIS — D631 Anemia in chronic kidney disease: Secondary | ICD-10-CM | POA: Diagnosis not present

## 2015-08-02 DIAGNOSIS — M5442 Lumbago with sciatica, left side: Secondary | ICD-10-CM | POA: Diagnosis not present

## 2015-08-02 DIAGNOSIS — Z888 Allergy status to other drugs, medicaments and biological substances status: Secondary | ICD-10-CM | POA: Diagnosis not present

## 2015-08-02 DIAGNOSIS — Q613 Polycystic kidney, unspecified: Secondary | ICD-10-CM | POA: Diagnosis not present

## 2015-08-07 DIAGNOSIS — M5442 Lumbago with sciatica, left side: Secondary | ICD-10-CM | POA: Diagnosis not present

## 2015-08-07 DIAGNOSIS — S338XXA Sprain of other parts of lumbar spine and pelvis, initial encounter: Secondary | ICD-10-CM | POA: Diagnosis not present

## 2015-08-07 DIAGNOSIS — M546 Pain in thoracic spine: Secondary | ICD-10-CM | POA: Diagnosis not present

## 2015-08-07 DIAGNOSIS — M47816 Spondylosis without myelopathy or radiculopathy, lumbar region: Secondary | ICD-10-CM | POA: Diagnosis not present

## 2015-08-07 DIAGNOSIS — M9903 Segmental and somatic dysfunction of lumbar region: Secondary | ICD-10-CM | POA: Diagnosis not present

## 2015-08-09 DIAGNOSIS — M546 Pain in thoracic spine: Secondary | ICD-10-CM | POA: Diagnosis not present

## 2015-08-09 DIAGNOSIS — M5442 Lumbago with sciatica, left side: Secondary | ICD-10-CM | POA: Diagnosis not present

## 2015-08-09 DIAGNOSIS — M47816 Spondylosis without myelopathy or radiculopathy, lumbar region: Secondary | ICD-10-CM | POA: Diagnosis not present

## 2015-08-09 DIAGNOSIS — S338XXA Sprain of other parts of lumbar spine and pelvis, initial encounter: Secondary | ICD-10-CM | POA: Diagnosis not present

## 2015-08-09 DIAGNOSIS — M9903 Segmental and somatic dysfunction of lumbar region: Secondary | ICD-10-CM | POA: Diagnosis not present

## 2015-08-14 DIAGNOSIS — S338XXA Sprain of other parts of lumbar spine and pelvis, initial encounter: Secondary | ICD-10-CM | POA: Diagnosis not present

## 2015-08-14 DIAGNOSIS — M5442 Lumbago with sciatica, left side: Secondary | ICD-10-CM | POA: Diagnosis not present

## 2015-08-14 DIAGNOSIS — M546 Pain in thoracic spine: Secondary | ICD-10-CM | POA: Diagnosis not present

## 2015-08-14 DIAGNOSIS — M47816 Spondylosis without myelopathy or radiculopathy, lumbar region: Secondary | ICD-10-CM | POA: Diagnosis not present

## 2015-08-14 DIAGNOSIS — M9903 Segmental and somatic dysfunction of lumbar region: Secondary | ICD-10-CM | POA: Diagnosis not present

## 2015-08-21 DIAGNOSIS — M546 Pain in thoracic spine: Secondary | ICD-10-CM | POA: Diagnosis not present

## 2015-08-21 DIAGNOSIS — M5442 Lumbago with sciatica, left side: Secondary | ICD-10-CM | POA: Diagnosis not present

## 2015-08-21 DIAGNOSIS — M47816 Spondylosis without myelopathy or radiculopathy, lumbar region: Secondary | ICD-10-CM | POA: Diagnosis not present

## 2015-08-21 DIAGNOSIS — M9903 Segmental and somatic dysfunction of lumbar region: Secondary | ICD-10-CM | POA: Diagnosis not present

## 2015-08-21 DIAGNOSIS — S338XXA Sprain of other parts of lumbar spine and pelvis, initial encounter: Secondary | ICD-10-CM | POA: Diagnosis not present

## 2015-08-24 ENCOUNTER — Other Ambulatory Visit: Payer: Medicare Other

## 2015-08-24 DIAGNOSIS — N181 Chronic kidney disease, stage 1: Secondary | ICD-10-CM | POA: Diagnosis not present

## 2015-08-25 LAB — BMP8+EGFR
BUN/Creatinine Ratio: 20 (ref 10–22)
BUN: 47 mg/dL — ABNORMAL HIGH (ref 8–27)
CALCIUM: 9.9 mg/dL (ref 8.6–10.2)
CHLORIDE: 99 mmol/L (ref 96–106)
CO2: 21 mmol/L (ref 18–29)
Creatinine, Ser: 2.3 mg/dL — ABNORMAL HIGH (ref 0.76–1.27)
GFR calc non Af Amer: 28 mL/min/{1.73_m2} — ABNORMAL LOW (ref >59)
GFR, EST AFRICAN AMERICAN: 32 mL/min/{1.73_m2} — AB (ref >59)
Glucose: 95 mg/dL (ref 65–99)
POTASSIUM: 4.6 mmol/L (ref 3.5–5.2)
Sodium: 140 mmol/L (ref 134–144)

## 2015-08-28 DIAGNOSIS — S338XXA Sprain of other parts of lumbar spine and pelvis, initial encounter: Secondary | ICD-10-CM | POA: Diagnosis not present

## 2015-08-28 DIAGNOSIS — M5442 Lumbago with sciatica, left side: Secondary | ICD-10-CM | POA: Diagnosis not present

## 2015-08-28 DIAGNOSIS — M9903 Segmental and somatic dysfunction of lumbar region: Secondary | ICD-10-CM | POA: Diagnosis not present

## 2015-08-28 DIAGNOSIS — M47816 Spondylosis without myelopathy or radiculopathy, lumbar region: Secondary | ICD-10-CM | POA: Diagnosis not present

## 2015-08-28 DIAGNOSIS — M546 Pain in thoracic spine: Secondary | ICD-10-CM | POA: Diagnosis not present

## 2015-09-05 DIAGNOSIS — M546 Pain in thoracic spine: Secondary | ICD-10-CM | POA: Diagnosis not present

## 2015-09-05 DIAGNOSIS — S338XXA Sprain of other parts of lumbar spine and pelvis, initial encounter: Secondary | ICD-10-CM | POA: Diagnosis not present

## 2015-09-05 DIAGNOSIS — M5442 Lumbago with sciatica, left side: Secondary | ICD-10-CM | POA: Diagnosis not present

## 2015-09-05 DIAGNOSIS — M47816 Spondylosis without myelopathy or radiculopathy, lumbar region: Secondary | ICD-10-CM | POA: Diagnosis not present

## 2015-09-05 DIAGNOSIS — M9903 Segmental and somatic dysfunction of lumbar region: Secondary | ICD-10-CM | POA: Diagnosis not present

## 2015-09-10 ENCOUNTER — Ambulatory Visit (INDEPENDENT_AMBULATORY_CARE_PROVIDER_SITE_OTHER): Payer: Medicare Other

## 2015-09-10 ENCOUNTER — Ambulatory Visit (INDEPENDENT_AMBULATORY_CARE_PROVIDER_SITE_OTHER): Payer: Medicare Other | Admitting: Family Medicine

## 2015-09-10 ENCOUNTER — Encounter: Payer: Self-pay | Admitting: Family Medicine

## 2015-09-10 VITALS — BP 100/62 | HR 68 | Temp 97.1°F | Ht 70.0 in | Wt 189.2 lb

## 2015-09-10 DIAGNOSIS — R05 Cough: Secondary | ICD-10-CM

## 2015-09-10 DIAGNOSIS — M5442 Lumbago with sciatica, left side: Secondary | ICD-10-CM | POA: Diagnosis not present

## 2015-09-10 DIAGNOSIS — E785 Hyperlipidemia, unspecified: Secondary | ICD-10-CM

## 2015-09-10 DIAGNOSIS — M1A0790 Idiopathic chronic gout, unspecified ankle and foot, without tophus (tophi): Secondary | ICD-10-CM | POA: Diagnosis not present

## 2015-09-10 DIAGNOSIS — M5432 Sciatica, left side: Secondary | ICD-10-CM

## 2015-09-10 DIAGNOSIS — Z1212 Encounter for screening for malignant neoplasm of rectum: Secondary | ICD-10-CM | POA: Diagnosis not present

## 2015-09-10 DIAGNOSIS — E559 Vitamin D deficiency, unspecified: Secondary | ICD-10-CM | POA: Diagnosis not present

## 2015-09-10 DIAGNOSIS — M25562 Pain in left knee: Secondary | ICD-10-CM

## 2015-09-10 DIAGNOSIS — R059 Cough, unspecified: Secondary | ICD-10-CM

## 2015-09-10 DIAGNOSIS — Z125 Encounter for screening for malignant neoplasm of prostate: Secondary | ICD-10-CM | POA: Diagnosis not present

## 2015-09-10 DIAGNOSIS — Q613 Polycystic kidney, unspecified: Secondary | ICD-10-CM

## 2015-09-10 MED ORDER — FUROSEMIDE 40 MG PO TABS: 40.0000 mg | ORAL_TABLET | Freq: Every day | ORAL | Status: DC

## 2015-09-10 MED ORDER — ALLOPURINOL 100 MG PO TABS: ORAL_TABLET | ORAL | Status: DC

## 2015-09-10 MED ORDER — LISINOPRIL 10 MG PO TABS: 10.0000 mg | ORAL_TABLET | Freq: Every day | ORAL | Status: DC

## 2015-09-10 NOTE — Addendum Note (Signed)
Addended by: Selmer Dominion on: 09/10/2015 03:40 PM   Modules accepted: Orders

## 2015-09-10 NOTE — Progress Notes (Signed)
Subjective:  Patient ID: Michael Cervantes, male    DOB: 1946-03-10  Age: 70 y.o. MRN: [id number]  CC: Annual Exam; Hypertension; Hyperlipidemia; and Gout   HPI Michael Cervantes presents for CPE. Low grade HA for 2-3 weeks. All around the head. Dull. Steady. Constant. Naproxyn gives full but temporary  Relief. No affect on vision. No nausea. Pain in left buttocks radiates to calf, thigh. 2/10.    History Michael Cervantes has a past medical history of Hypertension; Arthritis; OA (osteoarthritis) of foot; Anemia; Cellulitis (08/2014); Polycystic kidney disease; and Gout.   He has past surgical history that includes Cyst excision and Tonsillectomy.   His family history includes Arthritis in his sister; Cancer in his father; Dementia in his mother.He reports that he quit smoking about 26 years ago. His smokeless tobacco use includes Snuff. He reports that he does not drink alcohol or use illicit drugs.    ROS Review of Systems  Constitutional: Negative for fever, chills, diaphoresis, activity change, appetite change, fatigue and unexpected weight change.  HENT: Negative for congestion, ear pain, hearing loss, postnasal drip, rhinorrhea, sore throat, tinnitus and trouble swallowing.   Eyes: Negative for photophobia, pain, discharge and redness.  Respiratory: Positive for cough (occasional hacking - remote smoking hx). Negative for apnea, choking, chest tightness, shortness of breath, wheezing and stridor.   Cardiovascular: Negative for chest pain, palpitations and leg swelling.  Gastrointestinal: Negative for nausea, vomiting, abdominal pain, diarrhea, constipation, blood in stool and abdominal distention.  Endocrine: Negative for cold intolerance, heat intolerance, polydipsia, polyphagia and polyuria.  Genitourinary: Negative for dysuria, urgency, frequency, hematuria, flank pain, enuresis, difficulty urinating and genital sores.  Musculoskeletal: Positive for arthralgias (low back, radiating to left. LEft  knee pain & stiffness). Negative for joint swelling.  Skin: Negative for color change, rash and wound.  Allergic/Immunologic: Negative for immunocompromised state.  Neurological: Negative for dizziness, tremors, seizures, syncope, facial asymmetry, speech difficulty, weakness, light-headedness, numbness and headaches.  Hematological: Does not bruise/bleed easily.  Psychiatric/Behavioral: Negative for suicidal ideas, hallucinations, behavioral problems, confusion, sleep disturbance, dysphoric mood, decreased concentration and agitation. The patient is not nervous/anxious and is not hyperactive.     Objective:  BP 100/62 mmHg  Pulse 68  Temp(Src) 97.1 F (36.2 C) (Oral)  Ht '5\' 10"'$  (1.778 m)  Wt 189 lb 3.2 oz (85.821 kg)  BMI 27.15 kg/m2  SpO2 99%  BP Readings from Last 3 Encounters:  09/10/15 100/62  07/27/15 128/79  03/06/15 117/67    Wt Readings from Last 3 Encounters:  09/10/15 189 lb 3.2 oz (85.821 kg)  07/27/15 185 lb 3.2 oz (84.006 kg)  03/06/15 184 lb 6.4 oz (83.643 kg)     Physical Exam  Constitutional: He is oriented to person, place, and time. He appears well-developed and well-nourished.  HENT:  Head: Normocephalic and atraumatic.  Mouth/Throat: Oropharynx is clear and moist.  Eyes: EOM are normal. Pupils are equal, round, and reactive to light.  Neck: Normal range of motion. No tracheal deviation present. No thyromegaly present.  Cardiovascular: Normal rate, regular rhythm and normal heart sounds.  Exam reveals no gallop and no friction rub.   No murmur heard. Pulmonary/Chest: Breath sounds normal. He has no wheezes. He has no rales.  Abdominal: Soft. He exhibits no mass. There is no tenderness.  Genitourinary: Rectum normal, prostate normal and penis normal. No penile tenderness.  Musculoskeletal: Normal range of motion. He exhibits no edema.  Neurological: He is alert and oriented to person, place, and time.  He has normal reflexes. No cranial nerve deficit. He  exhibits normal muscle tone.  Skin: Skin is warm and dry.  Psychiatric: He has a normal mood and affect.     Lab Results  Component Value Date   WBC 7.6 03/06/2015   HGB 11.9* 10/05/2014   HCT 35.6* 03/06/2015   PLT 229 03/06/2015   GLUCOSE 95 08/24/2015   CHOL 196 03/06/2015   TRIG 147 03/06/2015   HDL 43 03/06/2015   LDLCALC 124* 03/06/2015   ALT 18 03/06/2015   AST 20 03/06/2015   NA 140 08/24/2015   K 4.6 08/24/2015   CL 99 08/24/2015   CREATININE 2.30* 08/24/2015   BUN 47* 08/24/2015   CO2 21 08/24/2015   TSH 2.050 10/11/2014    Dg Chest 2 View  09/12/2014  CLINICAL DATA:  70 year old male with cough and congestion for 8 days. Initial encounter. Personal history former smoker. EXAM: CHEST  2 VIEW COMPARISON:  None. FINDINGS: Upper limits of normal lung volumes. Normal cardiac size and mediastinal contours. Visualized tracheal air column is within normal limits. No pneumothorax, pulmonary edema, pleural effusion or confluent pulmonary opacity. No acute osseous abnormality identified. IMPRESSION: No acute cardiopulmonary abnormality. Electronically Signed   By: Genevie Ann M.D.   On: 09/12/2014 14:46   Dg Ankle Right Port  09/12/2014  CLINICAL DATA:  Right foot cellulitis initial evaluation, concern for septic arthritis right ankle joint EXAM: PORTABLE RIGHT ANKLE - 2 VIEW COMPARISON:  None. FINDINGS: No fracture or dislocation. Mild tibiotalar arthritis with small anterior osteophyte. Increased attenuation surrounding the joint suggests possibility of small to moderate joint effusion. No evidence of osteomyelitis. No evidence of erosive change involving the osseous components of the ankle joint. IMPRESSION: Probable ankle joint effusion.  No acute findings otherwise. Electronically Signed   By: Skipper Cliche M.D.   On: 09/12/2014 19:13   Dg Foot Complete Right  09/12/2014  CLINICAL DATA:  Initial evaluation for right foot cellulitis, concern for septic arthritis of the ankle  joint EXAM: RIGHT FOOT COMPLETE - 3+ VIEW COMPARISON:  None. FINDINGS: There appears to be a moderate ankle joint effusion. There is mild tibiotalar arthritis. No evidence of fracture or dislocation involving the bones of the right foot. No evidence of osteomyelitis or other acute findings. IMPRESSION: Ankle joint effusion.  No acute findings otherwise. Electronically Signed   By: Skipper Cliche M.D.   On: 09/12/2014 19:14    Assessment & Plan:   Caden was seen today for annual exam, hypertension, hyperlipidemia and gout.  Diagnoses and all orders for this visit:  Polycystic kidney disease -     CBC with Differential/Platelet -     CMP14+EGFR -     POCT urinalysis dipstick  HLD (hyperlipidemia) -     CMP14+EGFR -     Lipid panel -     Uric acid  Idiopathic chronic gout of foot without tophus, unspecified laterality -     CMP14+EGFR  Sciatica of left side  Left knee pain -     CMP14+EGFR -     DG Knee 1-2 Views Left; Future  Midline low back pain with left-sided sciatica -     DG Lumbar Spine 2-3 Views; Future  Cough -     CBC with Differential/Platelet -     CMP14+EGFR -     DG Chest 2 View; Future  Vitamin D deficiency -     Vitamin D 1,25 dihydroxy  Screening for prostate cancer -  PSA Total (Reflex To Free)      I have discontinued Michael Cervantes predniSONE. I am also having him maintain his furosemide, lisinopril, Multiple Vitamins-Minerals (ONE-A-DAY MENS HEALTH FORMULA PO), colchicine, atorvastatin, allopurinol, and calcitRIOL.  Meds ordered this encounter  Medications  . calcitRIOL (ROCALTROL) 0.25 MCG capsule    Sig: Take by mouth every other day.      Follow-up: Return in about 6 weeks (around 10/22/2015) for Pain, Arthritis knee, low back.  Claretta Fraise, M.D.

## 2015-09-10 NOTE — Addendum Note (Signed)
Addended by: Marin Olp on: 09/10/2015 10:31 AM   Modules accepted: Orders, Medications

## 2015-09-10 NOTE — Progress Notes (Signed)
Quick Note:  Your chest x-ray looked normal. Thanks, WS. ______ 

## 2015-09-12 ENCOUNTER — Other Ambulatory Visit: Payer: Self-pay | Admitting: *Deleted

## 2015-09-12 DIAGNOSIS — R195 Other fecal abnormalities: Secondary | ICD-10-CM

## 2015-09-12 LAB — FECAL OCCULT BLOOD, IMMUNOCHEMICAL: FECAL OCCULT BLD: POSITIVE — AB

## 2015-09-14 ENCOUNTER — Other Ambulatory Visit: Payer: Self-pay | Admitting: Family Medicine

## 2015-09-14 DIAGNOSIS — K625 Hemorrhage of anus and rectum: Secondary | ICD-10-CM

## 2015-09-14 LAB — CMP14+EGFR
A/G RATIO: 2.1 (ref 1.1–2.5)
ALBUMIN: 4.1 g/dL (ref 3.5–4.8)
ALT: 15 IU/L (ref 0–44)
AST: 16 IU/L (ref 0–40)
Alkaline Phosphatase: 103 IU/L (ref 39–117)
BUN / CREAT RATIO: 16 (ref 10–22)
BUN: 33 mg/dL — ABNORMAL HIGH (ref 8–27)
Bilirubin Total: 0.5 mg/dL (ref 0.0–1.2)
CALCIUM: 9.7 mg/dL (ref 8.6–10.2)
CO2: 22 mmol/L (ref 18–29)
CREATININE: 2.02 mg/dL — AB (ref 0.76–1.27)
Chloride: 100 mmol/L (ref 96–106)
GFR, EST AFRICAN AMERICAN: 38 mL/min/{1.73_m2} — AB (ref >59)
GFR, EST NON AFRICAN AMERICAN: 32 mL/min/{1.73_m2} — AB (ref >59)
GLOBULIN, TOTAL: 2 g/dL (ref 1.5–4.5)
Glucose: 89 mg/dL (ref 65–99)
POTASSIUM: 4.5 mmol/L (ref 3.5–5.2)
SODIUM: 138 mmol/L (ref 134–144)
TOTAL PROTEIN: 6.1 g/dL (ref 6.0–8.5)

## 2015-09-14 LAB — LIPID PANEL
CHOL/HDL RATIO: 4.4 ratio (ref 0.0–5.0)
Cholesterol, Total: 164 mg/dL (ref 100–199)
HDL: 37 mg/dL — ABNORMAL LOW (ref >39)
LDL Calculated: 97 mg/dL (ref 0–99)
Triglycerides: 148 mg/dL (ref 0–149)
VLDL CHOLESTEROL CAL: 30 mg/dL (ref 5–40)

## 2015-09-14 LAB — CBC WITH DIFFERENTIAL/PLATELET
BASOS: 0 %
Basophils Absolute: 0 10*3/uL (ref 0.0–0.2)
EOS (ABSOLUTE): 0.2 10*3/uL (ref 0.0–0.4)
EOS: 2 %
HEMATOCRIT: 34.8 % — AB (ref 37.5–51.0)
HEMOGLOBIN: 11.9 g/dL — AB (ref 12.6–17.7)
IMMATURE GRANS (ABS): 0 10*3/uL (ref 0.0–0.1)
IMMATURE GRANULOCYTES: 0 %
Lymphocytes Absolute: 1.8 10*3/uL (ref 0.7–3.1)
Lymphs: 17 %
MCH: 31.7 pg (ref 26.6–33.0)
MCHC: 34.2 g/dL (ref 31.5–35.7)
MCV: 93 fL (ref 79–97)
MONOCYTES: 9 %
MONOS ABS: 0.9 10*3/uL (ref 0.1–0.9)
NEUTROS PCT: 72 %
Neutrophils Absolute: 7.7 10*3/uL — ABNORMAL HIGH (ref 1.4–7.0)
Platelets: 286 10*3/uL (ref 150–379)
RBC: 3.75 x10E6/uL — AB (ref 4.14–5.80)
RDW: 14 % (ref 12.3–15.4)
WBC: 10.7 10*3/uL (ref 3.4–10.8)

## 2015-09-14 LAB — PSA TOTAL (REFLEX TO FREE): Prostate Specific Ag, Serum: 0.4 ng/mL (ref 0.0–4.0)

## 2015-09-14 LAB — URIC ACID: Uric Acid: 8 mg/dL (ref 3.7–8.6)

## 2015-09-14 LAB — VITAMIN D 1,25 DIHYDROXY
VITAMIN D 1, 25 (OH) TOTAL: 26 pg/mL
Vitamin D3 1, 25 (OH)2: 25 pg/mL

## 2015-09-19 DIAGNOSIS — S338XXA Sprain of other parts of lumbar spine and pelvis, initial encounter: Secondary | ICD-10-CM | POA: Diagnosis not present

## 2015-09-19 DIAGNOSIS — M546 Pain in thoracic spine: Secondary | ICD-10-CM | POA: Diagnosis not present

## 2015-09-19 DIAGNOSIS — M9903 Segmental and somatic dysfunction of lumbar region: Secondary | ICD-10-CM | POA: Diagnosis not present

## 2015-09-19 DIAGNOSIS — M47816 Spondylosis without myelopathy or radiculopathy, lumbar region: Secondary | ICD-10-CM | POA: Diagnosis not present

## 2015-09-19 DIAGNOSIS — M5442 Lumbago with sciatica, left side: Secondary | ICD-10-CM | POA: Diagnosis not present

## 2015-09-25 DIAGNOSIS — R195 Other fecal abnormalities: Secondary | ICD-10-CM | POA: Diagnosis not present

## 2015-10-01 ENCOUNTER — Ambulatory Visit (INDEPENDENT_AMBULATORY_CARE_PROVIDER_SITE_OTHER): Payer: Medicare Other | Admitting: Nurse Practitioner

## 2015-10-01 ENCOUNTER — Encounter: Payer: Self-pay | Admitting: Nurse Practitioner

## 2015-10-01 VITALS — BP 117/75 | HR 63 | Temp 95.9°F | Ht 70.0 in | Wt 189.4 lb

## 2015-10-01 DIAGNOSIS — M10071 Idiopathic gout, right ankle and foot: Secondary | ICD-10-CM | POA: Diagnosis not present

## 2015-10-01 DIAGNOSIS — M109 Gout, unspecified: Secondary | ICD-10-CM

## 2015-10-01 NOTE — Patient Instructions (Signed)

## 2015-10-01 NOTE — Progress Notes (Signed)
   Subjective:    Patient ID: Michael Cervantes, male    DOB: 1945/11/21, 70 y.o.   MRN: [id number]  HPI  Patient c/o pain on top of foot and pain radiates down under surface of foot- has had gout in the past in the other foot. Has taken colchicine yesterday and 1 pill today.   Review of Systems  Constitutional: Negative.   HENT: Negative.   Respiratory: Negative.   Cardiovascular: Negative.   Genitourinary: Negative.   Neurological: Negative.   Psychiatric/Behavioral: Negative.   All other systems reviewed and are negative.      Objective:   Physical Exam  Constitutional: He appears well-developed and well-nourished.  Cardiovascular: Normal rate, regular rhythm and normal heart sounds.   Pulmonary/Chest: Effort normal.  Musculoskeletal:  Pain on palpation to dorsal surface of right foot- mild edema- no erythema- cool to touch  Neurological: He is alert.  Skin: Skin is warm.  Psychiatric: He has a normal mood and affect. His behavior is normal. Judgment and thought content normal.    BP 117/75 mmHg  Pulse 63  Temp(Src) 95.9 F (35.5 C) (Oral)  Ht 5\' 10"  (1.778 m)  Wt 189 lb 6.4 oz (85.911 kg)  BMI 27.18 kg/m2      Assessment & Plan:   1. Acute gout of right foot, unspecified cause    Continue colchicine as rx Continue allopurinol Uric acid level pending  Mary-Margaret Hassell Done, FNP

## 2015-10-03 LAB — URIC ACID: URIC ACID: 7.6 mg/dL (ref 3.7–8.6)

## 2015-10-08 DIAGNOSIS — M9903 Segmental and somatic dysfunction of lumbar region: Secondary | ICD-10-CM | POA: Diagnosis not present

## 2015-10-08 DIAGNOSIS — M5442 Lumbago with sciatica, left side: Secondary | ICD-10-CM | POA: Diagnosis not present

## 2015-10-08 DIAGNOSIS — M546 Pain in thoracic spine: Secondary | ICD-10-CM | POA: Diagnosis not present

## 2015-10-08 DIAGNOSIS — S338XXA Sprain of other parts of lumbar spine and pelvis, initial encounter: Secondary | ICD-10-CM | POA: Diagnosis not present

## 2015-10-08 DIAGNOSIS — M47816 Spondylosis without myelopathy or radiculopathy, lumbar region: Secondary | ICD-10-CM | POA: Diagnosis not present

## 2015-10-22 ENCOUNTER — Ambulatory Visit (INDEPENDENT_AMBULATORY_CARE_PROVIDER_SITE_OTHER): Payer: Medicare Other | Admitting: Family Medicine

## 2015-10-22 ENCOUNTER — Encounter: Payer: Self-pay | Admitting: Family Medicine

## 2015-10-22 VITALS — BP 105/67 | HR 61 | Temp 97.6°F | Wt 191.4 lb

## 2015-10-22 DIAGNOSIS — N183 Chronic kidney disease, stage 3 unspecified: Secondary | ICD-10-CM

## 2015-10-22 DIAGNOSIS — M1A3790 Chronic gout due to renal impairment, unspecified ankle and foot, without tophus (tophi): Secondary | ICD-10-CM | POA: Diagnosis not present

## 2015-10-22 DIAGNOSIS — D631 Anemia in chronic kidney disease: Secondary | ICD-10-CM | POA: Diagnosis not present

## 2015-10-22 NOTE — Progress Notes (Signed)
Subjective:  Patient ID: Michael Cervantes, male    DOB: [birth year]  Age: 70 y.o. MRN: [id number]  CC: Gout   HPI Michael Cervantes presents for Multiple recent attacks of his gout. Primarily in the feet. Good relief for the colchicine. However it seems every time he discontinues it he has 2 start again within a few days. The allopurinol does not seem to be holding him. Of note is that his recent uric acid level was 8.0 in spite of being on allopurinol 100 mg daily at the time. Patient denies any sequela of his renal insufficiency. He says he's been fighting renal insufficiency for 16 years. He drinks a lot of water. He has not had any swelling. He has not had oliguria. He does have some morning flank pain bilaterally. He calls this a stiffness and points to the lateral lower back above the hip.   History Michael Cervantes has a past medical history of Hypertension; Arthritis; OA (osteoarthritis) of foot; Anemia; Cellulitis (08/2014); Polycystic kidney disease; and Gout.   He has past surgical history that includes Cyst excision and Tonsillectomy.   His family history includes Arthritis in his sister; Cancer in his father; Dementia in his mother.He reports that he quit smoking about 26 years ago. His smokeless tobacco use includes Snuff. He reports that he does not drink alcohol or use illicit drugs.    ROS Review of Systems  Constitutional: Negative for fever, chills, diaphoresis and unexpected weight change.  HENT: Negative for congestion, hearing loss, rhinorrhea and sore throat.   Eyes: Negative for visual disturbance.  Respiratory: Negative for cough and shortness of breath.   Cardiovascular: Negative for chest pain.  Gastrointestinal: Negative for abdominal pain, diarrhea and constipation.  Genitourinary: Negative for dysuria and flank pain.  Musculoskeletal: Negative for joint swelling and arthralgias.  Skin: Negative for rash.  Neurological: Negative for dizziness and headaches.    Psychiatric/Behavioral: Negative for sleep disturbance and dysphoric mood.    Objective:  BP 105/67 mmHg  Pulse 61  Temp(Src) 97.6 F (36.4 C) (Oral)  Wt 191 lb 6.4 oz (86.818 kg)  SpO2 98%  BP Readings from Last 3 Encounters:  10/22/15 105/67  10/01/15 117/75  09/10/15 100/62    Wt Readings from Last 3 Encounters:  10/22/15 191 lb 6.4 oz (86.818 kg)  10/01/15 189 lb 6.4 oz (85.911 kg)  09/10/15 189 lb 3.2 oz (85.821 kg)     Physical Exam  Constitutional: He is oriented to person, place, and time. He appears well-developed and well-nourished. No distress.  HENT:  Head: Normocephalic and atraumatic.  Right Ear: External ear normal.  Left Ear: External ear normal.  Nose: Nose normal.  Mouth/Throat: Oropharynx is clear and moist.  Eyes: Conjunctivae and EOM are normal. Pupils are equal, round, and reactive to light.  Neck: Normal range of motion. Neck supple. No thyromegaly present.  Cardiovascular: Normal rate, regular rhythm and normal heart sounds.   No murmur heard. Pulmonary/Chest: Effort normal and breath sounds normal. No respiratory distress. He has no wheezes. He has no rales.  Abdominal: Soft. Bowel sounds are normal. He exhibits no distension. There is no tenderness.  Lymphadenopathy:    He has no cervical adenopathy.  Neurological: He is alert and oriented to person, place, and time. He has normal reflexes.  Skin: Skin is warm and dry.  Psychiatric: He has a normal mood and affect. His behavior is normal. Judgment and thought content normal.     Lab Results  Component Value  Date   WBC 10.7 09/10/2015   HGB 11.9* 10/05/2014   HCT 34.8* 09/10/2015   PLT 286 09/10/2015   GLUCOSE 89 09/10/2015   CHOL 164 09/10/2015   TRIG 148 09/10/2015   HDL 37* 09/10/2015   LDLCALC 97 09/10/2015   ALT 15 09/10/2015   AST 16 09/10/2015   NA 138 09/10/2015   K 4.5 09/10/2015   CL 100 09/10/2015   CREATININE 2.02* 09/10/2015   BUN 33* 09/10/2015   CO2 22  09/10/2015   TSH 2.050 10/11/2014    Dg Chest 2 View  09/12/2014  CLINICAL DATA:  70 year old male with cough and congestion for 8 days. Initial encounter. Personal history former smoker. EXAM: CHEST  2 VIEW COMPARISON:  None. FINDINGS: Upper limits of normal lung volumes. Normal cardiac size and mediastinal contours. Visualized tracheal air column is within normal limits. No pneumothorax, pulmonary edema, pleural effusion or confluent pulmonary opacity. No acute osseous abnormality identified. IMPRESSION: No acute cardiopulmonary abnormality. Electronically Signed   By: Genevie Ann M.D.   On: 09/12/2014 14:46   Dg Ankle Right Port  09/12/2014  CLINICAL DATA:  Right foot cellulitis initial evaluation, concern for septic arthritis right ankle joint EXAM: PORTABLE RIGHT ANKLE - 2 VIEW COMPARISON:  None. FINDINGS: No fracture or dislocation. Mild tibiotalar arthritis with small anterior osteophyte. Increased attenuation surrounding the joint suggests possibility of small to moderate joint effusion. No evidence of osteomyelitis. No evidence of erosive change involving the osseous components of the ankle joint. IMPRESSION: Probable ankle joint effusion.  No acute findings otherwise. Electronically Signed   By: Skipper Cliche M.D.   On: 09/12/2014 19:13   Dg Foot Complete Right  09/12/2014  CLINICAL DATA:  Initial evaluation for right foot cellulitis, concern for septic arthritis of the ankle joint EXAM: RIGHT FOOT COMPLETE - 3+ VIEW COMPARISON:  None. FINDINGS: There appears to be a moderate ankle joint effusion. There is mild tibiotalar arthritis. No evidence of fracture or dislocation involving the bones of the right foot. No evidence of osteomyelitis or other acute findings. IMPRESSION: Ankle joint effusion.  No acute findings otherwise. Electronically Signed   By: Skipper Cliche M.D.   On: 09/12/2014 19:14    Assessment & Plan:   Michael Cervantes was seen today for gout.  Diagnoses and all orders for this  visit:  Chronic gout due to renal impairment involving foot without tophus, unspecified laterality -     Uric acid -     BMP8+EGFR -     CBC with Differential/Platelet  Chronic kidney disease (CKD), stage III (moderate) -     Uric acid -     BMP8+EGFR -     CBC with Differential/Platelet  Anemia associated with chronic renal failure, stage 3 (moderate) -     Uric acid -     BMP8+EGFR -     CBC with Differential/Platelet   His stiffness I think is related more to muscle and joint than it is to the gout. Think we may need to switch him over to [facility] for his preventative gout treatment. Continue the colchicine as needed.  I am having Mr. Perret maintain his Multiple Vitamins-Minerals (ONE-A-DAY MENS HEALTH FORMULA PO), colchicine, atorvastatin, calcitRIOL, allopurinol, furosemide, and lisinopril.  No orders of the defined types were placed in this encounter.     Follow-up: Return in about 6 months (around 04/23/2016) for CPE.  Claretta Fraise, M.D.

## 2015-10-23 ENCOUNTER — Other Ambulatory Visit: Payer: Self-pay | Admitting: Family Medicine

## 2015-10-23 LAB — BMP8+EGFR
BUN / CREAT RATIO: 15 (ref 10–22)
BUN: 30 mg/dL — AB (ref 8–27)
CALCIUM: 9.7 mg/dL (ref 8.6–10.2)
CO2: 24 mmol/L (ref 18–29)
CREATININE: 1.97 mg/dL — AB (ref 0.76–1.27)
Chloride: 103 mmol/L (ref 96–106)
GFR, EST AFRICAN AMERICAN: 39 mL/min/{1.73_m2} — AB (ref >59)
GFR, EST NON AFRICAN AMERICAN: 33 mL/min/{1.73_m2} — AB (ref >59)
Glucose: 91 mg/dL (ref 65–99)
Potassium: 4.5 mmol/L (ref 3.5–5.2)
Sodium: 143 mmol/L (ref 134–144)

## 2015-10-23 LAB — CBC WITH DIFFERENTIAL/PLATELET
BASOS ABS: 0 10*3/uL (ref 0.0–0.2)
BASOS: 0 %
EOS (ABSOLUTE): 0.3 10*3/uL (ref 0.0–0.4)
EOS: 4 %
HEMATOCRIT: 34.2 % — AB (ref 37.5–51.0)
HEMOGLOBIN: 11.7 g/dL — AB (ref 12.6–17.7)
IMMATURE GRANS (ABS): 0 10*3/uL (ref 0.0–0.1)
Immature Granulocytes: 0 %
LYMPHS ABS: 1.6 10*3/uL (ref 0.7–3.1)
LYMPHS: 22 %
MCH: 32.3 pg (ref 26.6–33.0)
MCHC: 34.2 g/dL (ref 31.5–35.7)
MCV: 95 fL (ref 79–97)
MONOCYTES: 10 %
Monocytes Absolute: 0.7 10*3/uL (ref 0.1–0.9)
NEUTROS ABS: 4.7 10*3/uL (ref 1.4–7.0)
Neutrophils: 64 %
Platelets: 231 10*3/uL (ref 150–379)
RBC: 3.62 x10E6/uL — ABNORMAL LOW (ref 4.14–5.80)
RDW: 14.4 % (ref 12.3–15.4)
WBC: 7.4 10*3/uL (ref 3.4–10.8)

## 2015-10-23 LAB — URIC ACID: Uric Acid: 7.8 mg/dL (ref 3.7–8.6)

## 2015-10-23 MED ORDER — ALLOPURINOL 100 MG PO TABS: 200.0000 mg | ORAL_TABLET | Freq: Every day | ORAL | Status: DC

## 2015-10-25 ENCOUNTER — Encounter: Payer: Self-pay | Admitting: *Deleted

## 2015-10-25 DIAGNOSIS — R195 Other fecal abnormalities: Secondary | ICD-10-CM | POA: Diagnosis not present

## 2015-11-28 ENCOUNTER — Other Ambulatory Visit: Payer: Self-pay | Admitting: *Deleted

## 2015-11-28 MED ORDER — ATORVASTATIN CALCIUM 40 MG PO TABS: 40.0000 mg | ORAL_TABLET | Freq: Every day | ORAL | Status: DC

## 2015-11-28 NOTE — Telephone Encounter (Signed)
Patient requested that atorvastatin be sent to [facility].

## 2016-01-16 ENCOUNTER — Other Ambulatory Visit: Payer: Self-pay | Admitting: Family Medicine

## 2016-02-21 DIAGNOSIS — M899 Disorder of bone, unspecified: Secondary | ICD-10-CM | POA: Diagnosis not present

## 2016-02-21 DIAGNOSIS — Z79899 Other long term (current) drug therapy: Secondary | ICD-10-CM | POA: Diagnosis not present

## 2016-02-21 DIAGNOSIS — Q612 Polycystic kidney, adult type: Secondary | ICD-10-CM | POA: Diagnosis not present

## 2016-02-21 DIAGNOSIS — E785 Hyperlipidemia, unspecified: Secondary | ICD-10-CM | POA: Diagnosis not present

## 2016-02-21 DIAGNOSIS — Z881 Allergy status to other antibiotic agents status: Secondary | ICD-10-CM | POA: Diagnosis not present

## 2016-02-21 DIAGNOSIS — I129 Hypertensive chronic kidney disease with stage 1 through stage 4 chronic kidney disease, or unspecified chronic kidney disease: Secondary | ICD-10-CM | POA: Diagnosis not present

## 2016-02-21 DIAGNOSIS — D631 Anemia in chronic kidney disease: Secondary | ICD-10-CM | POA: Diagnosis not present

## 2016-02-21 DIAGNOSIS — N183 Chronic kidney disease, stage 3 (moderate): Secondary | ICD-10-CM | POA: Diagnosis not present

## 2016-03-14 ENCOUNTER — Other Ambulatory Visit: Payer: Self-pay | Admitting: Family Medicine

## 2016-03-18 ENCOUNTER — Ambulatory Visit (INDEPENDENT_AMBULATORY_CARE_PROVIDER_SITE_OTHER): Payer: Medicare Other | Admitting: Pharmacist

## 2016-03-18 ENCOUNTER — Encounter: Payer: Self-pay | Admitting: Pharmacist

## 2016-03-18 VITALS — BP 118/72 | HR 62 | Ht 70.25 in | Wt 180.0 lb

## 2016-03-18 DIAGNOSIS — Z0000 Encounter for general adult medical examination without abnormal findings: Secondary | ICD-10-CM

## 2016-03-18 NOTE — Progress Notes (Signed)
Patient ID: Michael Cervantes, male   DOB: 1945/08/29, 70 y.o.   MRN: [id number]    Subjective:   Michael Cervantes is a 70 y.o. male who presents for a SUBSEQUENT Medicare Annual Wellness Visit.  Michael Cervantes is married and lives with his wife in [place], [place].  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 He has two children  Review of Systems  Review of Systems  Constitutional: Negative.   HENT: Positive for hearing loss (he has had hearing checked in past but did want to spend money for hearing aid.  He has recieved come communication from [facility] about programs to help with cost of hearing devices).   Eyes: Negative.   Respiratory: Negative.   Cardiovascular: Negative.   Gastrointestinal: Positive for heartburn (relieved with tums).  Genitourinary: Negative.   Musculoskeletal: Positive for joint pain (related to gout).  Skin: Negative.   Neurological: Positive for tingling (continued tingling in toes - patient has discussed with PCP in previous visits. ).  Endo/Heme/Allergies: Negative.   Psychiatric/Behavioral: Negative.     Current Medications (verified) Outpatient Encounter Prescriptions as of 03/18/2016  Medication Sig  . allopurinol (ZYLOPRIM) 100 MG tablet Take 2 tablets (200 mg total) by mouth daily.  Marland Kitchen atorvastatin (LIPITOR) 40 MG tablet TAKE 1 TABLET (40 MG TOTAL) BY MOUTH DAILY. FOR CHOLESTEROL  . colchicine 0.6 MG tablet TAKE 2 TABS BY MOUTH NOW THEN 1 AN HOUR LATER, THEN 1 TWICE A DAY UNTIL COMPLETE RELIEF THEN 1 DAILY  . furosemide (LASIX) 40 MG tablet Take 1 tablet (40 mg total) by mouth daily.  Marland Kitchen lisinopril (PRINIVIL,ZESTRIL) 10 MG tablet Take 1 tablet (10 mg total) by mouth daily.  . Multiple Vitamins-Minerals (ONE-A-DAY MENS HEALTH FORMULA PO) Take 1 tablet by mouth daily.  . [DISCONTINUED] atorvastatin (LIPITOR) 40 MG tablet Take 1 tablet by mouth  daily for cholesterol  . [DISCONTINUED] calcitRIOL  (ROCALTROL) 0.25 MCG capsule Take by mouth every other day.    No facility-administered encounter medications on file as of 03/18/2016.     Allergies (verified) Cefaclor   History: Past Medical History:  Diagnosis Date  . Anemia   . Arthritis   . Bulging lumbar disc   . Cellulitis 08/2014   rt foot  . Gout   . Hyperlipidemia   . OA (osteoarthritis) of foot    rt foot  . Polycystic kidney disease   . Sciatica 2016   Past Surgical History:  Procedure Laterality Date  . CYST EXCISION     tail bone  . TONSILLECTOMY     Family History  Problem Relation Age of Onset  . Dementia Mother   . Alzheimer's disease Mother   . Cancer Father     lymphoma met to brain  . Arthritis Sister    Social History   Occupational History  . Not on file.   Social History Main Topics  . Smoking status: Former Smoker    Packs/day: 1.00    Years: 35.00    Quit date: 07/28/1989  . Smokeless tobacco: Current User    Types: Snuff  . Alcohol use No  . Drug use: No  . Sexual activity: Yes    Do you feel safe at home?  Yes Are there smokers in your home (other than you)? No  Dietary issues and exercise activities discussed: Current Exercise Habits: Home exercise routine, Type of exercise: walking, Time (Minutes): 15,  Frequency (Times/Week): 7, Weekly Exercise (Minutes/Week): 105, Intensity: Moderate  Walks 1/2 mile every day  Current Dietary habits:  Eats lots of vegetables from his garden.  Chooses lean proteins most of the time.     Cardiac Risk Factors include: advanced age (>96mn, >>32women);dyslipidemia;male gender;smoking/ tobacco exposure  Objective:    Today's Vitals   03/18/16 1044  BP: 118/72  Pulse: 62  Weight: 180 lb (81.6 kg)  Height: 5' 10.25" (1.784 m)  PainSc: 0-No pain   Body mass index is 25.64 kg/m.   Weight has decreased about 10 lbs over last 6 months   Activities of Daily Living In your present state of health, do you have any difficulty performing  the following activities: 03/18/2016  Hearing? Y  Vision? N  Difficulty concentrating or making decisions? N  Walking or climbing stairs? N  Dressing or bathing? N  Doing errands, shopping? N  Preparing Food and eating ? N  Using the Toilet? N  In the past six months, have you accidently leaked urine? N  Do you have problems with loss of bowel control? N  Managing your Medications? N  Managing your Finances? N  Housekeeping or managing your Housekeeping? N  Some recent data might be hidden     Depression Screen PHQ 2/9 Scores 03/18/2016 10/22/2015 09/10/2015 07/27/2015  PHQ - 2 Score 0 0 0 0     Fall Risk Fall Risk  03/18/2016 10/22/2015 09/10/2015 07/27/2015 03/06/2015  Falls in the past year? _0     Cognitive Function: MMSE - Mini Mental State Exam 03/18/2016 11/22/2014  Orientation to time 5 5  Orientation to Place 5 5  Registration 3 3  Attention/ Calculation 5 5  Recall 3 3  Language- name 2 objects 2 2  Language- repeat 1 1  Language- follow 3 step command 3 3  Language- read & follow direction 1 1  Write a sentence 1 1  Copy design 1 1  Total score 30 30    Immunizations and Health Maintenance Immunization History  Administered Date(s) Administered  . Influenza,inj,Quad PF,36+ Mos 06/18/2015  . Influenza-Unspecified 06/12/2014  . Pneumococcal Conjugate-13 10/19/2014  . Pneumococcal Polysaccharide-23 07/28/2005  . Tdap 12/13/2004  . Zoster 06/18/2015   Health Maintenance Due  Topic Date Due  . Hepatitis C Screening  11946/06/30 . PNA vac Low Risk Adult (2 of 2 - PPSV23) 10/19/2015  . INFLUENZA VACCINE  02/26/2016    Patient Care Team: WClaretta Fraise MD as PCP - General (Family Medicine) MPrecious Gilding MD as Referring Physician (Nephrology)   DRennie Natter[facility]- [facility] ([place])  Indicate any recent Medical Services you may have received from other than [facility] providers in the past year (date  may be approximate).    Assessment:    Annual Wellness Visit Overweight - weight is trending down  Tingling in toes Decreased hearing   Screening Tests Health Maintenance  Topic Date Due  . Hepatitis C Screening  [birth year] . PNA vac Low Risk Adult (2 of 2 - PPSV23) 10/19/2015  . INFLUENZA VACCINE  02/26/2016  . TETANUS/TDAP  10/10/2021  . COLONOSCOPY  10/24/2025  . ZOSTAVAX  Completed        Plan:   During the course of the visit SPernellwas educated and counseled about the following appropriate screening and preventive services:   Vaccines to include Pneumoccal, Influenza,  Td, Zostavax - all vaccines are UTD  Colorectal cancer screening - colonscopy and FOBT are both UTD  Cardiovascular disease screening - last EKG 12/2014  Lipids and BP at goals  Diabetes screening - last FBG was 91  Glaucoma screening / Eye Exam - has appt 03/24/2016  Nutrition counseling - discussed continue to eat lots of vegetables and fruits.  Limit red meat and high fat foods.  Increase whole grains.  Continue with decrease in weight - goal BMI is 23 to 25  Prostate cancer screening - UTD  Advanced Directives - UTD  Physical Activity - continue to stay active and exercise daily  Recommended trial of B Complex for toe numbness as patient does not want to try gabapentin or other prescription products discussed at previous visits.  Continue to follow up with PCP regarding this complaint- appt 03/2016 with Dr Livia Snellen.  Will check hep C with next labs  In 1 month.  Advised patient to follow up with Dr Joseph Berkshire regarding labs from visit with Dr Joseph Berkshire 01/2016  Advised patient to look into assistance with hearing devices from his insurance [employer].      Patient Instructions (the written plan) were given to the patient.   Cherre Robins, PharmD   03/18/2016

## 2016-03-18 NOTE — Patient Instructions (Addendum)
[name] , Thank you for taking time to come for your Medicare Wellness Visit. I appreciate your ongoing commitment to your health goals. Please review the following plan we discussed and let me know if I can assist you in the future.   These are the goals we discussed:  Try B Complex Vitamin for numbness in toes Check with [facility] about hearing aid assistance Call Dr Joseph Berkshire about last labs results - [phone]  Continue to exercise daily and eat lots of fruits and vegetables, whole grains and lean proteins.  Limit intake of sugar and processed foods  This is a list of the screening recommended for you and due dates:  Health Maintenance  Topic Date Due  .  Hepatitis C: One time screening is recommended by Center for Disease Control  (CDC) for  adults born from [birth year] through 1966.   07-Dec-1945  . Pneumonia vaccines (2 of 2 - PPSV23) completed  . Flu Shot  02/26/2016  . Tetanus Vaccine  10/10/2021  . Colon Cancer Screening  10/24/2025  . Shingles Vaccine  Completed   Health Maintenance, Male A healthy lifestyle and preventative care can promote health and wellness.  Maintain regular health, dental, and eye exams.  Eat a healthy diet. Foods like vegetables, fruits, whole grains, low-fat dairy products, and lean protein foods contain the nutrients you need and are low in calories. Decrease your intake of foods high in solid fats, added sugars, and salt. Get information about a proper diet from your health care provider, if necessary.  Regular physical exercise is one of the most important things you can do for your health. Most adults should get at least 150 minutes of moderate-intensity exercise (any activity that increases your heart rate and causes you to sweat) each week. In addition, most adults need muscle-strengthening exercises on 2 or more days a week.   Maintain a healthy weight. The body mass index (BMI) is a screening tool to identify possible weight problems. It  provides an estimate of body fat based on height and weight. Your health care provider can find your BMI and can help you achieve or maintain a healthy weight. For males 20 years and older:  A BMI below 18.5 is considered underweight.  A BMI of 18.5 to 24.9 is normal.  A BMI of 25 to 29.9 is considered overweight.  A BMI of 30 and above is considered obese.  Maintain normal blood lipids and cholesterol by exercising and minimizing your intake of saturated fat. Eat a balanced diet with plenty of fruits and vegetables. Blood tests for lipids and cholesterol should begin at age 49 and be repeated every 5 years. If your lipid or cholesterol levels are high, you are over age 32, or you are at high risk for heart disease, you may need your cholesterol levels checked more frequently.Ongoing high lipid and cholesterol levels should be treated with medicines if diet and exercise are not working.  If you smoke, find out from your health care provider how to quit. If you do not use tobacco, do not start.  Lung cancer screening is recommended for adults aged 27-80 years who are at high risk for developing lung cancer because of a history of smoking. A yearly low-dose CT scan of the lungs is recommended for people who have at least a 30-pack-year history of smoking and are current smokers or have quit within the past 15 years. A pack year of smoking is smoking an average of 1 pack  of cigarettes a day for 1 year (for example, a 30-pack-year history of smoking could mean smoking 1 pack a day for 30 years or 2 packs a day for 15 years). Yearly screening should continue until the smoker has stopped smoking for at least 15 years. Yearly screening should be stopped for people who develop a health problem that would prevent them from having lung cancer treatment.  If you choose to drink alcohol, do not have more than 2 drinks per day. One drink is considered to be 12 oz (360 mL) of beer, 5 oz (150 mL) of wine, or 1.5  oz (45 mL) of liquor.  Avoid the use of street drugs. Do not share needles with anyone. Ask for help if you need support or instructions about stopping the use of drugs.  High blood pressure causes heart disease and increases the risk of stroke. High blood pressure is more likely to develop in:  People who have blood pressure in the end of the normal range (100-139/85-89 mm Hg).  People who are overweight or obese.  People who are African American.  If you are 56-1 years of age, have your blood pressure checked every 3-5 years. If you are 61 years of age or older, have your blood pressure checked every year. You should have your blood pressure measured twice--once when you are at a hospital or clinic, and once when you are not at a hospital or clinic. Record the average of the two measurements. To check your blood pressure when you are not at a hospital or clinic, you can use:  An automated blood pressure machine at a pharmacy.  A home blood pressure monitor.  If you are 38-9 years old, ask your health care provider if you should take aspirin to prevent heart disease.  Diabetes screening involves taking a blood sample to check your fasting blood sugar level. This should be done once every 3 years after age 27 if you are at a normal weight and without risk factors for diabetes. Testing should be considered at a younger age or be carried out more frequently if you are overweight and have at least 1 risk factor for diabetes.  Colorectal cancer can be detected and often prevented. Most routine colorectal cancer screening begins at the age of 76 and continues through age 65. However, your health care provider may recommend screening at an earlier age if you have risk factors for colon cancer. On a yearly basis, your health care provider may provide home test kits to check for hidden blood in the stool. A small camera at the end of a tube may be used to directly examine the colon (sigmoidoscopy or  colonoscopy) to detect the earliest forms of colorectal cancer. Talk to your health care provider about this at age 38 when routine screening begins. A direct exam of the colon should be repeated every 5-10 years through age 94, unless early forms of precancerous polyps or small growths are found.  People who are at an increased risk for hepatitis B should be screened for this virus. You are considered at high risk for hepatitis B if:  You were born in a country where hepatitis B occurs often. Talk with your health care provider about which countries are considered high risk.  Your parents were born in a high-risk country and you have not received a shot to protect against hepatitis B (hepatitis B vaccine).  You have HIV or AIDS.  You use needles to inject street drugs.  You live with, or have sex with, someone who has hepatitis B.  You are a man who has sex with other men (MSM).  You get hemodialysis treatment.  You take certain medicines for conditions like cancer, organ transplantation, and autoimmune conditions.  Hepatitis C blood testing is recommended for all people born from [birth year] through 1965 and any individual with known risk factors for hepatitis C.  Healthy men should no longer receive prostate-specific antigen (PSA) blood tests as part of routine cancer screening. Talk to your health care provider about prostate cancer screening.  Testicular cancer screening is not recommended for adolescents or adult males who have no symptoms. Screening includes self-exam, a health care provider exam, and other screening tests. Consult with your health care provider about any symptoms you have or any concerns you have about testicular cancer.  Practice safe sex. Use condoms and avoid high-risk sexual practices to reduce the spread of sexually transmitted infections (STIs).  You should be screened for STIs, including gonorrhea and chlamydia if:  You are sexually active and are younger than  24 years.  You are older than 24 years, and your health care provider tells you that you are at risk for this type of infection.  Your sexual activity has changed since you were last screened, and you are at an increased risk for chlamydia or gonorrhea. Ask your health care provider if you are at risk.  If you are at risk of being infected with HIV, it is recommended that you take a prescription medicine daily to prevent HIV infection. This is called pre-exposure prophylaxis (PrEP). You are considered at risk if:  You are a man who has sex with other men (MSM).  You are a heterosexual man who is sexually active with multiple partners.  You take drugs by injection.  You are sexually active with a partner who has HIV.  Talk with your health care provider about whether you are at high risk of being infected with HIV. If you choose to begin PrEP, you should first be tested for HIV. You should then be tested every 3 months for as long as you are taking PrEP.  Use sunscreen. Apply sunscreen liberally and repeatedly throughout the day. You should seek shade when your shadow is shorter than you. Protect yourself by wearing long sleeves, pants, a wide-brimmed hat, and sunglasses year round whenever you are outdoors.  Tell your health care provider of new moles or changes in moles, especially if there is a change in shape or color. Also, tell your health care provider if a mole is larger than the size of a pencil eraser.  A one-time screening for abdominal aortic aneurysm (AAA) and surgical repair of large AAAs by ultrasound is recommended for men aged 73-75 years who are current or former smokers.  Stay current with your vaccines (immunizations).   This information is not intended to replace advice given to you by your health care provider. Make sure you discuss any questions you have with your health care provider.   Document Released: 01/10/2008 Document Revised: 08/04/2014 Document Reviewed:  12/09/2010 Elsevier Interactive Patient Education [employer].

## 2016-03-26 DIAGNOSIS — H40033 Anatomical narrow angle, bilateral: Secondary | ICD-10-CM | POA: Diagnosis not present

## 2016-03-26 DIAGNOSIS — H2513 Age-related nuclear cataract, bilateral: Secondary | ICD-10-CM | POA: Diagnosis not present

## 2016-04-23 ENCOUNTER — Encounter: Payer: Self-pay | Admitting: Family Medicine

## 2016-04-23 ENCOUNTER — Ambulatory Visit (INDEPENDENT_AMBULATORY_CARE_PROVIDER_SITE_OTHER): Payer: Medicare Other | Admitting: Family Medicine

## 2016-04-23 VITALS — BP 107/59 | HR 62 | Temp 97.5°F | Ht 70.25 in | Wt 182.4 lb

## 2016-04-23 DIAGNOSIS — E785 Hyperlipidemia, unspecified: Secondary | ICD-10-CM

## 2016-04-23 DIAGNOSIS — Q613 Polycystic kidney, unspecified: Secondary | ICD-10-CM | POA: Diagnosis not present

## 2016-04-23 DIAGNOSIS — M1A3790 Chronic gout due to renal impairment, unspecified ankle and foot, without tophus (tophi): Secondary | ICD-10-CM

## 2016-04-23 DIAGNOSIS — Z1159 Encounter for screening for other viral diseases: Secondary | ICD-10-CM | POA: Diagnosis not present

## 2016-04-23 DIAGNOSIS — R202 Paresthesia of skin: Secondary | ICD-10-CM | POA: Diagnosis not present

## 2016-04-23 DIAGNOSIS — Z23 Encounter for immunization: Secondary | ICD-10-CM

## 2016-04-23 DIAGNOSIS — N183 Chronic kidney disease, stage 3 unspecified: Secondary | ICD-10-CM

## 2016-04-23 NOTE — Progress Notes (Signed)
Subjective:  Patient ID: Michael Cervantes, male    DOB: 1946/03/30  Age: 70 y.o. MRN: [id number]  CC: Hyperlipidemia (pt here today for routine follow up and has concerns about his toes being numb, burning and tingling especially at night.)   HPI [name] presents for Patient in for follow-up of elevated cholesterol. Doing well without complaints on current medication. Denies side effects of statin including myalgia and arthralgia and nausea. Also in today for liver function testing. Currently no chest pain, shortness of breath or other cardiovascular related symptoms noted.  There is a mild tingling to burning sensation but a sense of the toes not actually being there at times. This is mild to moderate he has had testing done before. It has progressed somewhat since last evaluation. It has not spread, it has become somewhat more intense.  History Michael Cervantes has a past medical history of Anemia; Arthritis; Bulging lumbar disc; Cellulitis (08/2014); Gout; Hyperlipidemia; OA (osteoarthritis) of foot; Polycystic kidney disease; and Sciatica (2016).   He has a past surgical history that includes Cyst excision and Tonsillectomy.   His family history includes Alzheimer's disease in his mother; Arthritis in his sister; Cancer in his father; Dementia in his mother.He reports that he quit smoking about 26 years ago. He has a 35.00 pack-year smoking history. His smokeless tobacco use includes Snuff. He reports that he does not drink alcohol or use drugs.    ROS Review of Systems  Constitutional: Negative for chills, diaphoresis, fever and unexpected weight change.  HENT: Negative for congestion, hearing loss, rhinorrhea and sore throat.   Eyes: Negative for visual disturbance.  Respiratory: Negative for cough and shortness of breath.   Cardiovascular: Negative for chest pain.  Gastrointestinal: Negative for abdominal pain, constipation and diarrhea.  Genitourinary: Negative for dysuria and flank  pain.  Musculoskeletal: Negative for arthralgias and joint swelling.  Skin: Negative for rash.  Neurological: Negative for dizziness and headaches.  Psychiatric/Behavioral: Negative for dysphoric mood and sleep disturbance.    Objective:  BP (!) 107/59   Pulse 62   Temp 97.5 F (36.4 C) (Oral)   Ht 5' 10.25" (1.784 m)   Wt 182 lb 6 oz (82.7 kg)   BMI 25.98 kg/m   BP Readings from Last 3 Encounters:  04/23/16 (!) 107/59  03/18/16 118/72  10/22/15 105/67    Wt Readings from Last 3 Encounters:  04/23/16 182 lb 6 oz (82.7 kg)  03/18/16 180 lb (81.6 kg)  10/22/15 191 lb 6.4 oz (86.8 kg)     Physical Exam  Constitutional: He is oriented to person, place, and time. He appears well-developed and well-nourished. No distress.  HENT:  Head: Normocephalic and atraumatic.  Right Ear: External ear normal.  Left Ear: External ear normal.  Nose: Nose normal.  Mouth/Throat: Oropharynx is clear and moist.  Eyes: Conjunctivae and EOM are normal. Pupils are equal, round, and reactive to light.  Neck: Normal range of motion. Neck supple. No thyromegaly present.  Cardiovascular: Normal rate, regular rhythm and normal heart sounds.   No murmur heard. Pulmonary/Chest: Effort normal and breath sounds normal. No respiratory distress. He has no wheezes. He has no rales.  Abdominal: Soft. Bowel sounds are normal. He exhibits no distension. There is no tenderness.  Lymphadenopathy:    He has no cervical adenopathy.  Neurological: He is alert and oriented to person, place, and time. He has normal reflexes.  Skin: Skin is warm and dry.  Psychiatric: He has a normal mood  and affect. His behavior is normal. Judgment and thought content normal.     Lab Results  Component Value Date   WBC 8.3 04/23/2016   HGB 11.9 (A) 10/05/2014   HCT 34.8 (L) 04/23/2016   PLT 259 04/23/2016   GLUCOSE 91 04/23/2016   CHOL 150 04/23/2016   TRIG 139 04/23/2016   HDL 42 04/23/2016   LDLCALC 80 04/23/2016    ALT 17 04/23/2016   AST 19 04/23/2016   NA 141 04/23/2016   K 4.8 04/23/2016   CL 105 04/23/2016   CREATININE 2.01 (H) 04/23/2016   BUN 33 (H) 04/23/2016   CO2 22 04/23/2016   TSH 2.050 10/11/2014    Dg Chest 2 View  Result Date: 09/12/2014 CLINICAL DATA:  70 year old male with cough and congestion for 8 days. Initial encounter. Personal history former smoker. EXAM: CHEST  2 VIEW COMPARISON:  None. FINDINGS: Upper limits of normal lung volumes. Normal cardiac size and mediastinal contours. Visualized tracheal air column is within normal limits. No pneumothorax, pulmonary edema, pleural effusion or confluent pulmonary opacity. No acute osseous abnormality identified. IMPRESSION: No acute cardiopulmonary abnormality. Electronically Signed   By: Genevie Ann M.D.   On: 09/12/2014 14:46   Dg Ankle Right Port  Result Date: 09/12/2014 CLINICAL DATA:  Right foot cellulitis initial evaluation, concern for septic arthritis right ankle joint EXAM: PORTABLE RIGHT ANKLE - 2 VIEW COMPARISON:  None. FINDINGS: No fracture or dislocation. Mild tibiotalar arthritis with small anterior osteophyte. Increased attenuation surrounding the joint suggests possibility of small to moderate joint effusion. No evidence of osteomyelitis. No evidence of erosive change involving the osseous components of the ankle joint. IMPRESSION: Probable ankle joint effusion.  No acute findings otherwise. Electronically Signed   By: Skipper Cliche M.D.   On: 09/12/2014 19:13   Dg Foot Complete Right  Result Date: 09/12/2014 CLINICAL DATA:  Initial evaluation for right foot cellulitis, concern for septic arthritis of the ankle joint EXAM: RIGHT FOOT COMPLETE - 3+ VIEW COMPARISON:  None. FINDINGS: There appears to be a moderate ankle joint effusion. There is mild tibiotalar arthritis. No evidence of fracture or dislocation involving the bones of the right foot. No evidence of osteomyelitis or other acute findings. IMPRESSION: Ankle joint  effusion.  No acute findings otherwise. Electronically Signed   By: Skipper Cliche M.D.   On: 09/12/2014 19:14    Assessment & Plan:   [name] was seen today for hyperlipidemia.  Diagnoses and all orders for this visit:  Chronic gout due to renal impairment involving foot without tophus, unspecified laterality -     CBC with Differential/Platelet; Standing -     CMP14+EGFR; Standing -     Uric acid; Standing -     CBC with Differential/Platelet -     CMP14+EGFR -     Uric acid -     CBC with Differential/Platelet -     CMP14+EGFR  Chronic kidney disease (CKD), stage III (moderate) -     CBC with Differential/Platelet; Standing -     CMP14+EGFR; Standing -     CBC with Differential/Platelet -     CMP14+EGFR -     CBC with Differential/Platelet -     CMP14+EGFR  HLD (hyperlipidemia) -     CMP14+EGFR; Standing -     Lipid panel; Standing -     CMP14+EGFR -     Lipid panel -     CBC with Differential/Platelet -     CMP14+EGFR -  Lipid panel  Polycystic kidney disease -     CBC with Differential/Platelet -     CMP14+EGFR  Paresthesia of both feet -     Vitamin B12 -     CBC with Differential/Platelet -     CMP14+EGFR  Need for hepatitis C screening test -     Hepatitis C antibody -     CBC with Differential/Platelet  Encounter for immunization -     Flu vaccine HIGH DOSE PF     I am having Mr. Mcmahan maintain his Multiple Vitamins-Minerals (ONE-A-DAY MENS HEALTH FORMULA PO), furosemide, lisinopril, allopurinol, colchicine, and atorvastatin.  No orders of the defined types were placed in this encounter.    Follow-up: Return in about 6 months (around 10/21/2016).  Claretta Fraise, M.D.

## 2016-04-24 LAB — CMP14+EGFR
ALT: 17 IU/L (ref 0–44)
AST: 19 IU/L (ref 0–40)
Albumin/Globulin Ratio: 2 (ref 1.2–2.2)
Albumin: 4.4 g/dL (ref 3.5–4.8)
Alkaline Phosphatase: 95 IU/L (ref 39–117)
BILIRUBIN TOTAL: 0.5 mg/dL (ref 0.0–1.2)
BUN/Creatinine Ratio: 16 (ref 10–24)
BUN: 33 mg/dL — AB (ref 8–27)
CALCIUM: 10.1 mg/dL (ref 8.6–10.2)
CHLORIDE: 105 mmol/L (ref 96–106)
CO2: 22 mmol/L (ref 18–29)
Creatinine, Ser: 2.01 mg/dL — ABNORMAL HIGH (ref 0.76–1.27)
GFR, EST AFRICAN AMERICAN: 38 mL/min/{1.73_m2} — AB (ref >59)
GFR, EST NON AFRICAN AMERICAN: 33 mL/min/{1.73_m2} — AB (ref >59)
GLUCOSE: 91 mg/dL (ref 65–99)
Globulin, Total: 2.2 g/dL (ref 1.5–4.5)
Potassium: 4.8 mmol/L (ref 3.5–5.2)
Sodium: 141 mmol/L (ref 134–144)
TOTAL PROTEIN: 6.6 g/dL (ref 6.0–8.5)

## 2016-04-24 LAB — CBC WITH DIFFERENTIAL/PLATELET
BASOS ABS: 0 10*3/uL (ref 0.0–0.2)
Basos: 0 %
EOS (ABSOLUTE): 0.4 10*3/uL (ref 0.0–0.4)
Eos: 5 %
Hematocrit: 34.8 % — ABNORMAL LOW (ref 37.5–51.0)
Hemoglobin: 11.7 g/dL — ABNORMAL LOW (ref 12.6–17.7)
IMMATURE GRANS (ABS): 0 10*3/uL (ref 0.0–0.1)
IMMATURE GRANULOCYTES: 0 %
LYMPHS: 22 %
Lymphocytes Absolute: 1.9 10*3/uL (ref 0.7–3.1)
MCH: 32.7 pg (ref 26.6–33.0)
MCHC: 33.6 g/dL (ref 31.5–35.7)
MCV: 97 fL (ref 79–97)
Monocytes Absolute: 0.8 10*3/uL (ref 0.1–0.9)
Monocytes: 9 %
NEUTROS PCT: 64 %
Neutrophils Absolute: 5.3 10*3/uL (ref 1.4–7.0)
PLATELETS: 259 10*3/uL (ref 150–379)
RBC: 3.58 x10E6/uL — ABNORMAL LOW (ref 4.14–5.80)
RDW: 13.4 % (ref 12.3–15.4)
WBC: 8.3 10*3/uL (ref 3.4–10.8)

## 2016-04-24 LAB — LIPID PANEL
Chol/HDL Ratio: 3.6 ratio units (ref 0.0–5.0)
Cholesterol, Total: 150 mg/dL (ref 100–199)
HDL: 42 mg/dL (ref >39)
LDL Calculated: 80 mg/dL (ref 0–99)
Triglycerides: 139 mg/dL (ref 0–149)
VLDL CHOLESTEROL CAL: 28 mg/dL (ref 5–40)

## 2016-04-24 LAB — HEPATITIS C ANTIBODY: HEP C VIRUS AB: 0.1 {s_co_ratio} (ref 0.0–0.9)

## 2016-04-24 LAB — URIC ACID: URIC ACID: 6.2 mg/dL (ref 3.7–8.6)

## 2016-04-24 LAB — VITAMIN B12: Vitamin B-12: 413 pg/mL (ref 211–946)

## 2016-05-22 ENCOUNTER — Other Ambulatory Visit: Payer: Self-pay | Admitting: Family Medicine

## 2016-06-11 ENCOUNTER — Other Ambulatory Visit: Payer: Self-pay | Admitting: Family Medicine

## 2016-07-14 ENCOUNTER — Ambulatory Visit (INDEPENDENT_AMBULATORY_CARE_PROVIDER_SITE_OTHER): Payer: Medicare Other | Admitting: Family Medicine

## 2016-07-14 ENCOUNTER — Encounter: Payer: Self-pay | Admitting: Family Medicine

## 2016-07-14 VITALS — BP 99/69 | HR 93 | Temp 97.6°F | Ht 70.0 in | Wt 184.1 lb

## 2016-07-14 DIAGNOSIS — S39012A Strain of muscle, fascia and tendon of lower back, initial encounter: Secondary | ICD-10-CM

## 2016-07-14 MED ORDER — PREDNISONE 20 MG PO TABS: ORAL_TABLET | ORAL | 0 refills | Status: DC

## 2016-07-14 NOTE — Progress Notes (Signed)
BP 99/69   Pulse 93   Temp 97.6 F (36.4 C) (Oral)   Ht 5\' 10"  (1.778 m)   Wt 184 lb 2 oz (83.5 kg)   BMI 26.42 kg/m    Subjective:    Patient ID: Michael Cervantes, male    DOB: [birth year], 70 y.o.   MRN: [id number]  HPI: Michael Cervantes is a 70 y.o. male presenting on 07/14/2016 for Back Pain (low back pain started after lifting christmas tree out of truck)   HPI Low back pain Patient has been having low back pain for the past week after lifting a Christmas tree out of a truck. The back pain his lower lumbar and bilateral in a bandlike area across to his back. He denies any radicular symptoms going down either of his legs. He has had sciatica previously and does not think he has at this time. He has been using heating pads and Tylenol which helped some but it has persisted so that is why he is coming in today. He denies any fevers or chills or overlying skin changes. He denies any numbness or weakness in either of his legs  Relevant past medical, surgical, family and social history reviewed and updated as indicated. Interim medical history since our last visit reviewed. Allergies and medications reviewed and updated.  Review of Systems  Constitutional: Negative for chills and fever.  Respiratory: Negative for shortness of breath and wheezing.   Cardiovascular: Negative for chest pain and leg swelling.  Musculoskeletal: Positive for back pain. Negative for gait problem.  Skin: Negative for rash.  Neurological: Negative for weakness and numbness.  All other systems reviewed and are negative.   Per HPI unless specifically indicated above      Objective:    BP 99/69   Pulse 93   Temp 97.6 F (36.4 C) (Oral)   Ht 5\' 10"  (1.778 m)   Wt 184 lb 2 oz (83.5 kg)   BMI 26.42 kg/m   Wt Readings from Last 3 Encounters:  07/14/16 184 lb 2 oz (83.5 kg)  04/23/16 182 lb 6 oz (82.7 kg)  03/18/16 180 lb (81.6 kg)    Physical Exam  Constitutional: He is oriented to person, place, and  time. He appears well-developed and well-nourished. No distress.  Eyes: Conjunctivae are normal. Right eye exhibits no discharge. Left eye exhibits no discharge. No scleral icterus.  Cardiovascular: Normal rate, regular rhythm, normal heart sounds and intact distal pulses.   No murmur heard. Pulmonary/Chest: Effort normal and breath sounds normal. No respiratory distress. He has no wheezes.  Abdominal: He exhibits no distension.  Musculoskeletal: Normal range of motion. He exhibits no edema.       Lumbar back: He exhibits tenderness (Negative straight leg raise bilaterally) and spasm. He exhibits normal range of motion, no bony tenderness, no deformity and no laceration.  Neurological: He is alert and oriented to person, place, and time. Coordination normal.  Skin: Skin is warm and dry. No rash noted. He is not diaphoretic.  Psychiatric: He has a normal mood and affect. His behavior is normal.  Vitals reviewed.     Assessment & Plan:   Problem List Items Addressed This Visit    None    Visit Diagnoses    Lumbar strain, initial encounter    -  Primary   We'll try short course of prednisone, no radicular symptoms, no numbness or weakness, patient did not want a muscle relaxer at this point  Follow up plan: Return if symptoms worsen or fail to improve.  Counseling provided for all of the vaccine components No orders of the defined types were placed in this encounter.   Caryl Pina, MD [facility] 07/14/2016, 2:22 PM

## 2016-07-30 DIAGNOSIS — M546 Pain in thoracic spine: Secondary | ICD-10-CM | POA: Diagnosis not present

## 2016-07-30 DIAGNOSIS — M9903 Segmental and somatic dysfunction of lumbar region: Secondary | ICD-10-CM | POA: Diagnosis not present

## 2016-07-30 DIAGNOSIS — M5442 Lumbago with sciatica, left side: Secondary | ICD-10-CM | POA: Diagnosis not present

## 2016-07-30 DIAGNOSIS — S338XXA Sprain of other parts of lumbar spine and pelvis, initial encounter: Secondary | ICD-10-CM | POA: Diagnosis not present

## 2016-07-30 DIAGNOSIS — M47816 Spondylosis without myelopathy or radiculopathy, lumbar region: Secondary | ICD-10-CM | POA: Diagnosis not present

## 2016-08-01 DIAGNOSIS — M9903 Segmental and somatic dysfunction of lumbar region: Secondary | ICD-10-CM | POA: Diagnosis not present

## 2016-08-01 DIAGNOSIS — M5442 Lumbago with sciatica, left side: Secondary | ICD-10-CM | POA: Diagnosis not present

## 2016-08-01 DIAGNOSIS — M546 Pain in thoracic spine: Secondary | ICD-10-CM | POA: Diagnosis not present

## 2016-08-01 DIAGNOSIS — S338XXA Sprain of other parts of lumbar spine and pelvis, initial encounter: Secondary | ICD-10-CM | POA: Diagnosis not present

## 2016-08-01 DIAGNOSIS — M47816 Spondylosis without myelopathy or radiculopathy, lumbar region: Secondary | ICD-10-CM | POA: Diagnosis not present

## 2016-08-04 DIAGNOSIS — M9903 Segmental and somatic dysfunction of lumbar region: Secondary | ICD-10-CM | POA: Diagnosis not present

## 2016-08-04 DIAGNOSIS — S338XXA Sprain of other parts of lumbar spine and pelvis, initial encounter: Secondary | ICD-10-CM | POA: Diagnosis not present

## 2016-08-04 DIAGNOSIS — M546 Pain in thoracic spine: Secondary | ICD-10-CM | POA: Diagnosis not present

## 2016-08-04 DIAGNOSIS — M5442 Lumbago with sciatica, left side: Secondary | ICD-10-CM | POA: Diagnosis not present

## 2016-08-04 DIAGNOSIS — M47816 Spondylosis without myelopathy or radiculopathy, lumbar region: Secondary | ICD-10-CM | POA: Diagnosis not present

## 2016-08-06 DIAGNOSIS — M9903 Segmental and somatic dysfunction of lumbar region: Secondary | ICD-10-CM | POA: Diagnosis not present

## 2016-08-06 DIAGNOSIS — S338XXA Sprain of other parts of lumbar spine and pelvis, initial encounter: Secondary | ICD-10-CM | POA: Diagnosis not present

## 2016-08-06 DIAGNOSIS — M47816 Spondylosis without myelopathy or radiculopathy, lumbar region: Secondary | ICD-10-CM | POA: Diagnosis not present

## 2016-08-07 ENCOUNTER — Other Ambulatory Visit: Payer: Self-pay | Admitting: Family Medicine

## 2016-08-08 DIAGNOSIS — S338XXA Sprain of other parts of lumbar spine and pelvis, initial encounter: Secondary | ICD-10-CM | POA: Diagnosis not present

## 2016-08-08 DIAGNOSIS — M9903 Segmental and somatic dysfunction of lumbar region: Secondary | ICD-10-CM | POA: Diagnosis not present

## 2016-08-08 DIAGNOSIS — M47816 Spondylosis without myelopathy or radiculopathy, lumbar region: Secondary | ICD-10-CM | POA: Diagnosis not present

## 2016-08-11 DIAGNOSIS — M9903 Segmental and somatic dysfunction of lumbar region: Secondary | ICD-10-CM | POA: Diagnosis not present

## 2016-08-11 DIAGNOSIS — M47816 Spondylosis without myelopathy or radiculopathy, lumbar region: Secondary | ICD-10-CM | POA: Diagnosis not present

## 2016-08-11 DIAGNOSIS — S338XXA Sprain of other parts of lumbar spine and pelvis, initial encounter: Secondary | ICD-10-CM | POA: Diagnosis not present

## 2016-08-15 DIAGNOSIS — H40033 Anatomical narrow angle, bilateral: Secondary | ICD-10-CM | POA: Diagnosis not present

## 2016-08-15 DIAGNOSIS — H2513 Age-related nuclear cataract, bilateral: Secondary | ICD-10-CM | POA: Diagnosis not present

## 2016-08-17 IMAGING — CR DG CHEST 2V
3 series · 3 of 3 positions shown · non-contrast
Comparison: PA and lateral chest x-ray September 12, 2014

CLINICAL DATA: Cough, low back pain radiating into the left lower
extremity.

EXAM:
CHEST  2 VIEW

[view not recorded (1 of 3)]
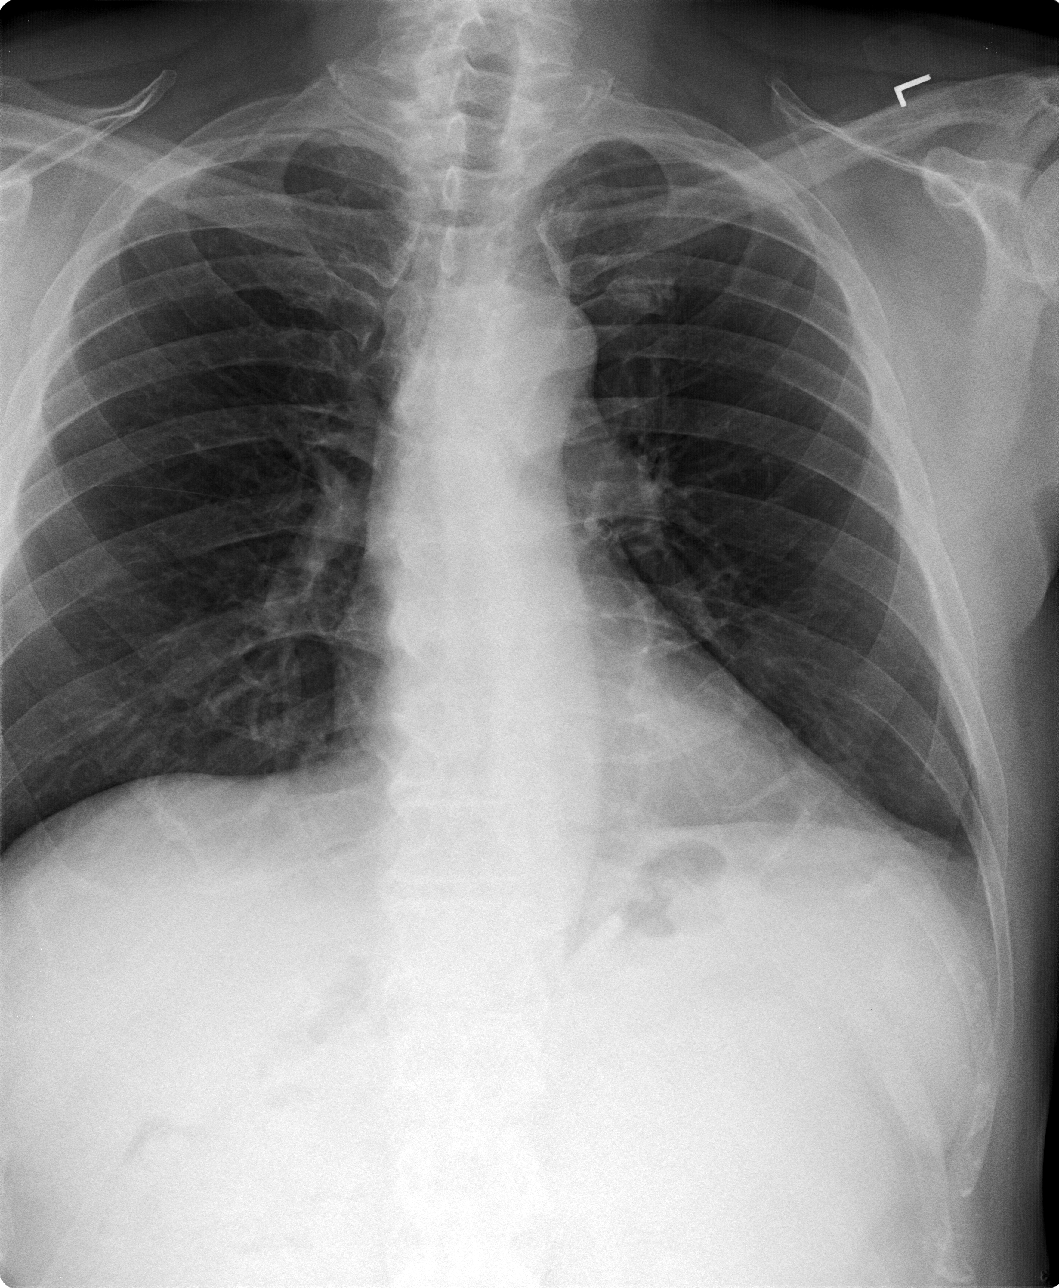

[view not recorded (2 of 3)]
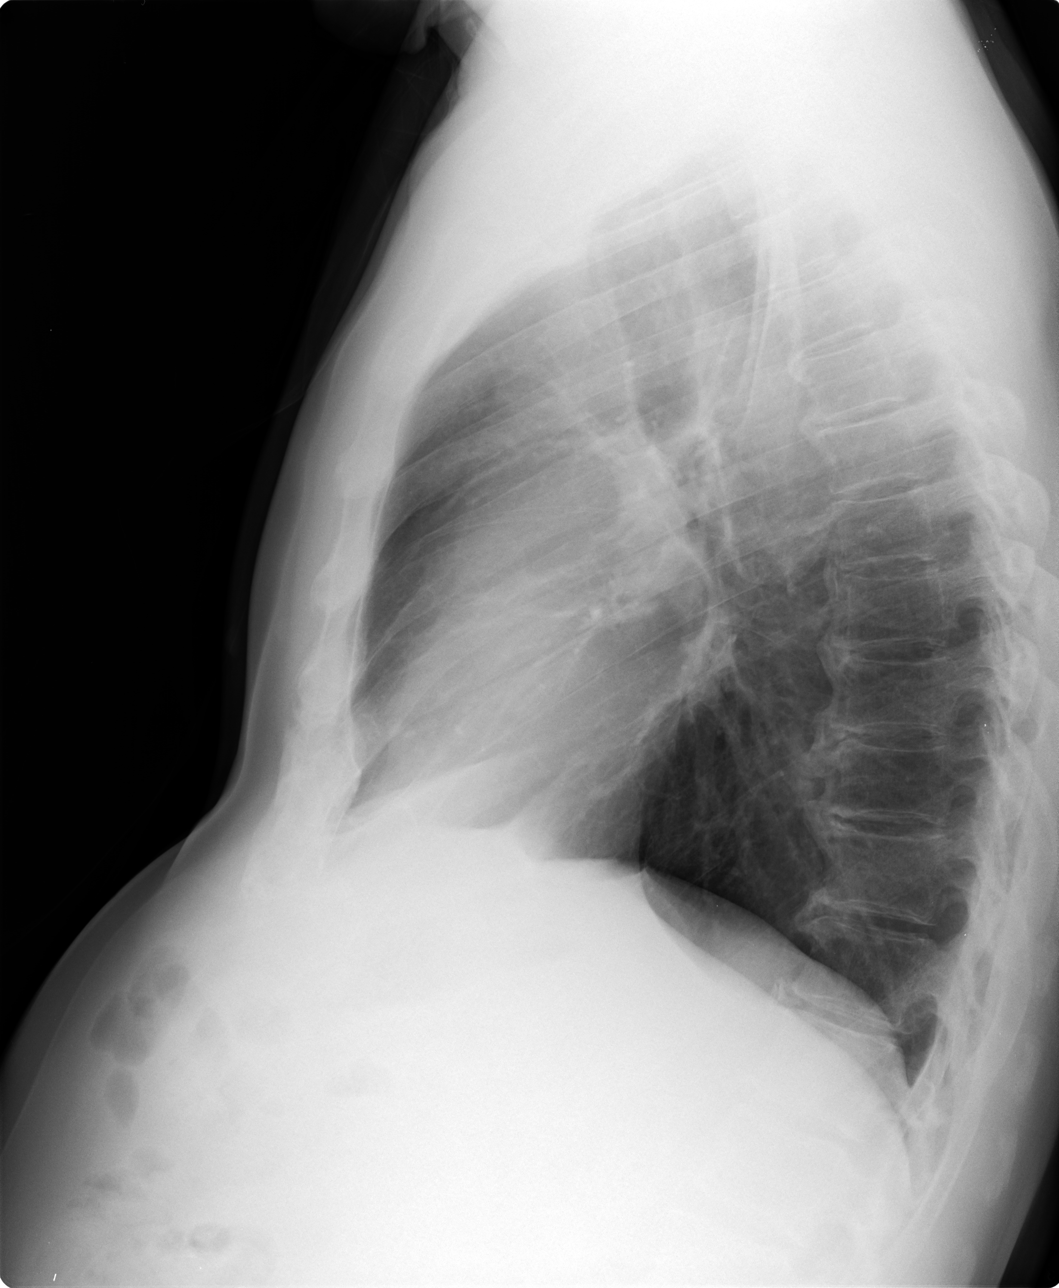

[view not recorded (3 of 3)]
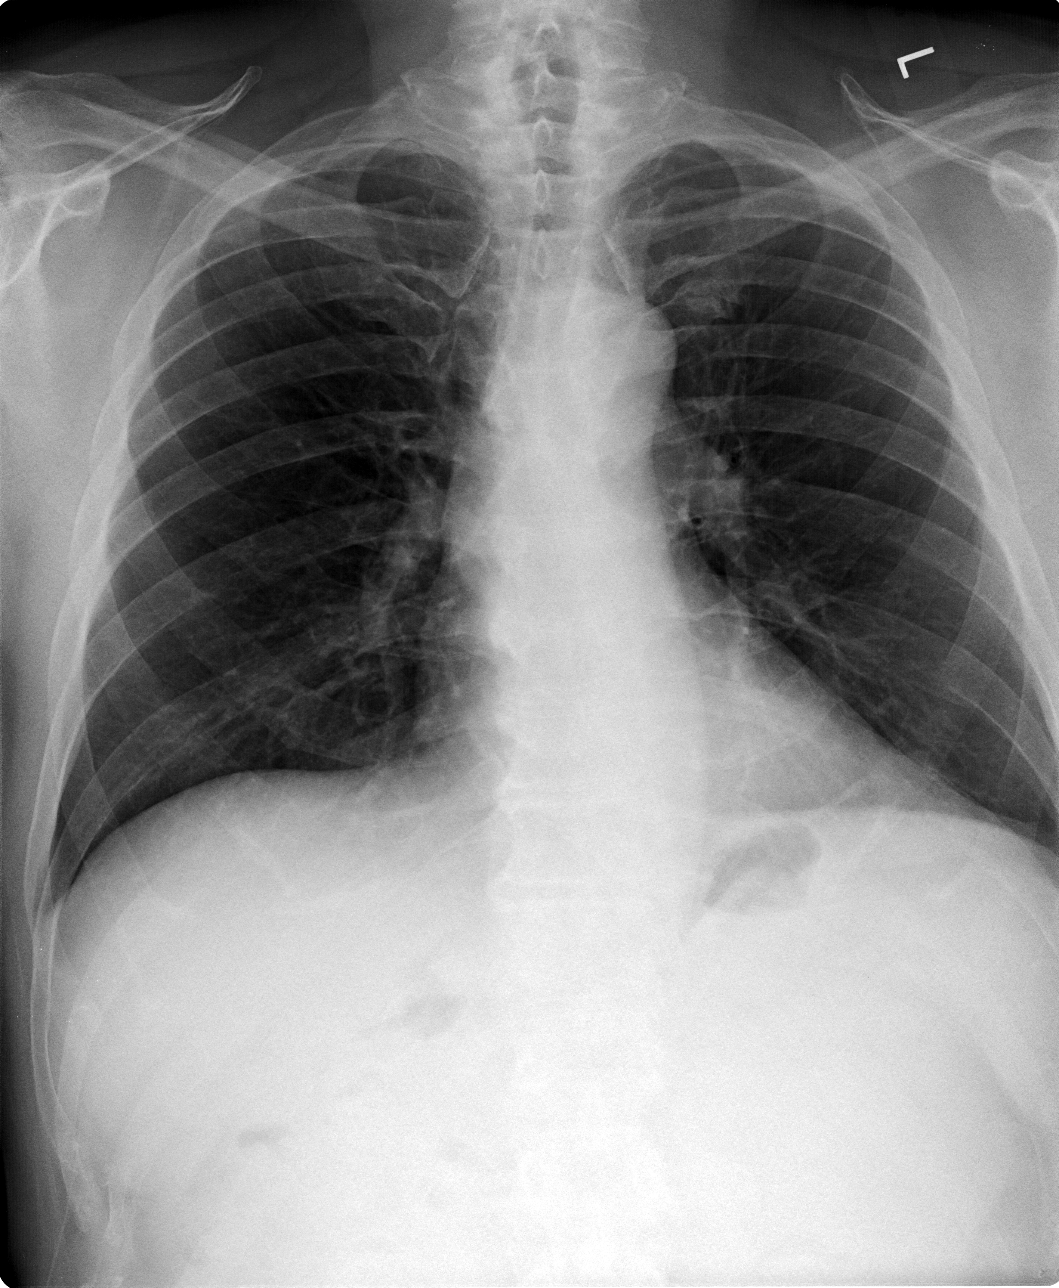

[3 of 3 positions shown; findings below may reference images not displayed]

FINDINGS: The lungs are adequately inflated. There is no focal infiltrate.
There is no pleural effusion. The heart and pulmonary vascularity
are normal. The mediastinum is normal in width. There are anterior
bridging osteophytes at multiple mid and lower thoracic levels.
There is no compression fracture.
IMPRESSION: There is no active cardiopulmonary disease.

## 2016-08-17 IMAGING — CR DG LUMBAR SPINE 2-3V
2 series · 2 of 2 positions shown · non-contrast
Comparison: Chest x-ray of today's date for purposes of vertebral
level labeling.

CLINICAL DATA: Low back pain with radiation to the left lower
extremity

EXAM:
LUMBAR SPINE - 2-3 VIEW

[view not recorded (1 of 2)]
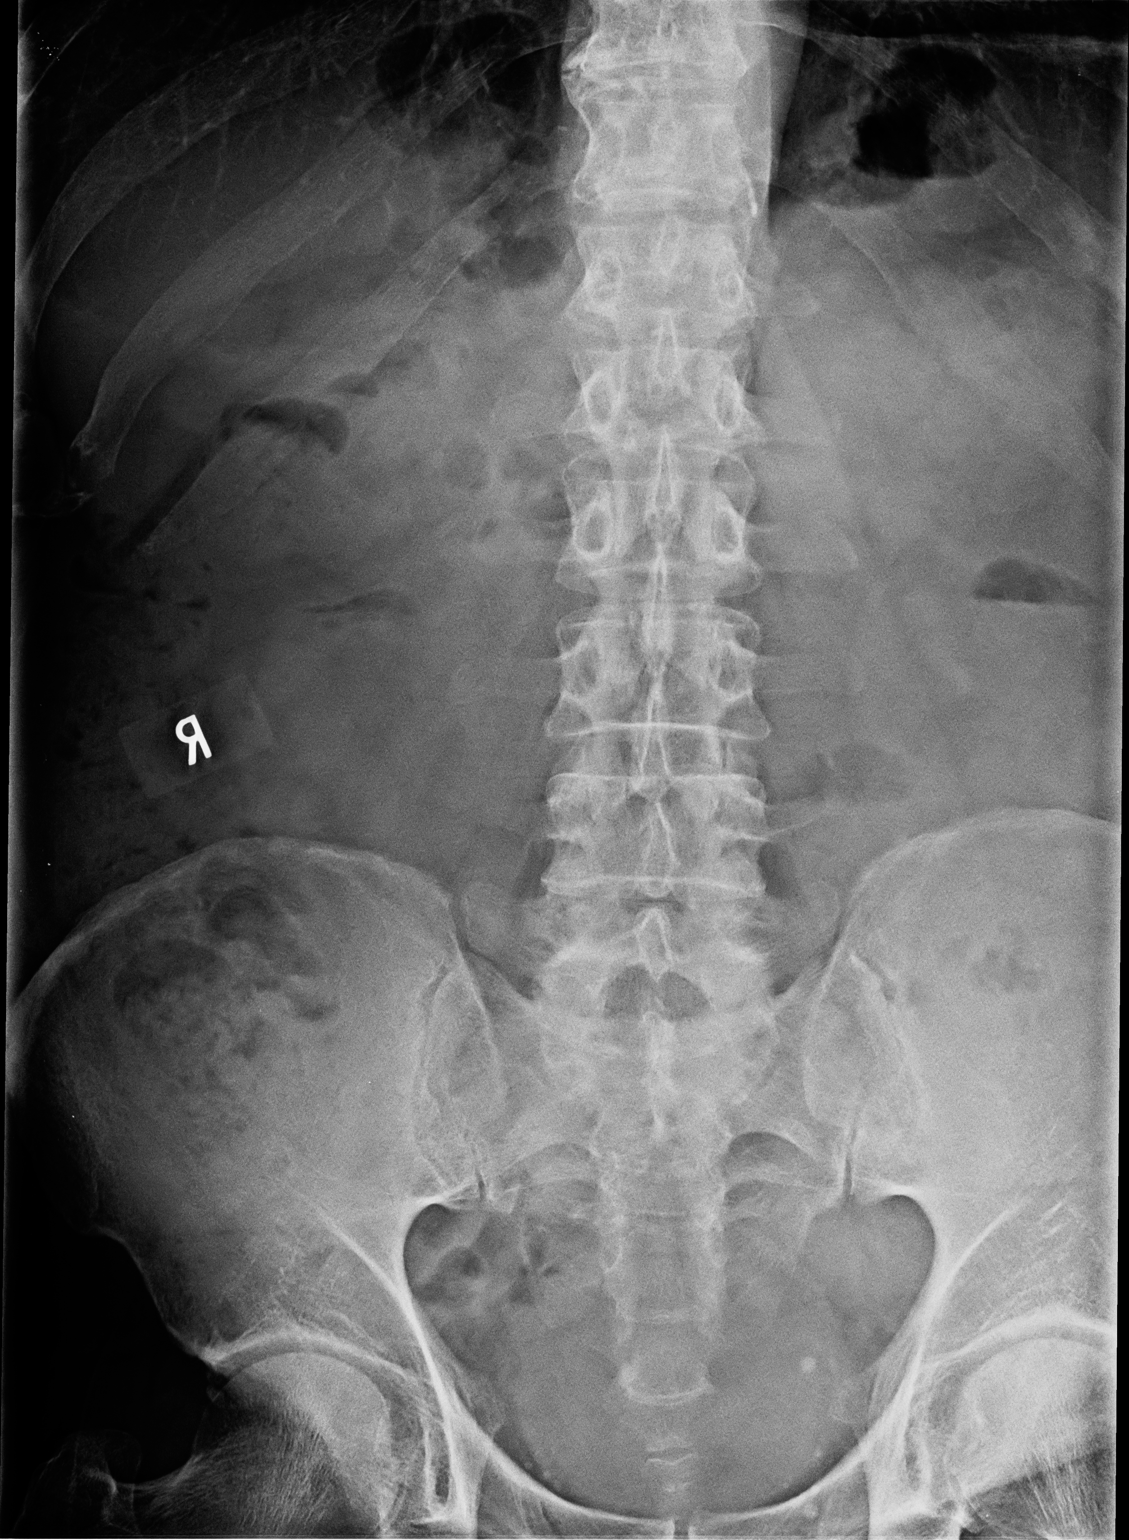

[view not recorded (2 of 2)]
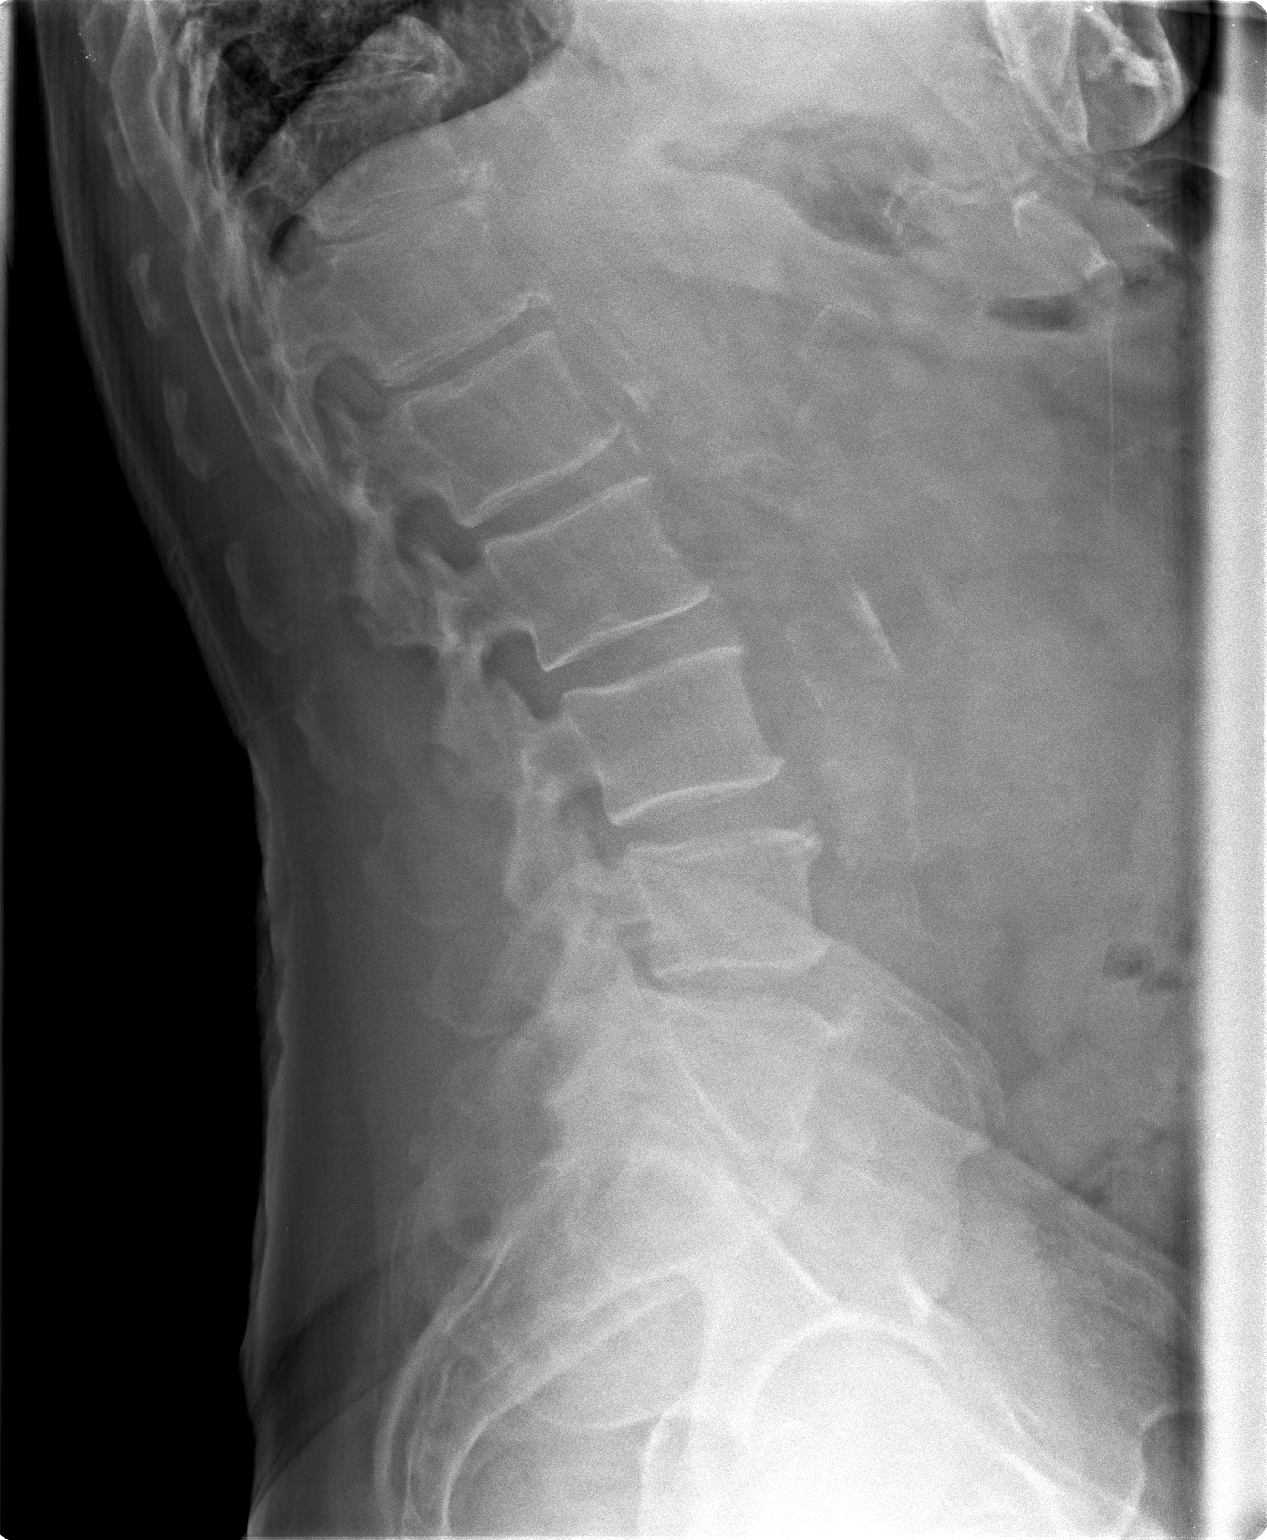

[2 of 2 positions shown; findings below may reference images not displayed]

FINDINGS: The lumbar vertebral bodies are preserved in height. S1 is
transitional. The pedicles and transverse processes are intact.
There is mild disc space narrowing at L5-S1. There is no
spondylolisthesis. Small anterior endplate osteophytes are noted at
L4-5 and L5-S1. There is facet joint hypertrophy at L5-S1 and S1-S2.
IMPRESSION: There is no compression fracture nor high-grade disc space
narrowing. There is mild disc space narrowing at L5-S1.

## 2016-08-18 DIAGNOSIS — M9903 Segmental and somatic dysfunction of lumbar region: Secondary | ICD-10-CM | POA: Diagnosis not present

## 2016-08-18 DIAGNOSIS — S338XXA Sprain of other parts of lumbar spine and pelvis, initial encounter: Secondary | ICD-10-CM | POA: Diagnosis not present

## 2016-08-18 DIAGNOSIS — M47816 Spondylosis without myelopathy or radiculopathy, lumbar region: Secondary | ICD-10-CM | POA: Diagnosis not present

## 2016-08-21 DIAGNOSIS — M9903 Segmental and somatic dysfunction of lumbar region: Secondary | ICD-10-CM | POA: Diagnosis not present

## 2016-08-21 DIAGNOSIS — S338XXA Sprain of other parts of lumbar spine and pelvis, initial encounter: Secondary | ICD-10-CM | POA: Diagnosis not present

## 2016-08-21 DIAGNOSIS — M47816 Spondylosis without myelopathy or radiculopathy, lumbar region: Secondary | ICD-10-CM | POA: Diagnosis not present

## 2016-08-25 DIAGNOSIS — S338XXA Sprain of other parts of lumbar spine and pelvis, initial encounter: Secondary | ICD-10-CM | POA: Diagnosis not present

## 2016-08-25 DIAGNOSIS — M9903 Segmental and somatic dysfunction of lumbar region: Secondary | ICD-10-CM | POA: Diagnosis not present

## 2016-08-25 DIAGNOSIS — M47816 Spondylosis without myelopathy or radiculopathy, lumbar region: Secondary | ICD-10-CM | POA: Diagnosis not present

## 2016-08-28 DIAGNOSIS — M47816 Spondylosis without myelopathy or radiculopathy, lumbar region: Secondary | ICD-10-CM | POA: Diagnosis not present

## 2016-08-28 DIAGNOSIS — M9903 Segmental and somatic dysfunction of lumbar region: Secondary | ICD-10-CM | POA: Diagnosis not present

## 2016-08-28 DIAGNOSIS — S338XXA Sprain of other parts of lumbar spine and pelvis, initial encounter: Secondary | ICD-10-CM | POA: Diagnosis not present

## 2016-09-01 DIAGNOSIS — I129 Hypertensive chronic kidney disease with stage 1 through stage 4 chronic kidney disease, or unspecified chronic kidney disease: Secondary | ICD-10-CM | POA: Diagnosis not present

## 2016-09-01 DIAGNOSIS — Z79899 Other long term (current) drug therapy: Secondary | ICD-10-CM | POA: Diagnosis not present

## 2016-09-01 DIAGNOSIS — D631 Anemia in chronic kidney disease: Secondary | ICD-10-CM | POA: Diagnosis not present

## 2016-09-01 DIAGNOSIS — Z888 Allergy status to other drugs, medicaments and biological substances status: Secondary | ICD-10-CM | POA: Diagnosis not present

## 2016-09-01 DIAGNOSIS — I1310 Hypertensive heart and chronic kidney disease without heart failure, with stage 1 through stage 4 chronic kidney disease, or unspecified chronic kidney disease: Secondary | ICD-10-CM | POA: Diagnosis not present

## 2016-09-01 DIAGNOSIS — E785 Hyperlipidemia, unspecified: Secondary | ICD-10-CM | POA: Diagnosis not present

## 2016-09-01 DIAGNOSIS — N183 Chronic kidney disease, stage 3 (moderate): Secondary | ICD-10-CM | POA: Diagnosis not present

## 2016-09-02 DIAGNOSIS — M47816 Spondylosis without myelopathy or radiculopathy, lumbar region: Secondary | ICD-10-CM | POA: Diagnosis not present

## 2016-09-02 DIAGNOSIS — M9903 Segmental and somatic dysfunction of lumbar region: Secondary | ICD-10-CM | POA: Diagnosis not present

## 2016-09-02 DIAGNOSIS — S338XXA Sprain of other parts of lumbar spine and pelvis, initial encounter: Secondary | ICD-10-CM | POA: Diagnosis not present

## 2016-09-09 ENCOUNTER — Other Ambulatory Visit: Payer: Self-pay | Admitting: Family Medicine

## 2016-09-09 DIAGNOSIS — M47816 Spondylosis without myelopathy or radiculopathy, lumbar region: Secondary | ICD-10-CM | POA: Diagnosis not present

## 2016-09-09 DIAGNOSIS — S338XXA Sprain of other parts of lumbar spine and pelvis, initial encounter: Secondary | ICD-10-CM | POA: Diagnosis not present

## 2016-09-09 DIAGNOSIS — M9903 Segmental and somatic dysfunction of lumbar region: Secondary | ICD-10-CM | POA: Diagnosis not present

## 2016-09-16 DIAGNOSIS — M47816 Spondylosis without myelopathy or radiculopathy, lumbar region: Secondary | ICD-10-CM | POA: Diagnosis not present

## 2016-09-16 DIAGNOSIS — M9903 Segmental and somatic dysfunction of lumbar region: Secondary | ICD-10-CM | POA: Diagnosis not present

## 2016-09-16 DIAGNOSIS — S338XXA Sprain of other parts of lumbar spine and pelvis, initial encounter: Secondary | ICD-10-CM | POA: Diagnosis not present

## 2016-09-30 DIAGNOSIS — S338XXA Sprain of other parts of lumbar spine and pelvis, initial encounter: Secondary | ICD-10-CM | POA: Diagnosis not present

## 2016-09-30 DIAGNOSIS — M47816 Spondylosis without myelopathy or radiculopathy, lumbar region: Secondary | ICD-10-CM | POA: Diagnosis not present

## 2016-09-30 DIAGNOSIS — M9903 Segmental and somatic dysfunction of lumbar region: Secondary | ICD-10-CM | POA: Diagnosis not present

## 2016-10-14 DIAGNOSIS — M9903 Segmental and somatic dysfunction of lumbar region: Secondary | ICD-10-CM | POA: Diagnosis not present

## 2016-10-14 DIAGNOSIS — M47816 Spondylosis without myelopathy or radiculopathy, lumbar region: Secondary | ICD-10-CM | POA: Diagnosis not present

## 2016-10-14 DIAGNOSIS — S338XXA Sprain of other parts of lumbar spine and pelvis, initial encounter: Secondary | ICD-10-CM | POA: Diagnosis not present

## 2016-10-21 ENCOUNTER — Ambulatory Visit (INDEPENDENT_AMBULATORY_CARE_PROVIDER_SITE_OTHER): Payer: Medicare Other | Admitting: Family Medicine

## 2016-10-21 ENCOUNTER — Other Ambulatory Visit: Payer: Self-pay | Admitting: *Deleted

## 2016-10-21 ENCOUNTER — Encounter: Payer: Self-pay | Admitting: Family Medicine

## 2016-10-21 VITALS — BP 125/80 | HR 61 | Temp 97.3°F | Ht 70.0 in | Wt 191.0 lb

## 2016-10-21 DIAGNOSIS — B354 Tinea corporis: Secondary | ICD-10-CM

## 2016-10-21 DIAGNOSIS — N189 Chronic kidney disease, unspecified: Secondary | ICD-10-CM

## 2016-10-21 DIAGNOSIS — D631 Anemia in chronic kidney disease: Secondary | ICD-10-CM | POA: Diagnosis not present

## 2016-10-21 DIAGNOSIS — N183 Chronic kidney disease, stage 3 unspecified: Secondary | ICD-10-CM

## 2016-10-21 DIAGNOSIS — M1A3790 Chronic gout due to renal impairment, unspecified ankle and foot, without tophus (tophi): Secondary | ICD-10-CM

## 2016-10-21 DIAGNOSIS — Q613 Polycystic kidney, unspecified: Secondary | ICD-10-CM

## 2016-10-21 DIAGNOSIS — E782 Mixed hyperlipidemia: Secondary | ICD-10-CM

## 2016-10-21 MED ORDER — TRIAMCINOLONE 0.1 % CREAM:EUCERIN CREAM 1:1: 1.0000 "application " | TOPICAL_CREAM | Freq: Three times a day (TID) | CUTANEOUS | 5 refills | Status: DC | PRN

## 2016-10-21 MED ORDER — FLUCONAZOLE 100 MG PO TABS: 100.0000 mg | ORAL_TABLET | Freq: Every day | ORAL | 0 refills | Status: DC

## 2016-10-21 MED ORDER — GABAPENTIN 300 MG PO CAPS: ORAL_CAPSULE | ORAL | 2 refills | Status: DC

## 2016-10-21 NOTE — Progress Notes (Signed)
Subjective:  Patient ID: Michael Cervantes, male    DOB: 03/10/1946  Age: 71 y.o. MRN: [id number]  CC: Follow up chronic medical problems   HPI Michael Cervantes presents for recheck of gout, cholesterol and renal function. His renal function has been stable recently. Followed also by nephrology. He has had decreased Hb due to renal function. Denies dyspnea and fatigue. Staying active. No recent gout attack. Hasn't needed colchicine for a long time. 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 Denies cardia, cerebrovascular sx. No adverse effects of statin. Specifically, no significant myslgias or arthralgias.   History Michael Cervantes has a past medical history of Anemia; Arthritis; Bulging lumbar disc; Cellulitis (08/2014); Gout; Hyperlipidemia; OA (osteoarthritis) of foot; Polycystic kidney disease; and Sciatica (2016).   He has a past surgical history that includes Cyst excision and Tonsillectomy.   His family history includes Alzheimer's disease in his mother; Arthritis in his sister; Cancer in his father; Dementia in his mother.He reports that he quit smoking about 27 years ago. He has a 35.00 pack-year smoking history. His smokeless tobacco use includes Snuff. He reports that he does not drink alcohol or use drugs.    ROS Review of Systems  Constitutional: Negative for chills, diaphoresis, fever and unexpected weight change.  HENT: Negative for congestion, hearing loss, rhinorrhea and sore throat.   Eyes: Negative for visual disturbance.  Respiratory: Negative for cough and shortness of breath.   Cardiovascular: Negative for chest pain.  Gastrointestinal: Negative for abdominal pain, constipation and diarrhea.  Genitourinary: Negative for dysuria and flank pain.  Musculoskeletal: Negative for arthralgias and joint swelling.  Skin: Negative for rash.  Neurological: Negative for dizziness and headaches.  Psychiatric/Behavioral: Negative for dysphoric  mood and sleep disturbance.    Objective:  BP 125/80   Pulse 61   Temp 97.3 F (36.3 C) (Oral)   Ht '5\' 10"'  (1.778 m)   Wt 191 lb (86.6 kg)   BMI 27.41 kg/m   BP Readings from Last 3 Encounters:  10/21/16 125/80  07/14/16 99/69  04/23/16 (!) 107/59    Wt Readings from Last 3 Encounters:  10/21/16 191 lb (86.6 kg)  07/14/16 184 lb 2 oz (83.5 kg)  04/23/16 182 lb 6 oz (82.7 kg)     Physical Exam  Constitutional: He is oriented to person, place, and time. He appears well-developed and well-nourished. No distress.  HENT:  Head: Normocephalic and atraumatic.  Right Ear: External ear normal.  Left Ear: External ear normal.  Nose: Nose normal.  Mouth/Throat: Oropharynx is clear and moist.  Eyes: Conjunctivae and EOM are normal. Pupils are equal, round, and reactive to light.  Neck: Normal range of motion. Neck supple. No thyromegaly present.  Cardiovascular: Normal rate, regular rhythm and normal heart sounds.   No murmur heard. Pulmonary/Chest: Effort normal and breath sounds normal. No respiratory distress. He has no wheezes. He has no rales.  Abdominal: Soft. Bowel sounds are normal. He exhibits no distension. There is no tenderness.  Lymphadenopathy:    He has no cervical adenopathy.  Neurological: He is alert and oriented to person, place, and time. He has normal reflexes.  Skin: Skin is warm and dry.  Psychiatric: He has a normal mood and affect. His behavior is normal. Judgment and thought content normal.      Assessment & Plan:   [name] was seen today for follow up chronic medical problems.  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 -     Discontinue: Triamcinolone Acetonide (TRIAMCINOLONE 0.1 % CREAM : EUCERIN) CREA; Apply 1 application topically 3 (three) times daily as needed. -     gabapentin (NEURONTIN) 300 MG capsule; 1 at bedtime 1 week  Then 2 at bedtime for burning in feet       I have discontinued Michael Cervantes predniSONE. I am also having him start on fluconazole and gabapentin. Additionally, I am having him maintain his Multiple Vitamins-Minerals (ONE-A-DAY MENS HEALTH FORMULA PO), allopurinol, colchicine, furosemide, lisinopril, and atorvastatin.  Allergies as of 10/21/2016      Reactions   Cefaclor Rash      Medication List       Accurate as of 10/21/16  8:51 PM. Always use your most recent med list.          allopurinol 100 MG tablet Commonly known as:  ZYLOPRIM Take 2 tablets (200 mg total) by mouth daily.   atorvastatin 40 MG tablet Commonly known as:  LIPITOR TAKE 1 TABLET (40 MG TOTAL) BY MOUTH DAILY. FOR CHOLESTEROL   colchicine 0.6 MG tablet TAKE 2 TABS BY MOUTH NOW THEN 1 AN HOUR LATER, THEN 1 TWICE A DAY UNTIL COMPLETE RELIEF THEN 1 DAILY   fluconazole 100 MG tablet Commonly known as:  DIFLUCAN Take 1 tablet (100 mg total) by mouth daily.   furosemide 40 MG tablet Commonly known as:  LASIX TAKE 1 TABLET (40 MG TOTAL) BY MOUTH DAILY.   gabapentin 300 MG capsule Commonly known as:  NEURONTIN 1 at bedtime 1 week  Then 2 at bedtime for burning in feet   lisinopril 10 MG tablet Commonly known as:  PRINIVIL,ZESTRIL TAKE 1 TABLET (10 MG TOTAL) BY MOUTH DAILY.   ONE-A-DAY MENS HEALTH FORMULA PO Take 1 tablet by mouth daily.   triamcinolone 0.1 % cream  : eucerin Crea Apply 1 application topically 3 (three) times daily as needed.        Follow-up: Return if symptoms worsen or fail to improve.  Claretta Fraise, M.D.

## 2016-10-22 LAB — CMP14+EGFR
A/G RATIO: 1.5 (ref 1.2–2.2)
ALT: 23 IU/L (ref 0–44)
AST: 21 IU/L (ref 0–40)
Albumin: 4.2 g/dL (ref 3.5–4.8)
Alkaline Phosphatase: 110 IU/L (ref 39–117)
BUN/Creatinine Ratio: 16 (ref 10–24)
BUN: 31 mg/dL — AB (ref 8–27)
Bilirubin Total: 0.6 mg/dL (ref 0.0–1.2)
CALCIUM: 9.9 mg/dL (ref 8.6–10.2)
CO2: 23 mmol/L (ref 18–29)
CREATININE: 1.96 mg/dL — AB (ref 0.76–1.27)
Chloride: 99 mmol/L (ref 96–106)
GFR calc Af Amer: 39 mL/min/{1.73_m2} — ABNORMAL LOW (ref >59)
GFR, EST NON AFRICAN AMERICAN: 33 mL/min/{1.73_m2} — AB (ref >59)
Globulin, Total: 2.8 g/dL (ref 1.5–4.5)
Glucose: 88 mg/dL (ref 65–99)
POTASSIUM: 4.1 mmol/L (ref 3.5–5.2)
Sodium: 139 mmol/L (ref 134–144)
TOTAL PROTEIN: 7 g/dL (ref 6.0–8.5)

## 2016-10-22 LAB — CBC WITH DIFFERENTIAL/PLATELET
Basophils Absolute: 0 10*3/uL (ref 0.0–0.2)
Basos: 0 %
EOS (ABSOLUTE): 0.4 10*3/uL (ref 0.0–0.4)
Eos: 4 %
HEMOGLOBIN: 12.6 g/dL — AB (ref 13.0–17.7)
Hematocrit: 37.1 % — ABNORMAL LOW (ref 37.5–51.0)
IMMATURE GRANS (ABS): 0 10*3/uL (ref 0.0–0.1)
IMMATURE GRANULOCYTES: 0 %
LYMPHS: 23 %
Lymphocytes Absolute: 2.2 10*3/uL (ref 0.7–3.1)
MCH: 32.7 pg (ref 26.6–33.0)
MCHC: 34 g/dL (ref 31.5–35.7)
MCV: 96 fL (ref 79–97)
MONOCYTES: 8 %
Monocytes Absolute: 0.7 10*3/uL (ref 0.1–0.9)
Neutrophils Absolute: 6.2 10*3/uL (ref 1.4–7.0)
Neutrophils: 65 %
PLATELETS: 264 10*3/uL (ref 150–379)
RBC: 3.85 x10E6/uL — ABNORMAL LOW (ref 4.14–5.80)
RDW: 13.8 % (ref 12.3–15.4)
WBC: 9.5 10*3/uL (ref 3.4–10.8)

## 2016-10-22 LAB — LIPID PANEL
CHOLESTEROL TOTAL: 181 mg/dL (ref 100–199)
Chol/HDL Ratio: 4.1 ratio units (ref 0.0–5.0)
HDL: 44 mg/dL (ref >39)
LDL Calculated: 98 mg/dL (ref 0–99)
Triglycerides: 193 mg/dL — ABNORMAL HIGH (ref 0–149)
VLDL Cholesterol Cal: 39 mg/dL (ref 5–40)

## 2016-10-22 LAB — URIC ACID: Uric Acid: 6.2 mg/dL (ref 3.7–8.6)

## 2016-10-29 DIAGNOSIS — S338XXA Sprain of other parts of lumbar spine and pelvis, initial encounter: Secondary | ICD-10-CM | POA: Diagnosis not present

## 2016-10-29 DIAGNOSIS — M9903 Segmental and somatic dysfunction of lumbar region: Secondary | ICD-10-CM | POA: Diagnosis not present

## 2016-10-29 DIAGNOSIS — M47816 Spondylosis without myelopathy or radiculopathy, lumbar region: Secondary | ICD-10-CM | POA: Diagnosis not present

## 2016-11-15 ENCOUNTER — Other Ambulatory Visit: Payer: Self-pay | Admitting: Family Medicine

## 2016-11-26 DIAGNOSIS — S338XXA Sprain of other parts of lumbar spine and pelvis, initial encounter: Secondary | ICD-10-CM | POA: Diagnosis not present

## 2016-11-26 DIAGNOSIS — M9903 Segmental and somatic dysfunction of lumbar region: Secondary | ICD-10-CM | POA: Diagnosis not present

## 2016-11-26 DIAGNOSIS — M47816 Spondylosis without myelopathy or radiculopathy, lumbar region: Secondary | ICD-10-CM | POA: Diagnosis not present

## 2016-12-05 ENCOUNTER — Other Ambulatory Visit: Payer: Self-pay | Admitting: Family Medicine

## 2016-12-07 ENCOUNTER — Other Ambulatory Visit: Payer: Self-pay | Admitting: Family Medicine

## 2017-01-21 DIAGNOSIS — M9903 Segmental and somatic dysfunction of lumbar region: Secondary | ICD-10-CM | POA: Diagnosis not present

## 2017-01-21 DIAGNOSIS — M47816 Spondylosis without myelopathy or radiculopathy, lumbar region: Secondary | ICD-10-CM | POA: Diagnosis not present

## 2017-01-21 DIAGNOSIS — S338XXA Sprain of other parts of lumbar spine and pelvis, initial encounter: Secondary | ICD-10-CM | POA: Diagnosis not present

## 2017-01-29 ENCOUNTER — Other Ambulatory Visit: Payer: Self-pay | Admitting: Family Medicine

## 2017-03-02 DIAGNOSIS — E785 Hyperlipidemia, unspecified: Secondary | ICD-10-CM | POA: Diagnosis not present

## 2017-03-02 DIAGNOSIS — N183 Chronic kidney disease, stage 3 (moderate): Secondary | ICD-10-CM | POA: Diagnosis not present

## 2017-03-02 DIAGNOSIS — D631 Anemia in chronic kidney disease: Secondary | ICD-10-CM | POA: Diagnosis not present

## 2017-03-02 DIAGNOSIS — I129 Hypertensive chronic kidney disease with stage 1 through stage 4 chronic kidney disease, or unspecified chronic kidney disease: Secondary | ICD-10-CM | POA: Diagnosis not present

## 2017-03-10 ENCOUNTER — Encounter: Payer: Self-pay | Admitting: *Deleted

## 2017-03-13 ENCOUNTER — Other Ambulatory Visit: Payer: Self-pay | Admitting: Family Medicine

## 2017-03-18 DIAGNOSIS — M47816 Spondylosis without myelopathy or radiculopathy, lumbar region: Secondary | ICD-10-CM | POA: Diagnosis not present

## 2017-03-18 DIAGNOSIS — S338XXA Sprain of other parts of lumbar spine and pelvis, initial encounter: Secondary | ICD-10-CM | POA: Diagnosis not present

## 2017-03-18 DIAGNOSIS — M9903 Segmental and somatic dysfunction of lumbar region: Secondary | ICD-10-CM | POA: Diagnosis not present

## 2017-03-25 ENCOUNTER — Encounter: Payer: Self-pay | Admitting: *Deleted

## 2017-03-25 ENCOUNTER — Ambulatory Visit (INDEPENDENT_AMBULATORY_CARE_PROVIDER_SITE_OTHER): Payer: Medicare Other | Admitting: *Deleted

## 2017-03-25 VITALS — BP 112/66 | HR 58 | Ht 69.5 in | Wt 180.0 lb

## 2017-03-25 DIAGNOSIS — Z0000 Encounter for general adult medical examination without abnormal findings: Secondary | ICD-10-CM

## 2017-03-25 DIAGNOSIS — H2513 Age-related nuclear cataract, bilateral: Secondary | ICD-10-CM | POA: Diagnosis not present

## 2017-03-25 DIAGNOSIS — H40033 Anatomical narrow angle, bilateral: Secondary | ICD-10-CM | POA: Diagnosis not present

## 2017-03-25 NOTE — Progress Notes (Addendum)
Subjective:   Michael Cervantes is a 71 y.o. male who presents for a subsequent Annual Wellness Visit. 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 His usual route is to [place] or [place]. He lives at home with his wife who is a Marine scientist. He has an adult daughter and an adult son and 2 grandsons. He enjoys gardening and yardwork and walks his dog for 2 miles each morning. He enjoys reading and has a book with him today.  Review of Systems  Reports that his health is better than last year.   Cardiac Risk Factors include: advanced age (>91mn, >>39women);dyslipidemia;male gender;hypertension  Musculoskeletal: Left knee pain and locking  Other systems negative today.    Objective:    Today's Vitals   03/25/17 1101  BP: 112/66  Pulse: (!) 58  Weight: 180 lb (81.6 kg)  Height: 5' 9.5" (1.765 m)   Body mass index is 26.2 kg/m.  Current Medications (verified) Outpatient Encounter Prescriptions as of 03/25/2017  Medication Sig  . allopurinol (ZYLOPRIM) 100 MG tablet TAKE 2 TABLETS (200 MG TOTAL) BY MOUTH DAILY.  .Marland Kitchenatorvastatin (LIPITOR) 40 MG tablet TAKE 1 TABLET (40 MG TOTAL) BY MOUTH DAILY. FOR CHOLESTEROL  . colchicine 0.6 MG tablet TAKE 2 TABS BY MOUTH NOW THEN 1 AN HOUR LATER, THEN 1 TWICE A DAY UNTIL COMPLETE RELIEF THEN 1 DAILY  . furosemide (LASIX) 40 MG tablet TAKE 1 TABLET (40 MG TOTAL) BY MOUTH DAILY.  .Marland Kitchenlisinopril (PRINIVIL,ZESTRIL) 10 MG tablet TAKE 1 TABLET (10 MG TOTAL) BY MOUTH DAILY.  . Multiple Vitamins-Minerals (ONE-A-DAY MENS HEALTH FORMULA PO) Take 1 tablet by mouth daily.  . Triamcinolone Acetonide (TRIAMCINOLONE 0.1 % CREAM : EUCERIN) CREA Apply 1 application topically 3 (three) times daily as needed.  . gabapentin (NEURONTIN) 300 MG capsule 1 at bedtime 1 week  Then 2 at bedtime for burning in feet (Patient not taking: Reported on 03/25/2017)  . [DISCONTINUED] fluconazole (DIFLUCAN) 100 MG tablet Take 1 tablet (100 mg total) by mouth  daily.   No facility-administered encounter medications on file as of 03/25/2017.     Allergies (verified) Cefaclor   History: Past Medical History:  Diagnosis Date  . Anemia   . Arthritis   . Bulging lumbar disc   . Cellulitis 08/2014   rt foot  . Gout   . Hyperlipidemia   . OA (osteoarthritis) of foot    rt foot  . Polycystic kidney disease   . Sciatica 2016   Past Surgical History:  Procedure Laterality Date  . CYST EXCISION     tail bone  . TONSILLECTOMY     Family History  Problem Relation Age of Onset  . Dementia Mother   . Alzheimer's disease Mother   . Cancer Father        lymphoma met to brain  . Arthritis Sister    Social History   Occupational History  . Not on file.   Social History Main Topics  . Smoking status: Former Smoker    Packs/day: 1.00    Years: 35.00    Quit date: 07/28/1989  . Smokeless tobacco: Current User    Types: Snuff  . Alcohol use No  . Drug use: No  . Sexual activity: Yes   Tobacco Counseling No current tobacco use  Activities of Daily Living In your present state of health, do you have any difficulty performing the following activities: 03/25/2017  Hearing? Y  Comment Has noticed  decline in hearing in both ears. 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 Y  Comment Cataracts right eye  Difficulty concentrating or making decisions? N  Walking or climbing stairs? N  Dressing or bathing? N  Doing errands, shopping? N  Some recent data might be hidden    Immunizations and Health Maintenance Immunization History  Administered Date(s) Administered  . Influenza, High Dose Seasonal PF 04/23/2016  . Influenza,inj,Quad PF,6+ Mos 06/18/2015  . Influenza-Unspecified 06/12/2014  . Pneumococcal Conjugate-13 10/19/2014  . Pneumococcal Polysaccharide-23 07/28/2005  . Tdap 12/13/2004  . Zoster 06/18/2015   Health Maintenance Due  Topic Date Due  . PNA vac Low Risk Adult (2 of 2 - PPSV23) 10/19/2015  . INFLUENZA VACCINE   02/25/2017    Patient Care Team: Claretta Fraise, MD as PCP - General (Family Medicine) Rocco, Micheal Likens, MD as Referring Physician (Nephrology)  Anthony Sar, OD-optometrist  No hospitalizations, ER visits or surgeries this past year.    Assessment:   This is a routine wellness examination for Michael Cervantes.   Hearing/Vision screen No deficits noted during visit. Going for eye exam today.   Dietary issues and exercise activities discussed: Current Exercise Habits: Home exercise routine (Walks daily during the week and gardens and does yardwork), Type of exercise: walking, Frequency (Times/Week): 5, Intensity: Moderate, Exercise limited by: None identified  Diet: Eats two meals a day. 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   Goals    . Exercise 3x per week (30 min per time)      Depression Screen PHQ 2/9 Scores 03/25/2017 10/21/2016 07/14/2016 04/23/2016  PHQ - 2 Score 0 0 0 0    Fall Risk Fall Risk  03/25/2017 10/21/2016 07/14/2016 04/23/2016 03/18/2016  Falls in the past year? No No No No No    Cognitive Function: MMSE - Mini Mental State Exam 03/25/2017 03/18/2016 11/22/2014  Orientation to time '5 5 5  ' Orientation to Place '5 5 5  ' Registration '3 3 3  ' Attention/ Calculation '3 5 5  ' Recall '2 3 3  ' Language- name 2 objects '2 2 2  ' Language- repeat '1 1 1  ' Language- follow 3 step command '3 3 3  ' Language- read & follow direction '1 1 1  ' Write a sentence '1 1 1  ' Copy design '1 1 1  ' Total score '27 30 30        ' Screening Tests Health Maintenance  Topic Date Due  . PNA vac Low Risk Adult (2 of 2 - PPSV23) 10/19/2015  . INFLUENZA VACCINE  02/25/2017  . TETANUS/TDAP  10/10/2021  . COLONOSCOPY  10/24/2025  . Hepatitis C Screening  Completed        Plan:   Keep f/u with Dr Livia Snellen Continue to stay active [employer] for at least 150 minutes of moderate activity per week  I have personally reviewed and noted the following in the patient's chart:   . Medical and social history . Use of alcohol,  tobacco or illicit drugs  . Current medications and supplements . Functional ability and status . Nutritional status . Physical activity . Advanced directives . List of other physicians . Hospitalizations, surgeries, and ER visits in previous 12 months . Vitals . Screenings to include cognitive, depression, and falls . Referrals and appointments  In addition, I have reviewed and discussed with patient certain preventive protocols, quality metrics, and best practice recommendations. A written personalized care plan for preventive services as well as general preventive health recommendations were provided to patient.  Chong Sicilian, RN   03/25/2017

## 2017-03-25 NOTE — Patient Instructions (Signed)
  Michael Cervantes ,  Thank you for taking time to come for your Medicare Wellness Visit. I appreciate your ongoing commitment to your health goals. Please review the following plan we discussed and let me know if I can assist you in the future.   These are the goals we discussed: Goals    . Exercise 3x per week (30 min per time)       This is a list of the screening recommended for you and due dates:  Health Maintenance  Topic Date Due  . Pneumonia vaccines (2 of 2 - PPSV23) 10/19/2015  . Flu Shot  02/25/2017  . Tetanus Vaccine  10/10/2021  . Colon Cancer Screening  10/24/2025  .  Hepatitis C: One time screening is recommended by Center for Disease Control  (CDC) for  adults born from [birth year] through 1966.   Completed

## 2017-04-24 ENCOUNTER — Encounter: Payer: Self-pay | Admitting: Family Medicine

## 2017-04-24 ENCOUNTER — Ambulatory Visit (INDEPENDENT_AMBULATORY_CARE_PROVIDER_SITE_OTHER): Payer: Medicare Other | Admitting: Family Medicine

## 2017-04-24 VITALS — BP 106/57 | HR 66 | Temp 97.8°F | Ht 69.5 in | Wt 182.0 lb

## 2017-04-24 DIAGNOSIS — E559 Vitamin D deficiency, unspecified: Secondary | ICD-10-CM | POA: Diagnosis not present

## 2017-04-24 DIAGNOSIS — Z23 Encounter for immunization: Secondary | ICD-10-CM | POA: Diagnosis not present

## 2017-04-24 DIAGNOSIS — E782 Mixed hyperlipidemia: Secondary | ICD-10-CM | POA: Diagnosis not present

## 2017-04-24 DIAGNOSIS — M1A3790 Chronic gout due to renal impairment, unspecified ankle and foot, without tophus (tophi): Secondary | ICD-10-CM | POA: Diagnosis not present

## 2017-04-24 DIAGNOSIS — N183 Chronic kidney disease, stage 3 unspecified: Secondary | ICD-10-CM

## 2017-04-24 DIAGNOSIS — N189 Chronic kidney disease, unspecified: Secondary | ICD-10-CM

## 2017-04-24 DIAGNOSIS — Z125 Encounter for screening for malignant neoplasm of prostate: Secondary | ICD-10-CM

## 2017-04-24 DIAGNOSIS — D631 Anemia in chronic kidney disease: Secondary | ICD-10-CM | POA: Diagnosis not present

## 2017-04-24 DIAGNOSIS — Q613 Polycystic kidney, unspecified: Secondary | ICD-10-CM | POA: Diagnosis not present

## 2017-04-24 MED ORDER — ALLOPURINOL 100 MG PO TABS: 200.0000 mg | ORAL_TABLET | Freq: Every day | ORAL | 0 refills | Status: DC

## 2017-04-24 MED ORDER — TRIAMCINOLONE 0.1 % CREAM:EUCERIN CREAM 1:1: 1.0000 "application " | TOPICAL_CREAM | Freq: Three times a day (TID) | CUTANEOUS | 5 refills | Status: DC | PRN

## 2017-04-24 MED ORDER — FUROSEMIDE 40 MG PO TABS: 40.0000 mg | ORAL_TABLET | Freq: Every day | ORAL | 1 refills | Status: DC

## 2017-04-24 MED ORDER — COLCHICINE 0.6 MG PO TABS: ORAL_TABLET | ORAL | 3 refills | Status: DC

## 2017-04-24 MED ORDER — LISINOPRIL 10 MG PO TABS: 10.0000 mg | ORAL_TABLET | Freq: Every day | ORAL | 1 refills | Status: DC

## 2017-04-24 MED ORDER — ATORVASTATIN CALCIUM 40 MG PO TABS: 40.0000 mg | ORAL_TABLET | Freq: Every day | ORAL | 0 refills | Status: DC

## 2017-04-24 NOTE — Progress Notes (Signed)
Subjective:  Patient ID: Michael Cervantes, male    DOB: [birth year]  Age: 71 y.o. MRN: [id number]  CC: Follow-up (pt here today for routine follow up of his chronic medical conditions, no other concerns voiced.)   HPI [place] presents for Declined gabapentin based on potential side effects. Pain is tolerable.No recent gout attack.  Taking the allopurinol daily. Last flare in toe was 1 year ago. Polycystic kdney dx causes moderate AM pain on awakenening. Relieved by activity.   Patient in for follow-up of elevated cholesterol. Doing well without complaints on current medication. Denies side effects of statin including myalgia and arthralgia and nausea. Also in today for liver function testing. Currently no chest pain, shortness of breath or other cardiovascular related symptoms noted.  Depression screen [facility] 2/9 04/24/2017 03/25/2017 10/21/2016  Decreased Interest 0 0 0  Down, Depressed, Hopeless 0 0 0  PHQ - 2 Score 0 0 0    History Harlon has a past medical history of Anemia; Arthritis; Bulging lumbar disc; Cellulitis (08/2014); Gout; Hyperlipidemia; OA (osteoarthritis) of foot; Polycystic kidney disease; and Sciatica (2016).   He has a past surgical history that includes Cyst excision and Tonsillectomy.   His family history includes Alzheimer's disease in his mother; Arthritis in his sister; Cancer in his father; Dementia in his mother.He reports that he quit smoking about 27 years ago. He has a 35.00 pack-year smoking history. His smokeless tobacco use includes Snuff. He reports that he does not drink alcohol or use drugs.    ROS Review of Systems  Constitutional: Negative for chills, diaphoresis, fever and unexpected weight change.  HENT: Negative for congestion, hearing loss, rhinorrhea and sore throat.   Eyes: Negative for visual disturbance.  Respiratory: Negative for cough and shortness of breath.   Cardiovascular: Negative for chest pain.  Gastrointestinal: Negative for  abdominal pain, constipation and diarrhea.  Genitourinary: Negative for dysuria and flank pain.  Musculoskeletal: Positive for back pain. Negative for arthralgias and joint swelling.  Skin: Negative for rash.  Neurological: Negative for dizziness and headaches.  Psychiatric/Behavioral: Negative for dysphoric mood and sleep disturbance.    Objective:  BP (!) 106/57   Pulse 66   Temp 97.8 F (36.6 C) (Oral)   Ht 5' 9.5" (1.765 m)   Wt 182 lb (82.6 kg)   BMI 26.49 kg/m   BP Readings from Last 3 Encounters:  04/24/17 (!) 106/57  03/25/17 112/66  10/21/16 125/80    Wt Readings from Last 3 Encounters:  04/24/17 182 lb (82.6 kg)  03/25/17 180 lb (81.6 kg)  10/21/16 191 lb (86.6 kg)     Physical Exam  Constitutional: He is oriented to person, place, and time. He appears well-developed and well-nourished. No distress.  HENT:  Head: Normocephalic and atraumatic.  Right Ear: External ear normal.  Left Ear: External ear normal.  Nose: Nose normal.  Mouth/Throat: Oropharynx is clear and moist.  Eyes: Pupils are equal, round, and reactive to light. Conjunctivae and EOM are normal.  Neck: Normal range of motion. Neck supple. No thyromegaly present.  Cardiovascular: Normal rate, regular rhythm and normal heart sounds.   No murmur heard. Pulmonary/Chest: Effort normal and breath sounds normal. No respiratory distress. He has no wheezes. He has no rales.  Abdominal: Soft. Bowel sounds are normal. He exhibits no distension. There is no tenderness.  Lymphadenopathy:    He has no cervical adenopathy.  Neurological: He is alert and oriented to person, place, and time. He has normal  reflexes.  Skin: Skin is warm and dry.  Psychiatric: He has a normal mood and affect. His behavior is normal. Judgment and thought content normal.      Assessment & Plan:   Ikechukwu was seen today for follow-up.  Diagnoses and all orders for this visit:  Mixed hyperlipidemia -     CMP14+EGFR -      Lipid panel  Anemia associated with chronic renal failure -     CBC with Differential/Platelet  Polycystic kidney disease  Chronic gout due to renal impairment involving foot without tophus, unspecified laterality -     Uric acid  Chronic kidney disease (CKD), stage III (moderate)  Vitamin D deficiency -     VITAMIN D 25 Hydroxy (Vit-D Deficiency, Fractures)  Screening for prostate cancer -     PSA, total and free  Need for immunization against influenza -     Flu Vaccine QUAD 36+ mos IM  Other orders -     allopurinol (ZYLOPRIM) 100 MG tablet; Take 2 tablets (200 mg total) by mouth daily. -     atorvastatin (LIPITOR) 40 MG tablet; Take 1 tablet (40 mg total) by mouth daily. For cholesterol -     colchicine 0.6 MG tablet; TAKE 2 TABS BY MOUTH NOW THEN 1 AN HOUR LATER, THEN 1 TWICE A DAY UNTIL COMPLETE RELIEF THEN 1 DAILY -     furosemide (LASIX) 40 MG tablet; Take 1 tablet (40 mg total) by mouth daily. -     lisinopril (PRINIVIL,ZESTRIL) 10 MG tablet; Take 1 tablet (10 mg total) by mouth daily. -     Triamcinolone Acetonide (TRIAMCINOLONE 0.1 % CREAM : EUCERIN) CREA; Apply 1 application topically 3 (three) times daily as needed.       I have discontinued Mr. Stecher gabapentin. I am also having him maintain his Multiple Vitamins-Minerals (ONE-A-DAY MENS HEALTH FORMULA PO), allopurinol, atorvastatin, colchicine, furosemide, lisinopril, and triamcinolone 0.1 % cream : eucerin.  Allergies as of 04/24/2017      Reactions   Cefaclor Rash      Medication List       Accurate as of 04/24/17 10:31 PM. Always use your most recent med list.          allopurinol 100 MG tablet Commonly known as:  ZYLOPRIM Take 2 tablets (200 mg total) by mouth daily.   atorvastatin 40 MG tablet Commonly known as:  LIPITOR Take 1 tablet (40 mg total) by mouth daily. For cholesterol   colchicine 0.6 MG tablet TAKE 2 TABS BY MOUTH NOW THEN 1 AN HOUR LATER, THEN 1 TWICE A DAY UNTIL COMPLETE  RELIEF THEN 1 DAILY   furosemide 40 MG tablet Commonly known as:  LASIX Take 1 tablet (40 mg total) by mouth daily.   lisinopril 10 MG tablet Commonly known as:  PRINIVIL,ZESTRIL Take 1 tablet (10 mg total) by mouth daily.   ONE-A-DAY MENS HEALTH FORMULA PO Take 1 tablet by mouth daily.   triamcinolone 0.1 % cream : eucerin Crea Apply 1 application topically 3 (three) times daily as needed.            Discharge Care Instructions        Start     Ordered   04/24/17 0000  CBC with Differential/Platelet     04/24/17 1111   04/24/17 0000  CMP14+EGFR     04/24/17 1111   04/24/17 0000  Lipid panel     04/24/17 1111   04/24/17 0000  PSA, total and  free     04/24/17 1111   04/24/17 0000  VITAMIN D 25 Hydroxy (Vit-D Deficiency, Fractures)     04/24/17 1111   04/24/17 0000  Uric acid     04/24/17 1111   04/24/17 0000  allopurinol (ZYLOPRIM) 100 MG tablet  Daily     04/24/17 1111   04/24/17 0000  atorvastatin (LIPITOR) 40 MG tablet  Daily    Comments:  ;   04/24/17 1111   04/24/17 0000  colchicine 0.6 MG tablet     04/24/17 1111   04/24/17 0000  furosemide (LASIX) 40 MG tablet  Daily     04/24/17 1111   04/24/17 0000  lisinopril (PRINIVIL,ZESTRIL) 10 MG tablet  Daily     04/24/17 1111   04/24/17 0000  Triamcinolone Acetonide (TRIAMCINOLONE 0.1 % CREAM : EUCERIN) CREA  3 times daily PRN     04/24/17 1111   04/24/17 0000  Flu Vaccine QUAD 36+ mos IM     04/24/17 1117       Follow-up: Return in about 6 months (around 10/22/2017).  Claretta Fraise, M.D.

## 2017-04-25 LAB — LIPID PANEL
CHOL/HDL RATIO: 3.6 ratio (ref 0.0–5.0)
CHOLESTEROL TOTAL: 145 mg/dL (ref 100–199)
HDL: 40 mg/dL (ref >39)
LDL CALC: 78 mg/dL (ref 0–99)
Triglycerides: 133 mg/dL (ref 0–149)
VLDL CHOLESTEROL CAL: 27 mg/dL (ref 5–40)

## 2017-04-25 LAB — CMP14+EGFR
ALBUMIN: 4.3 g/dL (ref 3.5–4.8)
ALK PHOS: 95 IU/L (ref 39–117)
ALT: 15 IU/L (ref 0–44)
AST: 19 IU/L (ref 0–40)
Albumin/Globulin Ratio: 2.3 — ABNORMAL HIGH (ref 1.2–2.2)
BUN / CREAT RATIO: 17 (ref 10–24)
BUN: 37 mg/dL — AB (ref 8–27)
Bilirubin Total: 0.5 mg/dL (ref 0.0–1.2)
CALCIUM: 9.8 mg/dL (ref 8.6–10.2)
CO2: 21 mmol/L (ref 20–29)
CREATININE: 2.15 mg/dL — AB (ref 0.76–1.27)
Chloride: 106 mmol/L (ref 96–106)
GFR, EST AFRICAN AMERICAN: 35 mL/min/{1.73_m2} — AB (ref >59)
GFR, EST NON AFRICAN AMERICAN: 30 mL/min/{1.73_m2} — AB (ref >59)
GLUCOSE: 92 mg/dL (ref 65–99)
Globulin, Total: 1.9 g/dL (ref 1.5–4.5)
Potassium: 4.3 mmol/L (ref 3.5–5.2)
Sodium: 142 mmol/L (ref 134–144)
TOTAL PROTEIN: 6.2 g/dL (ref 6.0–8.5)

## 2017-04-25 LAB — CBC WITH DIFFERENTIAL/PLATELET
BASOS ABS: 0 10*3/uL (ref 0.0–0.2)
Basos: 0 %
EOS (ABSOLUTE): 0.3 10*3/uL (ref 0.0–0.4)
EOS: 3 %
HEMATOCRIT: 32.9 % — AB (ref 37.5–51.0)
HEMOGLOBIN: 11.2 g/dL — AB (ref 13.0–17.7)
Immature Grans (Abs): 0 10*3/uL (ref 0.0–0.1)
Immature Granulocytes: 0 %
Lymphocytes Absolute: 2 10*3/uL (ref 0.7–3.1)
Lymphs: 23 %
MCH: 32.7 pg (ref 26.6–33.0)
MCHC: 34 g/dL (ref 31.5–35.7)
MCV: 96 fL (ref 79–97)
MONOCYTES: 7 %
Monocytes Absolute: 0.6 10*3/uL (ref 0.1–0.9)
Neutrophils Absolute: 5.8 10*3/uL (ref 1.4–7.0)
Neutrophils: 67 %
Platelets: 230 10*3/uL (ref 150–379)
RBC: 3.42 x10E6/uL — AB (ref 4.14–5.80)
RDW: 14.6 % (ref 12.3–15.4)
WBC: 8.7 10*3/uL (ref 3.4–10.8)

## 2017-04-25 LAB — PSA, TOTAL AND FREE
PROSTATE SPECIFIC AG, SERUM: 0.3 ng/mL (ref 0.0–4.0)
PSA FREE PCT: 36.7 %
PSA FREE: 0.11 ng/mL

## 2017-04-25 LAB — VITAMIN D 25 HYDROXY (VIT D DEFICIENCY, FRACTURES): Vit D, 25-Hydroxy: 46.8 ng/mL (ref 30.0–100.0)

## 2017-04-25 LAB — URIC ACID: Uric Acid: 5.7 mg/dL (ref 3.7–8.6)

## 2017-05-01 ENCOUNTER — Other Ambulatory Visit: Payer: Self-pay | Admitting: Nurse Practitioner

## 2017-05-13 DIAGNOSIS — S338XXA Sprain of other parts of lumbar spine and pelvis, initial encounter: Secondary | ICD-10-CM | POA: Diagnosis not present

## 2017-05-13 DIAGNOSIS — M47816 Spondylosis without myelopathy or radiculopathy, lumbar region: Secondary | ICD-10-CM | POA: Diagnosis not present

## 2017-05-13 DIAGNOSIS — M9903 Segmental and somatic dysfunction of lumbar region: Secondary | ICD-10-CM | POA: Diagnosis not present

## 2017-07-02 ENCOUNTER — Encounter: Payer: Self-pay | Admitting: Physician Assistant

## 2017-07-02 ENCOUNTER — Ambulatory Visit: Payer: Medicare Other | Admitting: Physician Assistant

## 2017-07-02 VITALS — BP 126/76 | HR 96 | Temp 97.7°F | Ht 69.5 in | Wt 181.4 lb

## 2017-07-02 DIAGNOSIS — J40 Bronchitis, not specified as acute or chronic: Secondary | ICD-10-CM

## 2017-07-02 MED ORDER — DOXYCYCLINE HYCLATE 100 MG PO TABS: 100.0000 mg | ORAL_TABLET | Freq: Two times a day (BID) | ORAL | 0 refills | Status: DC

## 2017-07-02 MED ORDER — BENZONATATE 100 MG PO CAPS: 100.0000 mg | ORAL_CAPSULE | Freq: Two times a day (BID) | ORAL | 0 refills | Status: DC | PRN

## 2017-07-02 MED ORDER — PREDNISONE 10 MG PO TABS: 10.0000 mg | ORAL_TABLET | Freq: Every day | ORAL | 0 refills | Status: DC

## 2017-07-02 NOTE — Progress Notes (Signed)
BP 126/76   Pulse 96   Temp 97.7 F (36.5 C) (Oral)   Ht 5' 9.5" (1.765 m)   Wt 181 lb 6.4 oz (82.3 kg)   BMI 26.40 kg/m    Subjective:    Patient ID: Michael Cervantes, male    DOB: 05-Mar-1946, 71 y.o.   MRN: [id number]  HPI: Michael Cervantes is a 71 y.o. male presenting on 07/02/2017 for Cough and Hoarse  Patient with several days of progressing upper respiratory and bronchial symptoms. Initially there was more upper respiratory congestion. This progressed to having significant cough that is productive throughout the day and severe at night. There is occasional wheezing after coughing. Sometimes there is slight dyspnea on exertion. It is productive mucus that is yellow in color. Denies any blood.   Relevant past medical, surgical, family and social history reviewed and updated as indicated. Allergies and medications reviewed and updated.  Past Medical History:  Diagnosis Date  . Anemia   . Arthritis   . Bulging lumbar disc   . Cellulitis 08/2014   rt foot  . Gout   . Hyperlipidemia   . OA (osteoarthritis) of foot    rt foot  . Polycystic kidney disease   . Sciatica 2016    Past Surgical History:  Procedure Laterality Date  . CYST EXCISION     tail bone  . TONSILLECTOMY      Review of Systems  Constitutional: Positive for fatigue. Negative for appetite change.  HENT: Positive for congestion, postnasal drip, sinus pressure and sore throat.   Eyes: Negative.  Negative for pain and visual disturbance.  Respiratory: Positive for cough, shortness of breath and wheezing. Negative for chest tightness.   Cardiovascular: Negative.  Negative for chest pain, palpitations and leg swelling.  Gastrointestinal: Negative.  Negative for abdominal pain, diarrhea, nausea and vomiting.  Endocrine: Negative.   Genitourinary: Negative.   Musculoskeletal: Negative for back pain and myalgias.  Skin: Negative.  Negative for color change and rash.  Neurological: Positive for headaches.  Negative for weakness and numbness.  Psychiatric/Behavioral: Negative.     Allergies as of 07/02/2017      Reactions   Cefaclor Rash      Medication List        Accurate as of 07/02/17  1:51 PM. Always use your most recent med list.          allopurinol 100 MG tablet Commonly known as:  ZYLOPRIM Take 2 tablets (200 mg total) by mouth daily.   atorvastatin 40 MG tablet Commonly known as:  LIPITOR Take 1 tablet (40 mg total) by mouth daily. For cholesterol   benzonatate 100 MG capsule Commonly known as:  TESSALON Take 1 capsule (100 mg total) by mouth 2 (two) times daily as needed for cough.   colchicine 0.6 MG tablet TAKE 2 TABS BY MOUTH NOW THEN 1 AN HOUR LATER, THEN 1 TWICE A DAY UNTIL COMPLETE RELIEF THEN 1 DAILY   doxycycline 100 MG tablet Commonly known as:  VIBRA-TABS Take 1 tablet (100 mg total) by mouth 2 (two) times daily. 1 po bid   furosemide 40 MG tablet Commonly known as:  LASIX Take 1 tablet (40 mg total) by mouth daily.   lisinopril 10 MG tablet Commonly known as:  PRINIVIL,ZESTRIL Take 1 tablet (10 mg total) by mouth daily.   lisinopril 10 MG tablet Commonly known as:  PRINIVIL,ZESTRIL TAKE 1 TABLET BY MOUTH EVERY DAY   ONE-A-DAY MENS HEALTH FORMULA PO Take  1 tablet by mouth daily.   predniSONE 10 MG tablet Commonly known as:  DELTASONE Take 1 tablet (10 mg total) by mouth daily with breakfast. Week 1. Then 5 mg daily for 1 week.   triamcinolone 0.1 % cream : eucerin Crea Apply 1 application topically 3 (three) times daily as needed.          Objective:    BP 126/76   Pulse 96   Temp 97.7 F (36.5 C) (Oral)   Ht 5' 9.5" (1.765 m)   Wt 181 lb 6.4 oz (82.3 kg)   BMI 26.40 kg/m   Allergies  Allergen Reactions  . Cefaclor Rash    Physical Exam  Constitutional: He appears well-developed and well-nourished.  HENT:  Head: Normocephalic and atraumatic.  Right Ear: Hearing and tympanic membrane normal.  Left Ear: Hearing and  tympanic membrane normal.  Nose: Mucosal edema and sinus tenderness present. No nasal deformity. Right sinus exhibits frontal sinus tenderness. Left sinus exhibits frontal sinus tenderness.  Mouth/Throat: Posterior oropharyngeal erythema present.  Eyes: Conjunctivae and EOM are normal. Pupils are equal, round, and reactive to light. Right eye exhibits no discharge. Left eye exhibits no discharge.  Neck: Normal range of motion. Neck supple.  Cardiovascular: Normal rate, regular rhythm and normal heart sounds.  Pulmonary/Chest: Effort normal. No respiratory distress. He has no decreased breath sounds. He has wheezes. He has no rhonchi. He has no rales.  Abdominal: Soft. Bowel sounds are normal.  Musculoskeletal: Normal range of motion.  Skin: Skin is warm and dry.        Assessment & Plan:   1. Bronchitis - doxycycline (VIBRA-TABS) 100 MG tablet; Take 1 tablet (100 mg total) by mouth 2 (two) times daily. 1 po bid  Dispense: 20 tablet; Refill: 0 - predniSONE (DELTASONE) 10 MG tablet; Take 1 tablet (10 mg total) by mouth daily with breakfast. Week 1. Then 5 mg daily for 1 week.  Dispense: 30 tablet; Refill: 0 - benzonatate (TESSALON) 100 MG capsule; Take 1 capsule (100 mg total) by mouth 2 (two) times daily as needed for cough.  Dispense: 20 capsule; Refill: 0     Current Outpatient Medications:  .  allopurinol (ZYLOPRIM) 100 MG tablet, Take 2 tablets (200 mg total) by mouth daily., Disp: 180 tablet, Rfl: 0 .  atorvastatin (LIPITOR) 40 MG tablet, Take 1 tablet (40 mg total) by mouth daily. For cholesterol, Disp: 90 tablet, Rfl: 0 .  colchicine 0.6 MG tablet, TAKE 2 TABS BY MOUTH NOW THEN 1 AN HOUR LATER, THEN 1 TWICE A DAY UNTIL COMPLETE RELIEF THEN 1 DAILY, Disp: 40 tablet, Rfl: 3 .  furosemide (LASIX) 40 MG tablet, Take 1 tablet (40 mg total) by mouth daily., Disp: 90 tablet, Rfl: 1 .  lisinopril (PRINIVIL,ZESTRIL) 10 MG tablet, Take 1 tablet (10 mg total) by mouth daily., Disp: 90 tablet,  Rfl: 1 .  lisinopril (PRINIVIL,ZESTRIL) 10 MG tablet, TAKE 1 TABLET BY MOUTH EVERY DAY, Disp: 90 tablet, Rfl: 1 .  Multiple Vitamins-Minerals (ONE-A-DAY MENS HEALTH FORMULA PO), Take 1 tablet by mouth daily., Disp: , Rfl:  .  Triamcinolone Acetonide (TRIAMCINOLONE 0.1 % CREAM : EUCERIN) CREA, Apply 1 application topically 3 (three) times daily as needed., Disp: 1 each, Rfl: 5 .  benzonatate (TESSALON) 100 MG capsule, Take 1 capsule (100 mg total) by mouth 2 (two) times daily as needed for cough., Disp: 20 capsule, Rfl: 0 .  doxycycline (VIBRA-TABS) 100 MG tablet, Take 1 tablet (100 mg total) by  mouth 2 (two) times daily. 1 po bid, Disp: 20 tablet, Rfl: 0 .  predniSONE (DELTASONE) 10 MG tablet, Take 1 tablet (10 mg total) by mouth daily with breakfast. Week 1. Then 5 mg daily for 1 week., Disp: 30 tablet, Rfl: 0 Continue all other maintenance medications as listed above.  Follow up plan: Return if symptoms worsen or fail to improve.  Educational handout given for [place] PA-C [facility] [address]  [place], [place] [address] [phone]   07/02/2017, 1:51 PM

## 2017-07-02 NOTE — Patient Instructions (Signed)
In a few days you may receive a survey in the mail or online from [employer] regarding your visit with us today. Please take a moment to fill this out. Your feedback is very important to our whole office. It can help us better understand your needs as well as improve your experience and satisfaction. Thank you for taking your time to complete it. We care about you.  Shewanda Sharpe, PA-C  

## 2017-07-08 DIAGNOSIS — M47816 Spondylosis without myelopathy or radiculopathy, lumbar region: Secondary | ICD-10-CM | POA: Diagnosis not present

## 2017-07-08 DIAGNOSIS — M9903 Segmental and somatic dysfunction of lumbar region: Secondary | ICD-10-CM | POA: Diagnosis not present

## 2017-07-08 DIAGNOSIS — S338XXA Sprain of other parts of lumbar spine and pelvis, initial encounter: Secondary | ICD-10-CM | POA: Diagnosis not present

## 2017-08-26 DIAGNOSIS — M9903 Segmental and somatic dysfunction of lumbar region: Secondary | ICD-10-CM | POA: Diagnosis not present

## 2017-08-26 DIAGNOSIS — M47816 Spondylosis without myelopathy or radiculopathy, lumbar region: Secondary | ICD-10-CM | POA: Diagnosis not present

## 2017-08-26 DIAGNOSIS — S338XXA Sprain of other parts of lumbar spine and pelvis, initial encounter: Secondary | ICD-10-CM | POA: Diagnosis not present

## 2017-09-07 DIAGNOSIS — I129 Hypertensive chronic kidney disease with stage 1 through stage 4 chronic kidney disease, or unspecified chronic kidney disease: Secondary | ICD-10-CM | POA: Diagnosis not present

## 2017-09-07 DIAGNOSIS — N2581 Secondary hyperparathyroidism of renal origin: Secondary | ICD-10-CM | POA: Insufficient documentation

## 2017-09-07 DIAGNOSIS — E785 Hyperlipidemia, unspecified: Secondary | ICD-10-CM | POA: Diagnosis not present

## 2017-09-07 DIAGNOSIS — N183 Chronic kidney disease, stage 3 (moderate): Secondary | ICD-10-CM | POA: Diagnosis not present

## 2017-09-07 DIAGNOSIS — D631 Anemia in chronic kidney disease: Secondary | ICD-10-CM | POA: Diagnosis not present

## 2017-09-10 ENCOUNTER — Other Ambulatory Visit: Payer: Self-pay | Admitting: Family Medicine

## 2017-10-21 DIAGNOSIS — M47816 Spondylosis without myelopathy or radiculopathy, lumbar region: Secondary | ICD-10-CM | POA: Diagnosis not present

## 2017-10-21 DIAGNOSIS — M9903 Segmental and somatic dysfunction of lumbar region: Secondary | ICD-10-CM | POA: Diagnosis not present

## 2017-10-21 DIAGNOSIS — S338XXA Sprain of other parts of lumbar spine and pelvis, initial encounter: Secondary | ICD-10-CM | POA: Diagnosis not present

## 2017-10-30 ENCOUNTER — Ambulatory Visit: Payer: Medicare Other | Admitting: Family Medicine

## 2017-10-30 ENCOUNTER — Encounter: Payer: Self-pay | Admitting: Family Medicine

## 2017-10-30 VITALS — BP 117/62 | HR 52 | Temp 97.1°F | Ht 69.5 in | Wt 188.0 lb

## 2017-10-30 DIAGNOSIS — H903 Sensorineural hearing loss, bilateral: Secondary | ICD-10-CM | POA: Diagnosis not present

## 2017-10-30 DIAGNOSIS — E559 Vitamin D deficiency, unspecified: Secondary | ICD-10-CM | POA: Diagnosis not present

## 2017-10-30 DIAGNOSIS — K2100 Gastro-esophageal reflux disease with esophagitis, without bleeding: Secondary | ICD-10-CM

## 2017-10-30 DIAGNOSIS — N183 Chronic kidney disease, stage 3 unspecified: Secondary | ICD-10-CM

## 2017-10-30 DIAGNOSIS — E782 Mixed hyperlipidemia: Secondary | ICD-10-CM | POA: Diagnosis not present

## 2017-10-30 DIAGNOSIS — M1A3790 Chronic gout due to renal impairment, unspecified ankle and foot, without tophus (tophi): Secondary | ICD-10-CM | POA: Diagnosis not present

## 2017-10-30 LAB — CBC WITH DIFFERENTIAL/PLATELET
BASOS ABS: 0 10*3/uL (ref 0.0–0.2)
Basos: 0 %
EOS (ABSOLUTE): 0.3 10*3/uL (ref 0.0–0.4)
Eos: 4 %
Hematocrit: 36 % — ABNORMAL LOW (ref 37.5–51.0)
Hemoglobin: 11.9 g/dL — ABNORMAL LOW (ref 13.0–17.7)
IMMATURE GRANULOCYTES: 0 %
Immature Grans (Abs): 0 10*3/uL (ref 0.0–0.1)
LYMPHS ABS: 1.9 10*3/uL (ref 0.7–3.1)
Lymphs: 23 %
MCH: 32.1 pg (ref 26.6–33.0)
MCHC: 33.1 g/dL (ref 31.5–35.7)
MCV: 97 fL (ref 79–97)
MONOCYTES: 9 %
MONOS ABS: 0.7 10*3/uL (ref 0.1–0.9)
Neutrophils Absolute: 5.3 10*3/uL (ref 1.4–7.0)
Neutrophils: 64 %
PLATELETS: 240 10*3/uL (ref 150–379)
RBC: 3.71 x10E6/uL — AB (ref 4.14–5.80)
RDW: 14.1 % (ref 12.3–15.4)
WBC: 8.3 10*3/uL (ref 3.4–10.8)

## 2017-10-30 LAB — CMP14+EGFR
ALT: 20 IU/L (ref 0–44)
AST: 22 IU/L (ref 0–40)
Albumin/Globulin Ratio: 1.9 (ref 1.2–2.2)
Albumin: 4.2 g/dL (ref 3.5–4.8)
Alkaline Phosphatase: 110 IU/L (ref 39–117)
BUN/Creatinine Ratio: 11 (ref 10–24)
BUN: 26 mg/dL (ref 8–27)
Bilirubin Total: 0.4 mg/dL (ref 0.0–1.2)
CALCIUM: 9.7 mg/dL (ref 8.6–10.2)
CO2: 23 mmol/L (ref 20–29)
CREATININE: 2.28 mg/dL — AB (ref 0.76–1.27)
Chloride: 103 mmol/L (ref 96–106)
GFR, EST AFRICAN AMERICAN: 32 mL/min/{1.73_m2} — AB (ref >59)
GFR, EST NON AFRICAN AMERICAN: 28 mL/min/{1.73_m2} — AB (ref >59)
GLUCOSE: 80 mg/dL (ref 65–99)
Globulin, Total: 2.2 g/dL (ref 1.5–4.5)
Potassium: 4.7 mmol/L (ref 3.5–5.2)
Sodium: 141 mmol/L (ref 134–144)
TOTAL PROTEIN: 6.4 g/dL (ref 6.0–8.5)

## 2017-10-30 LAB — LIPID PANEL
CHOL/HDL RATIO: 3.5 ratio (ref 0.0–5.0)
Cholesterol, Total: 157 mg/dL (ref 100–199)
HDL: 45 mg/dL (ref >39)
LDL Calculated: 92 mg/dL (ref 0–99)
TRIGLYCERIDES: 102 mg/dL (ref 0–149)
VLDL CHOLESTEROL CAL: 20 mg/dL (ref 5–40)

## 2017-10-30 MED ORDER — ALLOPURINOL 100 MG PO TABS: 200.0000 mg | ORAL_TABLET | Freq: Every day | ORAL | 1 refills | Status: DC

## 2017-10-30 MED ORDER — LISINOPRIL 10 MG PO TABS: 10.0000 mg | ORAL_TABLET | Freq: Every day | ORAL | 1 refills | Status: DC

## 2017-10-30 MED ORDER — FUROSEMIDE 40 MG PO TABS: 40.0000 mg | ORAL_TABLET | Freq: Every day | ORAL | 1 refills | Status: DC

## 2017-10-30 MED ORDER — ATORVASTATIN CALCIUM 40 MG PO TABS: 40.0000 mg | ORAL_TABLET | Freq: Every day | ORAL | 1 refills | Status: DC

## 2017-10-30 MED ORDER — FLUOCINONIDE 0.05 % EX SOLN: 1.0000 "application " | Freq: Two times a day (BID) | CUTANEOUS | 2 refills | Status: DC

## 2017-10-30 MED ORDER — PANTOPRAZOLE SODIUM 40 MG PO TBEC: 40.0000 mg | DELAYED_RELEASE_TABLET | Freq: Every day | ORAL | 5 refills | Status: DC

## 2017-10-30 NOTE — Progress Notes (Signed)
Subjective:  Patient ID: Michael Cervantes, male    DOB: Mar 08, 1946  Age: 72 y.o. MRN: [id number]  CC: Hyperlipidemia (pt here today for routine follow up of his chronic medical conditions)   HPI BERNIS SCHREUR presents for patient in for follow-up of elevated cholesterol. Doing well without complaints on current medication. Denies side effects of statin including myalgia and arthralgia and nausea. Also in today for liver function testing. Currently no chest pain, shortness of breath or other cardiovascular related symptoms noted.  Patient also continues to take allopurinol for hyperuricemia.  He denies any recurrence recently and says it has been quite a long time since he has had to take colchicine at all.  Patient continues to follow with nephrology for his chronic renal insufficiency and polycystic kidney disease.  He is due for renal function testing with BUN and creatinine today    Depression screen [facility] 2/9 10/30/2017 07/02/2017 04/24/2017  Decreased Interest 0 0 0  Down, Depressed, Hopeless 0 0 0  PHQ - 2 Score 0 0 0    History Jarrod has a past medical history of Anemia, Arthritis, Bulging lumbar disc, Cellulitis (08/2014), Gout, Hyperlipidemia, OA (osteoarthritis) of foot, Polycystic kidney disease, and Sciatica (2016).   He has a past surgical history that includes Cyst excision and Tonsillectomy.   His family history includes Alzheimer's disease in his mother; Arthritis in his sister; Cancer in his father; Dementia in his mother.He reports that he quit smoking about 28 years ago. He has a 35.00 pack-year smoking history. His smokeless tobacco use includes snuff. He reports that he does not drink alcohol or use drugs.    ROS Review of Systems  Constitutional: Negative.   HENT: Positive for hearing loss.   Eyes: Negative for visual disturbance.  Respiratory: Negative for cough and shortness of breath.   Cardiovascular: Negative for chest pain and leg swelling.  Gastrointestinal:  Positive for abdominal pain (Pain is described as heartburn.  Occurring more frequently recently.  Not amenable to over-the-counter medicines.). Negative for diarrhea, nausea and vomiting.  Genitourinary: Negative for difficulty urinating.  Musculoskeletal: Negative for arthralgias and myalgias.  Skin: Negative for rash.  Neurological: Negative for headaches.  Psychiatric/Behavioral: Negative for sleep disturbance.    Objective:  BP 117/62   Pulse (!) 52   Temp (!) 97.1 F (36.2 C) (Oral)   Ht 5' 9.5" (1.765 m)   Wt 188 lb (85.3 kg)   BMI 27.36 kg/m    BP Readings from Last 3 Encounters:  10/30/17 117/62  07/02/17 126/76  04/24/17 (!) 106/57    Wt Readings from Last 3 Encounters:  10/30/17 188 lb (85.3 kg)  07/02/17 181 lb 6.4 oz (82.3 kg)  04/24/17 182 lb (82.6 kg)     Physical Exam  Constitutional: He is oriented to person, place, and time. He appears well-developed and well-nourished. No distress.  HENT:  Head: Normocephalic and atraumatic.  Right Ear: External ear normal.  Left Ear: External ear normal.  Nose: Nose normal.  Mouth/Throat: Oropharynx is clear and moist.  Eyes: Pupils are equal, round, and reactive to light. Conjunctivae and EOM are normal.  Neck: Normal range of motion. Neck supple. No thyromegaly present.  Cardiovascular: Normal rate, regular rhythm and normal heart sounds.  No murmur heard. Pulmonary/Chest: Effort normal and breath sounds normal. No respiratory distress. He has no wheezes. He has no rales.  Abdominal: Soft. Bowel sounds are normal. He exhibits no distension. There is tenderness (Mild epigastrium).  Lymphadenopathy:  He has no cervical adenopathy.  Neurological: He is alert and oriented to person, place, and time. He has normal reflexes.  Skin: Skin is warm and dry.  Psychiatric: He has a normal mood and affect. His behavior is normal. Judgment and thought content normal.    Results for orders placed or performed in visit on  10/30/17  CBC with Differential/Platelet  Result Value Ref Range   WBC 8.3 3.4 - 10.8 x10E3/uL   RBC 3.71 (L) 4.14 - 5.80 x10E6/uL   Hemoglobin 11.9 (L) 13.0 - 17.7 g/dL   Hematocrit 36.0 (L) 37.5 - 51.0 %   MCV 97 79 - 97 fL   MCH 32.1 26.6 - 33.0 pg   MCHC 33.1 31.5 - 35.7 g/dL   RDW 14.1 12.3 - 15.4 %   Platelets 240 150 - 379 x10E3/uL   Neutrophils 64 Not Estab. %   Lymphs 23 Not Estab. %   Monocytes 9 Not Estab. %   Eos 4 Not Estab. %   Basos 0 Not Estab. %   Neutrophils Absolute 5.3 1.4 - 7.0 x10E3/uL   Lymphocytes Absolute 1.9 0.7 - 3.1 x10E3/uL   Monocytes Absolute 0.7 0.1 - 0.9 x10E3/uL   EOS (ABSOLUTE) 0.3 0.0 - 0.4 x10E3/uL   Basophils Absolute 0.0 0.0 - 0.2 x10E3/uL   Immature Granulocytes 0 Not Estab. %   Immature Grans (Abs) 0.0 0.0 - 0.1 x10E3/uL  CMP14+EGFR  Result Value Ref Range   Glucose 80 65 - 99 mg/dL   BUN 26 8 - 27 mg/dL   Creatinine, Ser 2.28 (H) 0.76 - 1.27 mg/dL   GFR calc non Af Amer 28 (L) >59 mL/min/1.73   GFR calc Af Amer 32 (L) >59 mL/min/1.73   BUN/Creatinine Ratio 11 10 - 24   Sodium 141 134 - 144 mmol/L   Potassium 4.7 3.5 - 5.2 mmol/L   Chloride 103 96 - 106 mmol/L   CO2 23 20 - 29 mmol/L   Calcium 9.7 8.6 - 10.2 mg/dL   Total Protein 6.4 6.0 - 8.5 g/dL   Albumin 4.2 3.5 - 4.8 g/dL   Globulin, Total 2.2 1.5 - 4.5 g/dL   Albumin/Globulin Ratio 1.9 1.2 - 2.2   Bilirubin Total 0.4 0.0 - 1.2 mg/dL   Alkaline Phosphatase 110 39 - 117 IU/L   AST 22 0 - 40 IU/L   ALT 20 0 - 44 IU/L  Lipid panel  Result Value Ref Range   Cholesterol, Total 157 100 - 199 mg/dL   Triglycerides 102 0 - 149 mg/dL   HDL 45 >39 mg/dL   VLDL Cholesterol Cal 20 5 - 40 mg/dL   LDL Calculated 92 0 - 99 mg/dL   Chol/HDL Ratio 3.5 0.0 - 5.0 ratio     Assessment & Plan:   Len was seen today for hyperlipidemia.  Diagnoses and all orders for this visit:  Mixed hyperlipidemia -     CBC with Differential/Platelet -     CMP14+EGFR -     Lipid panel -      atorvastatin (LIPITOR) 40 MG tablet; Take 1 tablet (40 mg total) by mouth daily. For cholesterol  Chronic gout due to renal impairment involving foot without tophus, unspecified laterality -     allopurinol (ZYLOPRIM) 100 MG tablet; Take 2 tablets (200 mg total) by mouth daily.  Vitamin D deficiency  Chronic kidney disease (CKD), stage III (moderate) (HCC) -     furosemide (LASIX) 40 MG tablet; Take 1 tablet (40 mg  total) by mouth daily. -     lisinopril (PRINIVIL,ZESTRIL) 10 MG tablet; Take 1 tablet (10 mg total) by mouth daily.  Gastroesophageal reflux disease with esophagitis -     pantoprazole (PROTONIX) 40 MG tablet; Take 1 tablet (40 mg total) by mouth daily.  Sensorineural hearing loss (SNHL) of both ears  Other orders -     fluocinonide (LIDEX) 0.05 % external solution; Apply 1 application topically 2 (two) times daily.       I have discontinued Kai Levins. Dahm's doxycycline, predniSONE, and benzonatate. I have also changed his allopurinol. Additionally, I am having him start on pantoprazole and fluocinonide. Lastly, I am having him maintain his Multiple Vitamins-Minerals (ONE-A-DAY MENS HEALTH FORMULA PO), colchicine, triamcinolone 0.1 % cream : eucerin, atorvastatin, furosemide, and lisinopril.  Allergies as of 10/30/2017      Reactions   Cefaclor Rash      Medication List        Accurate as of 10/30/17 11:59 PM. Always use your most recent med list.          allopurinol 100 MG tablet Commonly known as:  ZYLOPRIM Take 2 tablets (200 mg total) by mouth daily.   atorvastatin 40 MG tablet Commonly known as:  LIPITOR Take 1 tablet (40 mg total) by mouth daily. For cholesterol   colchicine 0.6 MG tablet TAKE 2 TABS BY MOUTH NOW THEN 1 AN HOUR LATER, THEN 1 TWICE A DAY UNTIL COMPLETE RELIEF THEN 1 DAILY   fluocinonide 0.05 % external solution Commonly known as:  LIDEX Apply 1 application topically 2 (two) times daily.   furosemide 40 MG tablet Commonly known as:   LASIX Take 1 tablet (40 mg total) by mouth daily.   lisinopril 10 MG tablet Commonly known as:  PRINIVIL,ZESTRIL Take 1 tablet (10 mg total) by mouth daily.   ONE-A-DAY MENS HEALTH FORMULA PO Take 1 tablet by mouth daily.   pantoprazole 40 MG tablet Commonly known as:  PROTONIX Take 1 tablet (40 mg total) by mouth daily.   triamcinolone 0.1 % cream : eucerin Crea Apply 1 application topically 3 (three) times daily as needed.     Recommendation for provider due to hearing loss: Mr. Ronnell [facility] [place], [place] [phone]   Follow-up: Return in about 6 months (around 05/01/2018).  Claretta Fraise, M.D.

## 2017-10-30 NOTE — Patient Instructions (Addendum)
[name] [facility] [place], [place] [phone]  Fat and Cholesterol Restricted Diet Getting too much fat and cholesterol in your diet may cause health problems. Following this diet helps keep your fat and cholesterol at normal levels. This can keep you from getting sick. What types of fat should I choose?  Choose monosaturated and polyunsaturated fats. These are found in foods such as olive oil, canola oil, flaxseeds, walnuts, almonds, and seeds.  Eat more omega-3 fats. Good choices include salmon, mackerel, sardines, tuna, flaxseed oil, and ground flaxseeds.  Limit saturated fats. These are in animal products such as meats, butter, and cream. They can also be in plant products such as palm oil, palm kernel oil, and coconut oil.  Avoid foods with partially hydrogenated oils in them. These contain trans fats. Examples of foods that have trans fats are stick margarine, some tub margarines, cookies, crackers, and other baked goods. What general guidelines do I need to follow?  Check food labels. Look for the words "trans fat" and "saturated fat."  When preparing a meal: ? Fill half of your plate with vegetables and green salads. ? Fill one fourth of your plate with whole grains. Look for the word "whole" as the first word in the ingredient list. ? Fill one fourth of your plate with lean protein foods.  Eat more foods that have fiber, like apples, carrots, beans, peas, and barley.  Eat more home-cooked foods. Eat less at restaurants and buffets.  Limit or avoid alcohol.  Limit foods high in starch and sugar.  Limit fried foods.  Cook foods without frying them. Baking, boiling, grilling, and broiling are all great options.  Lose weight if you are overweight. Losing even a small amount of weight can help your overall health. It can also help prevent diseases such as diabetes and heart disease. What foods  can I eat? Grains Whole grains, such as whole wheat or whole grain breads, crackers, cereals, and pasta. Unsweetened oatmeal, bulgur, barley, quinoa, or brown rice. Corn or whole wheat flour tortillas. Vegetables Fresh or frozen vegetables (raw, steamed, roasted, or grilled). Green salads. Fruits All fresh, canned (in natural juice), or frozen fruits. Meat and Other Protein Products Ground beef (85% or leaner), grass-fed beef, or beef trimmed of fat. Skinless chicken or [place]. Ground chicken or [place]. Pork trimmed of fat. All fish and seafood. Eggs. Dried beans, peas, or lentils. Unsalted nuts or seeds. Unsalted canned or dry beans. Dairy Low-fat dairy products, such as skim or 1% milk, 2% or reduced-fat cheeses, low-fat ricotta or cottage cheese, or plain low-fat yogurt. Fats and Oils Tub margarines without trans fats. Light or reduced-fat mayonnaise and salad dressings. Avocado. Olive, canola, sesame, or safflower oils. Natural peanut or almond butter (choose ones without added sugar and oil). The items listed above may not be a complete list of recommended foods or beverages. Contact your dietitian for more options. What foods are not recommended? Grains White bread. White pasta. White rice. Cornbread. Bagels, pastries, and croissants. Crackers that contain trans fat. Vegetables White potatoes. Corn. Creamed or fried vegetables. Vegetables in a cheese sauce. Fruits Dried fruits. Canned fruit in light or heavy syrup. Fruit juice. Meat and Other Protein Products Fatty cuts of meat. Ribs, chicken wings, bacon, sausage, bologna, salami, chitterlings, fatback, hot dogs, bratwurst, and packaged luncheon meats. Liver and organ meats. Dairy Whole or 2% milk, cream, half-and-half, and cream cheese. Whole milk cheeses. Whole-fat or sweetened  yogurt. Full-fat cheeses. Nondairy creamers and whipped toppings. Processed cheese, cheese spreads, or cheese curds. Sweets and Desserts Corn syrup, sugars,  honey, and molasses. Candy. Jam and jelly. Syrup. Sweetened cereals. Cookies, pies, cakes, donuts, muffins, and ice cream. Fats and Oils Butter, stick margarine, lard, shortening, ghee, or bacon fat. Coconut, palm kernel, or palm oils. Beverages Alcohol. Sweetened drinks (such as sodas, lemonade, and fruit drinks or punches). The items listed above may not be a complete list of foods and beverages to avoid. Contact your dietitian for more information. This information is not intended to replace advice given to you by your health care provider. Make sure you discuss any questions you have with your health care provider. Document Released: 01/13/2012 Document Revised: 03/20/2016 Document Reviewed: 10/13/2013 Elsevier Interactive Patient Education  [employer].

## 2017-11-01 ENCOUNTER — Encounter: Payer: Self-pay | Admitting: Family Medicine

## 2017-11-01 DIAGNOSIS — K2100 Gastro-esophageal reflux disease with esophagitis, without bleeding: Secondary | ICD-10-CM | POA: Insufficient documentation

## 2017-11-01 DIAGNOSIS — H903 Sensorineural hearing loss, bilateral: Secondary | ICD-10-CM | POA: Insufficient documentation

## 2017-11-06 ENCOUNTER — Ambulatory Visit: Payer: Medicare Other | Admitting: Family Medicine

## 2017-11-06 ENCOUNTER — Encounter: Payer: Self-pay | Admitting: Family Medicine

## 2017-11-06 VITALS — BP 115/69 | Temp 98.3°F | Ht 69.6 in | Wt 184.0 lb

## 2017-11-06 DIAGNOSIS — M549 Dorsalgia, unspecified: Secondary | ICD-10-CM | POA: Diagnosis not present

## 2017-11-06 MED ORDER — BACLOFEN 10 MG PO TABS: 10.0000 mg | ORAL_TABLET | Freq: Every evening | ORAL | 0 refills | Status: DC | PRN

## 2017-11-06 NOTE — Progress Notes (Signed)
BP 115/69   Temp 98.3 F (36.8 C) (Oral)   Ht 5' 9.6" (1.768 m)   Wt 184 lb (83.5 kg)   BMI 26.71 kg/m    Subjective:    Patient ID: Michael Cervantes, male    DOB: [birth year], 72 y.o.   MRN: [id number]  HPI: Michael Cervantes is a 72 y.o. male presenting on 11/06/2017 for Back pain, knot on left side of back   HPI Left back Knot and pain Patient comes in complaining of a knot in his upper left back that he has noticed over the past couple weeks.  He says it is especially worse at night when he is laying on it.  He denies any radiation of the pain anywhere else.  He says he cannot recall doing anything to it that would have caused the pain but just that it has been 6 starting up more.  He has been more active outside and he does not know if that has been causing it.  He has not used anything over-the-counter for it yet.  He says it does not hurt him too bad during the day but he can feel a knot there and it is keeping him from sleeping some nights.  Relevant past medical, surgical, family and social history reviewed and updated as indicated. Interim medical history since our last visit reviewed. Allergies and medications reviewed and updated.  Review of Systems  Constitutional: Negative for chills and fever.  Respiratory: Negative for shortness of breath and wheezing.   Cardiovascular: Negative for chest pain and leg swelling.  Musculoskeletal: Positive for back pain and myalgias. Negative for arthralgias and gait problem.  Skin: Negative for rash.  All other systems reviewed and are negative.   Per HPI unless specifically indicated above   Allergies as of 11/06/2017      Reactions   Cefaclor Rash      Medication List        Accurate as of 11/06/17  2:55 PM. Always use your most recent med list.          allopurinol 100 MG tablet Commonly known as:  ZYLOPRIM Take 2 tablets (200 mg total) by mouth daily.   atorvastatin 40 MG tablet Commonly known as:  LIPITOR Take 1  tablet (40 mg total) by mouth daily. For cholesterol   baclofen 10 MG tablet Commonly known as:  LIORESAL Take 1 tablet (10 mg total) by mouth at bedtime as needed for muscle spasms.   colchicine 0.6 MG tablet TAKE 2 TABS BY MOUTH NOW THEN 1 AN HOUR LATER, THEN 1 TWICE A DAY UNTIL COMPLETE RELIEF THEN 1 DAILY   fluocinonide 0.05 % external solution Commonly known as:  LIDEX Apply 1 application topically 2 (two) times daily.   furosemide 40 MG tablet Commonly known as:  LASIX Take 1 tablet (40 mg total) by mouth daily.   lisinopril 10 MG tablet Commonly known as:  PRINIVIL,ZESTRIL Take 1 tablet (10 mg total) by mouth daily.   ONE-A-DAY MENS HEALTH FORMULA PO Take 1 tablet by mouth daily.   pantoprazole 40 MG tablet Commonly known as:  PROTONIX Take 1 tablet (40 mg total) by mouth daily.   triamcinolone 0.1 % cream : eucerin Crea Apply 1 application topically 3 (three) times daily as needed.          Objective:    BP 115/69   Temp 98.3 F (36.8 C) (Oral)   Ht 5' 9.6" (1.768 m)   Wt 184  lb (83.5 kg)   BMI 26.71 kg/m   Wt Readings from Last 3 Encounters:  11/06/17 184 lb (83.5 kg)  10/30/17 188 lb (85.3 kg)  07/02/17 181 lb 6.4 oz (82.3 kg)    Physical Exam  Constitutional: He is oriented to person, place, and time. He appears well-developed and well-nourished. No distress.  Eyes: Pupils are equal, round, and reactive to light. Conjunctivae and EOM are normal. Right eye exhibits no discharge. No scleral icterus.  Neck: Neck supple. No thyromegaly present.  Musculoskeletal: Normal range of motion. He exhibits no edema.       Thoracic back: He exhibits tenderness. He exhibits normal range of motion, no bony tenderness and no swelling.       Back:  Lymphadenopathy:    He has no cervical adenopathy.  Neurological: He is alert and oriented to person, place, and time. Coordination normal.  Skin: Skin is warm and dry. No rash noted. He is not diaphoretic.    Psychiatric: He has a normal mood and affect. His behavior is normal.  Nursing note and vitals reviewed.     Assessment & Plan:   Problem List Items Addressed This Visit    None    Visit Diagnoses    Upper back pain on left side    -  Primary   Likely muscular tightness, sent muscle relaxer and recommended tennis ball massage trick   Relevant Medications   baclofen (LIORESAL) 10 MG tablet       Follow up plan: Return if symptoms worsen or fail to improve.  Counseling provided for all of the vaccine components No orders of the defined types were placed in this encounter.   Caryl Pina, MD [facility] 11/06/2017, 2:55 PM

## 2017-12-03 ENCOUNTER — Other Ambulatory Visit: Payer: Self-pay | Admitting: Family Medicine

## 2017-12-03 DIAGNOSIS — M549 Dorsalgia, unspecified: Secondary | ICD-10-CM

## 2017-12-09 DIAGNOSIS — M9903 Segmental and somatic dysfunction of lumbar region: Secondary | ICD-10-CM | POA: Diagnosis not present

## 2017-12-09 DIAGNOSIS — S338XXA Sprain of other parts of lumbar spine and pelvis, initial encounter: Secondary | ICD-10-CM | POA: Diagnosis not present

## 2017-12-09 DIAGNOSIS — M47816 Spondylosis without myelopathy or radiculopathy, lumbar region: Secondary | ICD-10-CM | POA: Diagnosis not present

## 2018-03-08 DIAGNOSIS — I129 Hypertensive chronic kidney disease with stage 1 through stage 4 chronic kidney disease, or unspecified chronic kidney disease: Secondary | ICD-10-CM | POA: Diagnosis not present

## 2018-03-08 DIAGNOSIS — E785 Hyperlipidemia, unspecified: Secondary | ICD-10-CM | POA: Diagnosis not present

## 2018-03-08 DIAGNOSIS — N183 Chronic kidney disease, stage 3 (moderate): Secondary | ICD-10-CM | POA: Diagnosis not present

## 2018-03-08 DIAGNOSIS — N2581 Secondary hyperparathyroidism of renal origin: Secondary | ICD-10-CM | POA: Diagnosis not present

## 2018-03-08 DIAGNOSIS — D631 Anemia in chronic kidney disease: Secondary | ICD-10-CM | POA: Diagnosis not present

## 2018-03-08 DIAGNOSIS — Z881 Allergy status to other antibiotic agents status: Secondary | ICD-10-CM | POA: Diagnosis not present

## 2018-03-10 DIAGNOSIS — N183 Chronic kidney disease, stage 3 (moderate): Secondary | ICD-10-CM | POA: Diagnosis not present

## 2018-03-18 DIAGNOSIS — N183 Chronic kidney disease, stage 3 (moderate): Secondary | ICD-10-CM | POA: Diagnosis not present

## 2018-03-24 DIAGNOSIS — H40033 Anatomical narrow angle, bilateral: Secondary | ICD-10-CM | POA: Diagnosis not present

## 2018-03-24 DIAGNOSIS — H04123 Dry eye syndrome of bilateral lacrimal glands: Secondary | ICD-10-CM | POA: Diagnosis not present

## 2018-04-09 DIAGNOSIS — H04123 Dry eye syndrome of bilateral lacrimal glands: Secondary | ICD-10-CM | POA: Diagnosis not present

## 2018-04-09 DIAGNOSIS — H43811 Vitreous degeneration, right eye: Secondary | ICD-10-CM | POA: Diagnosis not present

## 2018-04-09 DIAGNOSIS — H2511 Age-related nuclear cataract, right eye: Secondary | ICD-10-CM | POA: Diagnosis not present

## 2018-04-09 DIAGNOSIS — H25811 Combined forms of age-related cataract, right eye: Secondary | ICD-10-CM | POA: Diagnosis not present

## 2018-04-09 DIAGNOSIS — H35371 Puckering of macula, right eye: Secondary | ICD-10-CM | POA: Diagnosis not present

## 2018-04-09 DIAGNOSIS — H2512 Age-related nuclear cataract, left eye: Secondary | ICD-10-CM | POA: Diagnosis not present

## 2018-04-09 DIAGNOSIS — Z961 Presence of intraocular lens: Secondary | ICD-10-CM | POA: Diagnosis not present

## 2018-04-16 DIAGNOSIS — H35371 Puckering of macula, right eye: Secondary | ICD-10-CM | POA: Diagnosis not present

## 2018-04-16 DIAGNOSIS — H43813 Vitreous degeneration, bilateral: Secondary | ICD-10-CM | POA: Diagnosis not present

## 2018-04-29 ENCOUNTER — Other Ambulatory Visit: Payer: Self-pay | Admitting: Family Medicine

## 2018-04-29 DIAGNOSIS — K2100 Gastro-esophageal reflux disease with esophagitis, without bleeding: Secondary | ICD-10-CM

## 2018-04-29 DIAGNOSIS — N183 Chronic kidney disease, stage 3 unspecified: Secondary | ICD-10-CM

## 2018-05-03 ENCOUNTER — Ambulatory Visit: Payer: Medicare Other | Admitting: Family Medicine

## 2018-05-03 ENCOUNTER — Encounter: Payer: Self-pay | Admitting: Family Medicine

## 2018-05-03 VITALS — BP 112/75 | HR 82 | Temp 97.5°F | Ht 69.6 in | Wt 180.0 lb

## 2018-05-03 DIAGNOSIS — N183 Chronic kidney disease, stage 3 unspecified: Secondary | ICD-10-CM | POA: Insufficient documentation

## 2018-05-03 DIAGNOSIS — I129 Hypertensive chronic kidney disease with stage 1 through stage 4 chronic kidney disease, or unspecified chronic kidney disease: Secondary | ICD-10-CM | POA: Insufficient documentation

## 2018-05-03 DIAGNOSIS — K2100 Gastro-esophageal reflux disease with esophagitis, without bleeding: Secondary | ICD-10-CM

## 2018-05-03 DIAGNOSIS — Z23 Encounter for immunization: Secondary | ICD-10-CM | POA: Diagnosis not present

## 2018-05-03 DIAGNOSIS — E782 Mixed hyperlipidemia: Secondary | ICD-10-CM | POA: Diagnosis not present

## 2018-05-03 DIAGNOSIS — M1A3790 Chronic gout due to renal impairment, unspecified ankle and foot, without tophus (tophi): Secondary | ICD-10-CM | POA: Diagnosis not present

## 2018-05-03 MED ORDER — TRIAMCINOLONE 0.1 % CREAM:EUCERIN CREAM 1:1: 1.0000 "application " | TOPICAL_CREAM | Freq: Three times a day (TID) | CUTANEOUS | 5 refills | Status: DC | PRN

## 2018-05-03 MED ORDER — ATORVASTATIN CALCIUM 40 MG PO TABS: 40.0000 mg | ORAL_TABLET | Freq: Every day | ORAL | 1 refills | Status: DC

## 2018-05-03 MED ORDER — COLCHICINE 0.6 MG PO TABS: ORAL_TABLET | ORAL | 3 refills | Status: DC

## 2018-05-03 MED ORDER — ALLOPURINOL 100 MG PO TABS: 200.0000 mg | ORAL_TABLET | Freq: Every day | ORAL | 1 refills | Status: DC

## 2018-05-03 MED ORDER — PANTOPRAZOLE SODIUM 40 MG PO TBEC: 40.0000 mg | DELAYED_RELEASE_TABLET | Freq: Every day | ORAL | 1 refills | Status: DC

## 2018-05-03 MED ORDER — FUROSEMIDE 40 MG PO TABS: 40.0000 mg | ORAL_TABLET | Freq: Every day | ORAL | 1 refills | Status: DC

## 2018-05-03 MED ORDER — LISINOPRIL 10 MG PO TABS: 10.0000 mg | ORAL_TABLET | Freq: Every day | ORAL | 1 refills | Status: DC

## 2018-05-03 NOTE — Progress Notes (Signed)
Subjective:  Patient ID: Michael Cervantes, male    DOB: [birth year]  Age: 72 y.o. MRN: [id number]  CC: Medical Management of Chronic Issues   HPI Michael Cervantes presents for follow-up of elevated cholesterol. Doing well without complaints on current medication. Denies side effects of statin including myalgia and arthralgia and nausea. Also in today for liver function testing. Currently no chest pain, shortness of breath or other cardiovascular related symptoms noted.  Patient in for follow-up of GERD. Currently asymptomatic taking  PPI daily. There is no chest pain or heartburn. No hematemesis and no melena. No dysphagia or choking. Onset is remote. Progression is stable. Complicating factors, none. Patient is also followed for his renal disease along with his nephrologist.  He has reverted to using furosemide as needed due to an elevated creatinine of 2.7 when taking it daily.  He was advised to use compression stockings when the medicine was discontinued.  However his swelling has been minimal recently and he is not not needing them.  Patient also states that he has not had a gout attack since his last been seen.  He continues to use allopurinol 400 mg daily as well as colchicine as needed but the PRN colchicine has not been needed. History Michael Cervantes has a past medical history of Anemia, Arthritis, Bulging lumbar disc, Cellulitis (08/2014), Gout, Hyperlipidemia, OA (osteoarthritis) of foot, Polycystic kidney disease, and Sciatica (2016).   He has a past surgical history that includes Cyst excision and Tonsillectomy.   His family history includes Alzheimer's disease in his mother; Arthritis in his sister; Cancer in his father; Dementia in his mother.He reports that he quit smoking about 28 years ago. He has a 35.00 pack-year smoking history. His smokeless tobacco use includes snuff. He reports that he does not drink alcohol or use drugs.  Current Outpatient Medications on File Prior to Visit    Medication Sig Dispense Refill  . fluocinonide (LIDEX) 0.05 % external solution Apply 1 application topically 2 (two) times daily. 60 mL 2  . Multiple Vitamins-Minerals (ONE-A-DAY MENS HEALTH FORMULA PO) Take 1 tablet by mouth daily.     No current facility-administered medications on file prior to visit.     ROS Review of Systems  Constitutional: Negative.   HENT: Negative.   Eyes: Negative for visual disturbance.  Respiratory: Negative for cough and shortness of breath.   Cardiovascular: Negative for chest pain and leg swelling.  Gastrointestinal: Negative for abdominal pain, diarrhea, nausea and vomiting.  Genitourinary: Negative for difficulty urinating.  Musculoskeletal: Negative for arthralgias and myalgias.  Skin: Negative for rash.  Neurological: Negative for headaches.  Psychiatric/Behavioral: Negative for sleep disturbance.    Objective:  BP 112/75   Pulse 82   Temp (!) 97.5 F (36.4 C) (Oral)   Ht 5' 9.6" (1.768 m)   Wt 180 lb (81.6 kg)   BMI 26.13 kg/m   BP Readings from Last 3 Encounters:  05/03/18 112/75  11/06/17 115/69  10/30/17 117/62    Wt Readings from Last 3 Encounters:  05/03/18 180 lb (81.6 kg)  11/06/17 184 lb (83.5 kg)  10/30/17 188 lb (85.3 kg)     Physical Exam  Constitutional: He is oriented to person, place, and time. He appears well-developed and well-nourished. No distress.  HENT:  Head: Normocephalic and atraumatic.  Right Ear: External ear normal.  Left Ear: External ear normal.  Nose: Nose normal.  Mouth/Throat: Oropharynx is clear and moist.  Eyes: Pupils are equal, round, and reactive to  light. Conjunctivae and EOM are normal.  Neck: Normal range of motion. Neck supple.  Cardiovascular: Normal rate, regular rhythm and normal heart sounds.  No murmur heard. Pulmonary/Chest: Effort normal and breath sounds normal. No respiratory distress. He has no wheezes. He has no rales.  Abdominal: Soft. There is no tenderness.   Musculoskeletal: Normal range of motion.  Neurological: He is alert and oriented to person, place, and time. He has normal reflexes.  Skin: Skin is warm and dry.  Psychiatric: He has a normal mood and affect. His behavior is normal. Judgment and thought content normal.    No results found for: HGBA1C  Lab Results  Component Value Date   WBC 8.3 10/30/2017   HGB 11.9 (L) 10/30/2017   HCT 36.0 (L) 10/30/2017   PLT 240 10/30/2017   GLUCOSE 80 10/30/2017   CHOL 157 10/30/2017   TRIG 102 10/30/2017   HDL 45 10/30/2017   LDLCALC 92 10/30/2017   ALT 20 10/30/2017   AST 22 10/30/2017   NA 141 10/30/2017   K 4.7 10/30/2017   CL 103 10/30/2017   CREATININE 2.28 (H) 10/30/2017   BUN 26 10/30/2017   CO2 23 10/30/2017   TSH 2.050 10/11/2014    Dg Chest 2 View  Result Date: 09/12/2014 CLINICAL DATA:  72 year old male with cough and congestion for 8 days. Initial encounter. Personal history former smoker. EXAM: CHEST  2 VIEW COMPARISON:  None. FINDINGS: Upper limits of normal lung volumes. Normal cardiac size and mediastinal contours. Visualized tracheal air column is within normal limits. No pneumothorax, pulmonary edema, pleural effusion or confluent pulmonary opacity. No acute osseous abnormality identified. IMPRESSION: No acute cardiopulmonary abnormality. Electronically Signed   By: Genevie Ann M.D.   On: 09/12/2014 14:46   Dg Ankle Right Port  Result Date: 09/12/2014 CLINICAL DATA:  Right foot cellulitis initial evaluation, concern for septic arthritis right ankle joint EXAM: PORTABLE RIGHT ANKLE - 2 VIEW COMPARISON:  None. FINDINGS: No fracture or dislocation. Mild tibiotalar arthritis with small anterior osteophyte. Increased attenuation surrounding the joint suggests possibility of small to moderate joint effusion. No evidence of osteomyelitis. No evidence of erosive change involving the osseous components of the ankle joint. IMPRESSION: Probable ankle joint effusion.  No acute findings  otherwise. Electronically Signed   By: Skipper Cliche M.D.   On: 09/12/2014 19:13   Dg Foot Complete Right  Result Date: 09/12/2014 CLINICAL DATA:  Initial evaluation for right foot cellulitis, concern for septic arthritis of the ankle joint EXAM: RIGHT FOOT COMPLETE - 3+ VIEW COMPARISON:  None. FINDINGS: There appears to be a moderate ankle joint effusion. There is mild tibiotalar arthritis. No evidence of fracture or dislocation involving the bones of the right foot. No evidence of osteomyelitis or other acute findings. IMPRESSION: Ankle joint effusion.  No acute findings otherwise. Electronically Signed   By: Skipper Cliche M.D.   On: 09/12/2014 19:14    Assessment & Plan:   Michael Cervantes was seen today for medical management of chronic issues.  Diagnoses and all orders for this visit:  Mixed hyperlipidemia -     atorvastatin (LIPITOR) 40 MG tablet; Take 1 tablet (40 mg total) by mouth daily. For cholesterol -     CBC with Differential/Platelet -     CMP14+EGFR -     Lipid panel -     Uric acid -     VITAMIN D 25 Hydroxy (Vit-D Deficiency, Fractures)  Gastroesophageal reflux disease with esophagitis -  pantoprazole (PROTONIX) 40 MG tablet; Take 1 tablet (40 mg total) by mouth daily. -     CBC with Differential/Platelet -     CMP14+EGFR -     Lipid panel -     Uric acid -     VITAMIN D 25 Hydroxy (Vit-D Deficiency, Fractures)  Chronic kidney disease (CKD), stage III (moderate) (HCC) -     lisinopril (PRINIVIL,ZESTRIL) 10 MG tablet; Take 1 tablet (10 mg total) by mouth daily. -     furosemide (LASIX) 40 MG tablet; Take 1 tablet (40 mg total) by mouth daily. -     CBC with Differential/Platelet -     CMP14+EGFR -     Lipid panel -     Uric acid -     VITAMIN D 25 Hydroxy (Vit-D Deficiency, Fractures)  Chronic gout due to renal impairment involving foot without tophus, unspecified laterality -     allopurinol (ZYLOPRIM) 100 MG tablet; Take 2 tablets (200 mg total) by mouth  daily. -     CBC with Differential/Platelet -     CMP14+EGFR -     Lipid panel -     Uric acid -     VITAMIN D 25 Hydroxy (Vit-D Deficiency, Fractures)  Encounter for immunization -     Flu vaccine HIGH DOSE PF  Other orders -     Discontinue: Triamcinolone Acetonide (TRIAMCINOLONE 0.1 % CREAM : EUCERIN) CREA; Apply 1 application topically 3 (three) times daily as needed. -     colchicine 0.6 MG tablet; TAKE 2 TABS BY MOUTH NOW THEN 1 AN HOUR LATER, THEN 1 TWICE A DAY UNTIL COMPLETE RELIEF THEN 1 DAILY -     Triamcinolone Acetonide (TRIAMCINOLONE 0.1 % CREAM : EUCERIN) CREA; Apply 1 application topically 3 (three) times daily as needed. -     Pneumococcal polysaccharide vaccine 23-valent greater than or equal to 2yo subcutaneous/IM   I have discontinued Kai Levins. Escoe's baclofen. I have also changed his pantoprazole and furosemide. Additionally, I am having him maintain his Multiple Vitamins-Minerals (ONE-A-DAY MENS HEALTH FORMULA PO), fluocinonide, lisinopril, colchicine, atorvastatin, allopurinol, and triamcinolone 0.1 % cream : eucerin.  Meds ordered this encounter  Medications  . DISCONTD: Triamcinolone Acetonide (TRIAMCINOLONE 0.1 % CREAM : EUCERIN) CREA    Sig: Apply 1 application topically 3 (three) times daily as needed.    Dispense:  1 each    Refill:  5  . pantoprazole (PROTONIX) 40 MG tablet    Sig: Take 1 tablet (40 mg total) by mouth daily.    Dispense:  90 tablet    Refill:  1  . lisinopril (PRINIVIL,ZESTRIL) 10 MG tablet    Sig: Take 1 tablet (10 mg total) by mouth daily.    Dispense:  90 tablet    Refill:  1  . furosemide (LASIX) 40 MG tablet    Sig: Take 1 tablet (40 mg total) by mouth daily.    Dispense:  90 tablet    Refill:  1  . colchicine 0.6 MG tablet    Sig: TAKE 2 TABS BY MOUTH NOW THEN 1 AN HOUR LATER, THEN 1 TWICE A DAY UNTIL COMPLETE RELIEF THEN 1 DAILY    Dispense:  40 tablet    Refill:  3  . atorvastatin (LIPITOR) 40 MG tablet    Sig: Take 1  tablet (40 mg total) by mouth daily. For cholesterol    Dispense:  90 tablet    Refill:  1  . allopurinol (ZYLOPRIM)  100 MG tablet    Sig: Take 2 tablets (200 mg total) by mouth daily.    Dispense:  180 tablet    Refill:  1  . Triamcinolone Acetonide (TRIAMCINOLONE 0.1 % CREAM : EUCERIN) CREA    Sig: Apply 1 application topically 3 (three) times daily as needed.    Dispense:  1 each    Refill:  5     Follow-up: Return in about 6 months (around 11/02/2018).  Claretta Fraise, M.D.

## 2018-05-04 LAB — CBC WITH DIFFERENTIAL/PLATELET
BASOS ABS: 0 10*3/uL (ref 0.0–0.2)
Basos: 0 %
EOS (ABSOLUTE): 0.3 10*3/uL (ref 0.0–0.4)
EOS: 3 %
HEMOGLOBIN: 11.6 g/dL — AB (ref 13.0–17.7)
Hematocrit: 33.8 % — ABNORMAL LOW (ref 37.5–51.0)
Immature Grans (Abs): 0 10*3/uL (ref 0.0–0.1)
Immature Granulocytes: 0 %
LYMPHS ABS: 1.4 10*3/uL (ref 0.7–3.1)
Lymphs: 16 %
MCH: 33.1 pg — ABNORMAL HIGH (ref 26.6–33.0)
MCHC: 34.3 g/dL (ref 31.5–35.7)
MCV: 97 fL (ref 79–97)
MONOCYTES: 8 %
MONOS ABS: 0.7 10*3/uL (ref 0.1–0.9)
Neutrophils Absolute: 6.5 10*3/uL (ref 1.4–7.0)
Neutrophils: 73 %
Platelets: 263 10*3/uL (ref 150–450)
RBC: 3.5 x10E6/uL — ABNORMAL LOW (ref 4.14–5.80)
RDW: 12.9 % (ref 12.3–15.4)
WBC: 8.9 10*3/uL (ref 3.4–10.8)

## 2018-05-04 LAB — LIPID PANEL
Chol/HDL Ratio: 4.2 ratio (ref 0.0–5.0)
Cholesterol, Total: 163 mg/dL (ref 100–199)
HDL: 39 mg/dL — ABNORMAL LOW (ref >39)
LDL Calculated: 95 mg/dL (ref 0–99)
Triglycerides: 144 mg/dL (ref 0–149)
VLDL Cholesterol Cal: 29 mg/dL (ref 5–40)

## 2018-05-04 LAB — CMP14+EGFR
ALBUMIN: 4.3 g/dL (ref 3.5–4.8)
ALT: 23 IU/L (ref 0–44)
AST: 19 IU/L (ref 0–40)
Albumin/Globulin Ratio: 2.2 (ref 1.2–2.2)
Alkaline Phosphatase: 110 IU/L (ref 39–117)
BUN / CREAT RATIO: 15 (ref 10–24)
BUN: 32 mg/dL — AB (ref 8–27)
Bilirubin Total: 0.5 mg/dL (ref 0.0–1.2)
CO2: 23 mmol/L (ref 20–29)
CREATININE: 2.15 mg/dL — AB (ref 0.76–1.27)
Calcium: 9.7 mg/dL (ref 8.6–10.2)
Chloride: 103 mmol/L (ref 96–106)
GFR calc Af Amer: 34 mL/min/{1.73_m2} — ABNORMAL LOW (ref >59)
GFR calc non Af Amer: 30 mL/min/{1.73_m2} — ABNORMAL LOW (ref >59)
GLOBULIN, TOTAL: 2 g/dL (ref 1.5–4.5)
Glucose: 87 mg/dL (ref 65–99)
Potassium: 4.6 mmol/L (ref 3.5–5.2)
Sodium: 142 mmol/L (ref 134–144)
Total Protein: 6.3 g/dL (ref 6.0–8.5)

## 2018-05-04 LAB — VITAMIN D 25 HYDROXY (VIT D DEFICIENCY, FRACTURES): VIT D 25 HYDROXY: 51.1 ng/mL (ref 30.0–100.0)

## 2018-05-04 LAB — URIC ACID: Uric Acid: 6.2 mg/dL (ref 3.7–8.6)

## 2018-05-06 DIAGNOSIS — H26491 Other secondary cataract, right eye: Secondary | ICD-10-CM | POA: Diagnosis not present

## 2018-05-10 ENCOUNTER — Telehealth: Payer: Self-pay | Admitting: Family Medicine

## 2018-05-11 ENCOUNTER — Other Ambulatory Visit: Payer: Self-pay

## 2018-05-11 DIAGNOSIS — H538 Other visual disturbances: Secondary | ICD-10-CM

## 2018-05-11 NOTE — Progress Notes (Unsigned)
.  oph

## 2018-05-26 DIAGNOSIS — H35351 Cystoid macular degeneration, right eye: Secondary | ICD-10-CM | POA: Diagnosis not present

## 2018-05-26 DIAGNOSIS — Z961 Presence of intraocular lens: Secondary | ICD-10-CM | POA: Diagnosis not present

## 2018-05-31 DIAGNOSIS — H35371 Puckering of macula, right eye: Secondary | ICD-10-CM | POA: Diagnosis not present

## 2018-05-31 DIAGNOSIS — H2512 Age-related nuclear cataract, left eye: Secondary | ICD-10-CM | POA: Diagnosis not present

## 2018-05-31 DIAGNOSIS — H35041 Retinal micro-aneurysms, unspecified, right eye: Secondary | ICD-10-CM | POA: Diagnosis not present

## 2018-05-31 DIAGNOSIS — H35042 Retinal micro-aneurysms, unspecified, left eye: Secondary | ICD-10-CM | POA: Diagnosis not present

## 2018-06-03 ENCOUNTER — Encounter: Payer: Self-pay | Admitting: *Deleted

## 2018-06-16 DIAGNOSIS — H35371 Puckering of macula, right eye: Secondary | ICD-10-CM | POA: Diagnosis not present

## 2018-06-16 DIAGNOSIS — H539 Unspecified visual disturbance: Secondary | ICD-10-CM | POA: Diagnosis not present

## 2018-06-23 DIAGNOSIS — H35371 Puckering of macula, right eye: Secondary | ICD-10-CM | POA: Diagnosis not present

## 2018-07-08 DIAGNOSIS — D631 Anemia in chronic kidney disease: Secondary | ICD-10-CM | POA: Diagnosis not present

## 2018-07-08 DIAGNOSIS — N183 Chronic kidney disease, stage 3 (moderate): Secondary | ICD-10-CM | POA: Diagnosis not present

## 2018-07-08 DIAGNOSIS — I129 Hypertensive chronic kidney disease with stage 1 through stage 4 chronic kidney disease, or unspecified chronic kidney disease: Secondary | ICD-10-CM | POA: Diagnosis not present

## 2018-07-08 DIAGNOSIS — E785 Hyperlipidemia, unspecified: Secondary | ICD-10-CM | POA: Diagnosis not present

## 2018-07-22 DIAGNOSIS — J00 Acute nasopharyngitis [common cold]: Secondary | ICD-10-CM | POA: Diagnosis not present

## 2018-07-31 ENCOUNTER — Ambulatory Visit (INDEPENDENT_AMBULATORY_CARE_PROVIDER_SITE_OTHER): Payer: Medicare Other | Admitting: Family Medicine

## 2018-07-31 ENCOUNTER — Encounter: Payer: Self-pay | Admitting: Family Medicine

## 2018-07-31 VITALS — BP 102/65 | HR 65 | Temp 97.7°F | Ht 69.6 in | Wt 183.0 lb

## 2018-07-31 DIAGNOSIS — R053 Chronic cough: Secondary | ICD-10-CM

## 2018-07-31 DIAGNOSIS — R05 Cough: Secondary | ICD-10-CM | POA: Diagnosis not present

## 2018-07-31 MED ORDER — BENZONATATE 100 MG PO CAPS: 100.0000 mg | ORAL_CAPSULE | Freq: Two times a day (BID) | ORAL | 0 refills | Status: DC | PRN

## 2018-07-31 NOTE — Progress Notes (Signed)
BP 102/65 (BP Location: Left Arm)   Pulse 65   Temp 97.7 F (36.5 C) (Oral)   Ht 5' 9.6" (1.768 m)   Wt 183 lb (83 kg)   BMI 26.56 kg/m    Subjective:    Patient ID: Michael Cervantes, male    DOB: 15-Mar-1946, 73 y.o.   MRN: [id number]  HPI: Michael Cervantes is a 72 y.o. male presenting on 07/31/2018 for Cough (non-productive)   HPI Patient was getting over a cold but has had a persistent cough that will not go away.  The cold was over the holiday break and he took a Z-Pak and other medication which did help but then he has had this cough that just will not clear it does in the coughing spells and wakes him up at night.  He says he is taking some over-the-counter cough medicine without much success and he says that he has had Tessalon Perles would like to try those.  He denies any fevers or chills or shortness of breath or wheezing and he feels like his lungs are fine.  He says it is just his cough and dryness in his throat that is causing it.  Relevant past medical, surgical, family and social history reviewed and updated as indicated. Interim medical history since our last visit reviewed. Allergies and medications reviewed and updated.  Review of Systems  Constitutional: Negative for chills and fever.  HENT: Negative for congestion, ear discharge, ear pain, postnasal drip, rhinorrhea, sinus pressure, sneezing, sore throat and voice change.   Eyes: Negative for pain, discharge, redness and visual disturbance.  Respiratory: Positive for cough. Negative for shortness of breath and wheezing.   Cardiovascular: Negative for chest pain and leg swelling.  Musculoskeletal: Negative for gait problem.  Skin: Negative for rash.  All other systems reviewed and are negative.   Per HPI unless specifically indicated above   Allergies as of 07/31/2018      Reactions   Cefaclor Rash      Medication List       Accurate as of July 31, 2018 10:18 AM. Always use your most recent med list.        allopurinol 100 MG tablet Commonly known as:  ZYLOPRIM Take 2 tablets (200 mg total) by mouth daily.   atorvastatin 40 MG tablet Commonly known as:  LIPITOR Take 1 tablet (40 mg total) by mouth daily. For cholesterol   benzonatate 100 MG capsule Commonly known as:  TESSALON Take 1 capsule (100 mg total) by mouth 2 (two) times daily as needed for cough.   colchicine 0.6 MG tablet TAKE 2 TABS BY MOUTH NOW THEN 1 AN HOUR LATER, THEN 1 TWICE A DAY UNTIL COMPLETE RELIEF THEN 1 DAILY   fluocinonide 0.05 % external solution Commonly known as:  LIDEX Apply 1 application topically 2 (two) times daily.   furosemide 40 MG tablet Commonly known as:  LASIX Take 1 tablet (40 mg total) by mouth daily.   ipratropium 0.06 % nasal spray Commonly known as:  ATROVENT 2 sprays by Each Nare route Three (3) times a day.   lisinopril 10 MG tablet Commonly known as:  PRINIVIL,ZESTRIL Take 1 tablet (10 mg total) by mouth daily.   ONE-A-DAY MENS HEALTH FORMULA PO Take 1 tablet by mouth daily.   pantoprazole 40 MG tablet Commonly known as:  PROTONIX Take 1 tablet (40 mg total) by mouth daily.   triamcinolone 0.1 % cream : eucerin Crea Apply 1 application topically  3 (three) times daily as needed.          Objective:    BP 102/65 (BP Location: Left Arm)   Pulse 65   Temp 97.7 F (36.5 C) (Oral)   Ht 5' 9.6" (1.768 m)   Wt 183 lb (83 kg)   BMI 26.56 kg/m   Wt Readings from Last 3 Encounters:  07/31/18 183 lb (83 kg)  05/03/18 180 lb (81.6 kg)  11/06/17 184 lb (83.5 kg)    Physical Exam Vitals signs and nursing note reviewed.  Constitutional:      General: He is not in acute distress.    Appearance: He is well-developed. He is not diaphoretic.  HENT:     Right Ear: Tympanic membrane, ear canal and external ear normal.     Left Ear: Tympanic membrane, ear canal and external ear normal.     Nose: Mucosal edema present. No rhinorrhea.     Right Sinus: No maxillary sinus  tenderness or frontal sinus tenderness.     Left Sinus: No maxillary sinus tenderness or frontal sinus tenderness.     Mouth/Throat:     Pharynx: Uvula midline. No oropharyngeal exudate or posterior oropharyngeal erythema.     Tonsils: No tonsillar abscesses.  Eyes:     General: No scleral icterus.       Right eye: No discharge.     Conjunctiva/sclera: Conjunctivae normal.     Pupils: Pupils are equal, round, and reactive to light.  Neck:     Musculoskeletal: Neck supple.     Thyroid: No thyromegaly.  Cardiovascular:     Rate and Rhythm: Normal rate and regular rhythm.     Heart sounds: Normal heart sounds. No murmur.  Pulmonary:     Effort: Pulmonary effort is normal. No respiratory distress.     Breath sounds: Normal breath sounds. No wheezing or rales.  Musculoskeletal: Normal range of motion.  Lymphadenopathy:     Cervical: No cervical adenopathy.  Skin:    General: Skin is warm and dry.     Findings: No rash.  Neurological:     Mental Status: He is alert and oriented to person, place, and time.     Coordination: Coordination normal.  Psychiatric:        Behavior: Behavior normal.         Assessment & Plan:   Problem List Items Addressed This Visit    None    Visit Diagnoses    Persistent cough    -  Primary   Relevant Medications   benzonatate (TESSALON) 100 MG capsule       Follow up plan: Return if symptoms worsen or fail to improve.  Counseling provided for all of the vaccine components No orders of the defined types were placed in this encounter.   Caryl Pina, MD [facility] 07/31/2018, 10:18 AM

## 2018-08-25 DIAGNOSIS — H35371 Puckering of macula, right eye: Secondary | ICD-10-CM | POA: Diagnosis not present

## 2018-10-29 ENCOUNTER — Other Ambulatory Visit: Payer: Self-pay

## 2018-10-29 ENCOUNTER — Ambulatory Visit (INDEPENDENT_AMBULATORY_CARE_PROVIDER_SITE_OTHER): Payer: Medicare Other | Admitting: Family Medicine

## 2018-10-29 ENCOUNTER — Encounter: Payer: Self-pay | Admitting: Family Medicine

## 2018-10-29 DIAGNOSIS — K2100 Gastro-esophageal reflux disease with esophagitis, without bleeding: Secondary | ICD-10-CM

## 2018-10-29 DIAGNOSIS — N183 Chronic kidney disease, stage 3 unspecified: Secondary | ICD-10-CM

## 2018-10-29 DIAGNOSIS — E782 Mixed hyperlipidemia: Secondary | ICD-10-CM

## 2018-10-29 DIAGNOSIS — M1A3790 Chronic gout due to renal impairment, unspecified ankle and foot, without tophus (tophi): Secondary | ICD-10-CM

## 2018-10-29 DIAGNOSIS — I129 Hypertensive chronic kidney disease with stage 1 through stage 4 chronic kidney disease, or unspecified chronic kidney disease: Secondary | ICD-10-CM

## 2018-10-29 MED ORDER — COLCHICINE 0.6 MG PO TABS: ORAL_TABLET | ORAL | 3 refills | Status: DC

## 2018-10-29 MED ORDER — PANTOPRAZOLE SODIUM 40 MG PO TBEC: 40.0000 mg | DELAYED_RELEASE_TABLET | Freq: Every day | ORAL | 1 refills | Status: DC

## 2018-10-29 MED ORDER — FUROSEMIDE 40 MG PO TABS: 40.0000 mg | ORAL_TABLET | Freq: Every day | ORAL | 1 refills | Status: DC

## 2018-10-29 MED ORDER — LISINOPRIL 10 MG PO TABS: 10.0000 mg | ORAL_TABLET | Freq: Every day | ORAL | 1 refills | Status: DC

## 2018-10-29 MED ORDER — ATORVASTATIN CALCIUM 40 MG PO TABS: 40.0000 mg | ORAL_TABLET | Freq: Every day | ORAL | 1 refills | Status: DC

## 2018-10-29 MED ORDER — ALLOPURINOL 100 MG PO TABS: 200.0000 mg | ORAL_TABLET | Freq: Every day | ORAL | 1 refills | Status: DC

## 2018-10-29 NOTE — Progress Notes (Addendum)
Subjective:  Patient ID: Michael Cervantes, male    DOB: 1945/08/02  Age: 73 y.o. MRN: [id number]  CC: No chief complaint on file.   HPI Michael Cervantes presents for follow-up of elevated cholesterol. Doing well without complaints on current medication. Denies side effects of statin including myalgia and arthralgia and nausea. Also in today for liver function testing. Currently no chest pain, shortness of breath or other cardiovascular related symptoms noted. After he lays in bed a long time he gets some kidney pain. Gone after 5 minutes when he gets up and moving. Sleeping 7-8 hours a night.   Pt. Took vitals at home this morning (wife is a Marine scientist) T:98.4 BP: 120/75 P:63  Will get a little short of breath when he is sitting and reading. Walks 1-2 miles without DOE regularly  .   History Challen has a past medical history of Anemia, Arthritis, Bulging lumbar disc, Cellulitis (08/2014), Gout, Hyperlipidemia, OA (osteoarthritis) of foot, Polycystic kidney disease, and Sciatica (2016).   He has a past surgical history that includes Cyst excision and Tonsillectomy.   His family history includes Alzheimer's disease in his mother; Arthritis in his sister; Cancer in his father; Dementia in his mother.He reports that he quit smoking about 29 years ago. He has a 35.00 pack-year smoking history. His smokeless tobacco use includes snuff. He reports that he does not drink alcohol or use drugs.  Current Outpatient Medications on File Prior to Visit  Medication Sig Dispense Refill  . ipratropium (ATROVENT) 0.06 % nasal spray 2 sprays by Each Nare route Three (3) times a day.    . Multiple Vitamins-Minerals (ONE-A-DAY MENS HEALTH FORMULA PO) Take 1 tablet by mouth daily.    . Triamcinolone Acetonide (TRIAMCINOLONE 0.1 % CREAM : EUCERIN) CREA Apply 1 application topically 3 (three) times daily as needed. 1 each 5   No current facility-administered medications on file prior to visit.     ROS Review of  Systems  Constitutional: Negative.   HENT: Negative.   Eyes: Negative for visual disturbance.  Respiratory: Negative for cough and shortness of breath.   Cardiovascular: Negative for chest pain and leg swelling.  Gastrointestinal: Negative for abdominal pain, diarrhea, nausea and vomiting.  Genitourinary: Negative for difficulty urinating.  Musculoskeletal: Negative for arthralgias and myalgias.  Skin: Negative for rash.  Neurological: Negative for headaches.  Psychiatric/Behavioral: Negative for sleep disturbance.    Objective:  There were no vitals taken for this visit.  BP Readings from Last 3 Encounters:  07/31/18 102/65  05/03/18 112/75  11/06/17 115/69    Wt Readings from Last 3 Encounters:  07/31/18 183 lb (83 kg)  05/03/18 180 lb (81.6 kg)  11/06/17 184 lb (83.5 kg)     Physical Exam  No results found for: HGBA1C  Lab Results  Component Value Date   WBC 8.9 05/03/2018   HGB 11.6 (L) 05/03/2018   HCT 33.8 (L) 05/03/2018   PLT 263 05/03/2018   GLUCOSE 87 05/03/2018   CHOL 163 05/03/2018   TRIG 144 05/03/2018   HDL 39 (L) 05/03/2018   LDLCALC 95 05/03/2018   ALT 23 05/03/2018   AST 19 05/03/2018   NA 142 05/03/2018   K 4.6 05/03/2018   CL 103 05/03/2018   CREATININE 2.15 (H) 05/03/2018   BUN 32 (H) 05/03/2018   CO2 23 05/03/2018   TSH 2.050 10/11/2014    Dg Chest 2 View  Result Date: 09/12/2014 CLINICAL DATA:  73 year old male with cough and  congestion for 8 days. Initial encounter. Personal history former smoker. EXAM: CHEST  2 VIEW COMPARISON:  None. FINDINGS: Upper limits of normal lung volumes. Normal cardiac size and mediastinal contours. Visualized tracheal air column is within normal limits. No pneumothorax, pulmonary edema, pleural effusion or confluent pulmonary opacity. No acute osseous abnormality identified. IMPRESSION: No acute cardiopulmonary abnormality. Electronically Signed   By: Genevie Ann M.D.   On: 09/12/2014 14:46   Dg Ankle Right  Port  Result Date: 09/12/2014 CLINICAL DATA:  Right foot cellulitis initial evaluation, concern for septic arthritis right ankle joint EXAM: PORTABLE RIGHT ANKLE - 2 VIEW COMPARISON:  None. FINDINGS: No fracture or dislocation. Mild tibiotalar arthritis with small anterior osteophyte. Increased attenuation surrounding the joint suggests possibility of small to moderate joint effusion. No evidence of osteomyelitis. No evidence of erosive change involving the osseous components of the ankle joint. IMPRESSION: Probable ankle joint effusion.  No acute findings otherwise. Electronically Signed   By: Skipper Cliche M.D.   On: 09/12/2014 19:13   Dg Foot Complete Right  Result Date: 09/12/2014 CLINICAL DATA:  Initial evaluation for right foot cellulitis, concern for septic arthritis of the ankle joint EXAM: RIGHT FOOT COMPLETE - 3+ VIEW COMPARISON:  None. FINDINGS: There appears to be a moderate ankle joint effusion. There is mild tibiotalar arthritis. No evidence of fracture or dislocation involving the bones of the right foot. No evidence of osteomyelitis or other acute findings. IMPRESSION: Ankle joint effusion.  No acute findings otherwise. Electronically Signed   By: Skipper Cliche M.D.   On: 09/12/2014 19:14    Assessment & Plan:   Diagnoses and all orders for this visit:  Benign hypertension with chronic kidney disease, stage III (HCC)  Mixed hyperlipidemia -     atorvastatin (LIPITOR) 40 MG tablet; Take 1 tablet (40 mg total) by mouth daily. For cholesterol  Chronic kidney disease (CKD), stage III (moderate) (HCC) -     lisinopril (PRINIVIL,ZESTRIL) 10 MG tablet; Take 1 tablet (10 mg total) by mouth daily. -     furosemide (LASIX) 40 MG tablet; Take 1 tablet (40 mg total) by mouth daily.  Chronic gout due to renal impairment involving foot without tophus, unspecified laterality -     allopurinol (ZYLOPRIM) 100 MG tablet; Take 2 tablets (200 mg total) by mouth daily.  Gastroesophageal  reflux disease with esophagitis -     pantoprazole (PROTONIX) 40 MG tablet; Take 1 tablet (40 mg total) by mouth daily.  Other orders -     colchicine 0.6 MG tablet; TAKE 2 TABS BY MOUTH NOW THEN 1 AN HOUR LATER, THEN 1 TWICE A DAY UNTIL COMPLETE RELIEF THEN 1 DAILY   I have discontinued Kai Levins. Mangini's fluocinonide and benzonatate. I am also having him maintain his Multiple Vitamins-Minerals (ONE-A-DAY MENS HEALTH FORMULA PO), triamcinolone 0.1 % cream : eucerin, ipratropium, lisinopril, furosemide, allopurinol, atorvastatin, colchicine, and pantoprazole.  Meds ordered this encounter  Medications  . lisinopril (PRINIVIL,ZESTRIL) 10 MG tablet    Sig: Take 1 tablet (10 mg total) by mouth daily.    Dispense:  90 tablet    Refill:  1  . furosemide (LASIX) 40 MG tablet    Sig: Take 1 tablet (40 mg total) by mouth daily.    Dispense:  90 tablet    Refill:  1  . allopurinol (ZYLOPRIM) 100 MG tablet    Sig: Take 2 tablets (200 mg total) by mouth daily.    Dispense:  180 tablet  Refill:  1  . atorvastatin (LIPITOR) 40 MG tablet    Sig: Take 1 tablet (40 mg total) by mouth daily. For cholesterol    Dispense:  90 tablet    Refill:  1  . colchicine 0.6 MG tablet    Sig: TAKE 2 TABS BY MOUTH NOW THEN 1 AN HOUR LATER, THEN 1 TWICE A DAY UNTIL COMPLETE RELIEF THEN 1 DAILY    Dispense:  40 tablet    Refill:  3  . pantoprazole (PROTONIX) 40 MG tablet    Sig: Take 1 tablet (40 mg total) by mouth daily.    Dispense:  90 tablet    Refill:  1   Virtual Visit via telephone Note  I discussed the limitations, risks, security and privacy concerns of performing an evaluation and management service by telephone and the availability of in person appointments. I also discussed with the patient that there may be a patient responsible charge related to this service. The patient expressed understanding and agreed to proceed. Pt. Is at home. Dr. Livia Snellen is in his office.  Follow Up Instructions:   I  discussed the assessment and treatment plan with the patient. The patient was provided an opportunity to ask questions and all were answered. The patient agreed with the plan and demonstrated an understanding of the instructions.   The patient was advised to call back or seek an in-person evaluation if the symptoms worsen or if the condition fails to improve as anticipated.  Visit started: 9:59 Call ended:  10:15 Total minutes including chart review and phone contact time:16   Follow-up: Return in about 6 months (around 04/30/2019).  Claretta Fraise, M.D.

## 2018-11-02 ENCOUNTER — Ambulatory Visit: Payer: Medicare Other | Admitting: Family Medicine

## 2018-11-08 DIAGNOSIS — N183 Chronic kidney disease, stage 3 (moderate): Secondary | ICD-10-CM | POA: Diagnosis not present

## 2018-11-11 DIAGNOSIS — I129 Hypertensive chronic kidney disease with stage 1 through stage 4 chronic kidney disease, or unspecified chronic kidney disease: Secondary | ICD-10-CM | POA: Diagnosis not present

## 2018-11-11 DIAGNOSIS — E785 Hyperlipidemia, unspecified: Secondary | ICD-10-CM | POA: Diagnosis not present

## 2018-11-11 DIAGNOSIS — N183 Chronic kidney disease, stage 3 (moderate): Secondary | ICD-10-CM | POA: Diagnosis not present

## 2018-11-11 DIAGNOSIS — D631 Anemia in chronic kidney disease: Secondary | ICD-10-CM | POA: Diagnosis not present

## 2018-12-24 DIAGNOSIS — N183 Chronic kidney disease, stage 3 (moderate): Secondary | ICD-10-CM | POA: Diagnosis not present

## 2019-03-24 DIAGNOSIS — N2581 Secondary hyperparathyroidism of renal origin: Secondary | ICD-10-CM | POA: Diagnosis not present

## 2019-03-24 DIAGNOSIS — Z7952 Long term (current) use of systemic steroids: Secondary | ICD-10-CM | POA: Diagnosis not present

## 2019-03-24 DIAGNOSIS — Z5181 Encounter for therapeutic drug level monitoring: Secondary | ICD-10-CM | POA: Diagnosis not present

## 2019-03-24 DIAGNOSIS — N183 Chronic kidney disease, stage 3 (moderate): Secondary | ICD-10-CM | POA: Diagnosis not present

## 2019-03-24 DIAGNOSIS — Z79899 Other long term (current) drug therapy: Secondary | ICD-10-CM | POA: Diagnosis not present

## 2019-03-24 DIAGNOSIS — E785 Hyperlipidemia, unspecified: Secondary | ICD-10-CM | POA: Diagnosis not present

## 2019-03-24 DIAGNOSIS — I129 Hypertensive chronic kidney disease with stage 1 through stage 4 chronic kidney disease, or unspecified chronic kidney disease: Secondary | ICD-10-CM | POA: Diagnosis not present

## 2019-03-24 DIAGNOSIS — Z792 Long term (current) use of antibiotics: Secondary | ICD-10-CM | POA: Diagnosis not present

## 2019-04-15 DIAGNOSIS — H905 Unspecified sensorineural hearing loss: Secondary | ICD-10-CM | POA: Diagnosis not present

## 2019-04-21 ENCOUNTER — Other Ambulatory Visit: Payer: Self-pay | Admitting: Family Medicine

## 2019-04-21 DIAGNOSIS — M1A3790 Chronic gout due to renal impairment, unspecified ankle and foot, without tophus (tophi): Secondary | ICD-10-CM

## 2019-04-21 DIAGNOSIS — E782 Mixed hyperlipidemia: Secondary | ICD-10-CM

## 2019-04-21 DIAGNOSIS — N183 Chronic kidney disease, stage 3 unspecified: Secondary | ICD-10-CM

## 2019-04-21 NOTE — Telephone Encounter (Signed)
Patient aware and verbalizes understanding. 

## 2019-04-27 ENCOUNTER — Other Ambulatory Visit: Payer: Self-pay

## 2019-04-28 ENCOUNTER — Encounter: Payer: Self-pay | Admitting: Family Medicine

## 2019-04-28 ENCOUNTER — Ambulatory Visit (INDEPENDENT_AMBULATORY_CARE_PROVIDER_SITE_OTHER): Payer: Medicare Other | Admitting: Family Medicine

## 2019-04-28 VITALS — BP 115/77 | HR 62 | Temp 97.8°F | Ht 69.6 in | Wt 182.0 lb

## 2019-04-28 DIAGNOSIS — Q613 Polycystic kidney, unspecified: Secondary | ICD-10-CM

## 2019-04-28 DIAGNOSIS — N189 Chronic kidney disease, unspecified: Secondary | ICD-10-CM

## 2019-04-28 DIAGNOSIS — N185 Chronic kidney disease, stage 5: Secondary | ICD-10-CM

## 2019-04-28 DIAGNOSIS — D631 Anemia in chronic kidney disease: Secondary | ICD-10-CM

## 2019-04-28 DIAGNOSIS — K2100 Gastro-esophageal reflux disease with esophagitis, without bleeding: Secondary | ICD-10-CM

## 2019-04-28 DIAGNOSIS — Z23 Encounter for immunization: Secondary | ICD-10-CM | POA: Diagnosis not present

## 2019-04-28 DIAGNOSIS — E782 Mixed hyperlipidemia: Secondary | ICD-10-CM

## 2019-04-28 NOTE — Progress Notes (Signed)
Subjective:  Patient ID: Michael Cervantes, male    DOB: 08/09/1945  Age: 73 y.o. MRN: [id number]  CC: Hyperlipidemia (6 month follow up) and Medical Management of Chronic Issues   HPI KAMRON KENNEBREW presents for  in for follow-up of elevated cholesterol. Doing well without complaints on current medication. Denies side effects of statin including myalgia and arthralgia and nausea. Currently no chest pain, shortness of breath or other cardiovascular related symptoms noted. Patient in for follow-up of GERD. Currently asymptomatic taking  PPI daily. There is no chest pain or heartburn. No hematemesis and no melena. No dysphagia or choking. Onset is remote. Progression is stable. Complicating factors, none. Pt nearing the point of dialysis due to decline of renal function caused by his polycystic kidney disease. He is considering various options for dialysis with nephrology at [facility]. They think it will be within the year.   Depression screen [facility] 2/9 04/28/2019 07/31/2018 05/03/2018  Decreased Interest 0 0 0  Down, Depressed, Hopeless 0 0 0  PHQ - 2 Score 0 0 0    History Javien has a past medical history of Anemia, Arthritis, Bulging lumbar disc, Cellulitis (08/2014), Gout, Hyperlipidemia, OA (osteoarthritis) of foot, Polycystic kidney disease, and Sciatica (2016).   He has a past surgical history that includes Cyst excision and Tonsillectomy.   His family history includes Alzheimer's disease in his mother; Arthritis in his sister; Cancer in his father; Dementia in his mother.He reports that he quit smoking about 29 years ago. He has a 35.00 pack-year smoking history. His smokeless tobacco use includes snuff. He reports that he does not drink alcohol or use drugs.    ROS Review of Systems  Constitutional: Negative.   HENT: Negative.   Eyes: Negative for visual disturbance.  Respiratory: Negative for cough and shortness of breath.   Cardiovascular: Negative for chest pain and leg swelling.   Gastrointestinal: Negative for abdominal pain, diarrhea, nausea and vomiting.  Genitourinary: Negative for difficulty urinating.  Musculoskeletal: Negative for arthralgias and myalgias.  Skin: Negative for rash.  Neurological: Negative for headaches.  Psychiatric/Behavioral: Negative for sleep disturbance.    Objective:  BP 115/77   Pulse 62   Temp 97.8 F (36.6 C) (Temporal)   Ht 5' 9.6" (1.768 m)   Wt 182 lb (82.6 kg)   SpO2 100%   BMI 26.42 kg/m   BP Readings from Last 3 Encounters:  04/28/19 115/77  07/31/18 102/65  05/03/18 112/75    Wt Readings from Last 3 Encounters:  04/28/19 182 lb (82.6 kg)  07/31/18 183 lb (83 kg)  05/03/18 180 lb (81.6 kg)     Physical Exam Constitutional:      General: He is not in acute distress.    Appearance: He is well-developed.  HENT:     Head: Normocephalic and atraumatic.     Right Ear: External ear normal.     Left Ear: External ear normal.     Nose: Nose normal.  Eyes:     Conjunctiva/sclera: Conjunctivae normal.     Pupils: Pupils are equal, round, and reactive to light.  Neck:     Musculoskeletal: Normal range of motion and neck supple.  Cardiovascular:     Rate and Rhythm: Normal rate and regular rhythm.     Heart sounds: Normal heart sounds. No murmur.  Pulmonary:     Effort: Pulmonary effort is normal. No respiratory distress.     Breath sounds: Normal breath sounds. No wheezing or rales.  Abdominal:  Palpations: Abdomen is soft.     Tenderness: There is no abdominal tenderness.  Musculoskeletal: Normal range of motion.  Skin:    General: Skin is warm and dry.  Neurological:     Mental Status: He is alert and oriented to person, place, and time.     Deep Tendon Reflexes: Reflexes are normal and symmetric.  Psychiatric:        Behavior: Behavior normal.        Thought Content: Thought content normal.        Judgment: Judgment normal.       Assessment & Plan:   Brig was seen today for  hyperlipidemia and medical management of chronic issues.  Diagnoses and all orders for this visit:  Polycystic kidney disease  Need for immunization against influenza -     Flu Vaccine QUAD High Dose(Fluad)  Anemia associated with chronic renal failure  Mixed hyperlipidemia  Gastroesophageal reflux disease with esophagitis without hemorrhage  CKD (chronic kidney disease), symptom management only, stage 5 ([place])       I am having Kai Levins. Gaida maintain his Multiple Vitamins-Minerals (ONE-A-DAY MENS HEALTH FORMULA PO), triamcinolone 0.1 % cream : eucerin, ipratropium, lisinopril, colchicine, pantoprazole, allopurinol, furosemide, and atorvastatin.  Allergies as of 04/28/2019      Reactions   Cefaclor Rash      Medication List       Accurate as of April 28, 2019  2:47 PM. If you have any questions, ask your nurse or doctor.        allopurinol 100 MG tablet Commonly known as: ZYLOPRIM TAKE 2 TABLETS BY MOUTH EVERY DAY   atorvastatin 40 MG tablet Commonly known as: LIPITOR TAKE 1 TABLET BY MOUTH EVERY DAY FOR CHOLESTEROL   colchicine 0.6 MG tablet TAKE 2 TABS BY MOUTH NOW THEN 1 AN HOUR LATER, THEN 1 TWICE A DAY UNTIL COMPLETE RELIEF THEN 1 DAILY   furosemide 40 MG tablet Commonly known as: LASIX TAKE 1 TABLET BY MOUTH EVERY DAY   ipratropium 0.06 % nasal spray Commonly known as: ATROVENT 2 sprays by Each Nare route Three (3) times a day.   lisinopril 10 MG tablet Commonly known as: ZESTRIL Take 1 tablet (10 mg total) by mouth daily.   ONE-A-DAY MENS HEALTH FORMULA PO Take 1 tablet by mouth daily.   pantoprazole 40 MG tablet Commonly known as: PROTONIX Take 1 tablet (40 mg total) by mouth daily.   triamcinolone 0.1 % cream : eucerin Crea Apply 1 application topically 3 (three) times daily as needed.        Follow-up: Return in about 6 months (around 10/27/2019) for Wellness, hypertension.  Claretta Fraise, M.D.

## 2019-05-06 ENCOUNTER — Other Ambulatory Visit: Payer: Self-pay | Admitting: Family Medicine

## 2019-05-06 DIAGNOSIS — N183 Chronic kidney disease, stage 3 unspecified: Secondary | ICD-10-CM

## 2019-05-17 ENCOUNTER — Other Ambulatory Visit: Payer: Self-pay | Admitting: Family Medicine

## 2019-05-17 DIAGNOSIS — N183 Chronic kidney disease, stage 3 unspecified: Secondary | ICD-10-CM

## 2019-05-17 DIAGNOSIS — M1A3790 Chronic gout due to renal impairment, unspecified ankle and foot, without tophus (tophi): Secondary | ICD-10-CM

## 2019-05-17 DIAGNOSIS — E782 Mixed hyperlipidemia: Secondary | ICD-10-CM

## 2019-05-17 NOTE — Telephone Encounter (Signed)
OV 04/28/19

## 2019-05-25 ENCOUNTER — Other Ambulatory Visit: Payer: Self-pay | Admitting: Family Medicine

## 2019-05-25 DIAGNOSIS — K2100 Gastro-esophageal reflux disease with esophagitis, without bleeding: Secondary | ICD-10-CM

## 2019-06-15 ENCOUNTER — Other Ambulatory Visit: Payer: Self-pay | Admitting: Family Medicine

## 2019-06-15 DIAGNOSIS — M1A3790 Chronic gout due to renal impairment, unspecified ankle and foot, without tophus (tophi): Secondary | ICD-10-CM

## 2019-08-04 DIAGNOSIS — E785 Hyperlipidemia, unspecified: Secondary | ICD-10-CM | POA: Diagnosis not present

## 2019-08-04 DIAGNOSIS — I129 Hypertensive chronic kidney disease with stage 1 through stage 4 chronic kidney disease, or unspecified chronic kidney disease: Secondary | ICD-10-CM | POA: Diagnosis not present

## 2019-08-04 DIAGNOSIS — Z881 Allergy status to other antibiotic agents status: Secondary | ICD-10-CM | POA: Diagnosis not present

## 2019-08-04 DIAGNOSIS — N2581 Secondary hyperparathyroidism of renal origin: Secondary | ICD-10-CM | POA: Diagnosis not present

## 2019-08-04 DIAGNOSIS — N184 Chronic kidney disease, stage 4 (severe): Secondary | ICD-10-CM | POA: Diagnosis not present

## 2019-08-04 DIAGNOSIS — D631 Anemia in chronic kidney disease: Secondary | ICD-10-CM | POA: Diagnosis not present

## 2019-08-04 DIAGNOSIS — N1832 Chronic kidney disease, stage 3b: Secondary | ICD-10-CM | POA: Diagnosis not present

## 2019-08-19 ENCOUNTER — Encounter: Payer: Self-pay | Admitting: *Deleted

## 2019-10-19 ENCOUNTER — Other Ambulatory Visit: Payer: Self-pay

## 2019-10-19 ENCOUNTER — Encounter: Payer: Self-pay | Admitting: Family Medicine

## 2019-10-19 ENCOUNTER — Ambulatory Visit (INDEPENDENT_AMBULATORY_CARE_PROVIDER_SITE_OTHER): Payer: Medicare Other | Admitting: Family Medicine

## 2019-10-19 VITALS — BP 133/78 | HR 65 | Temp 98.9°F | Ht 69.6 in | Wt 195.6 lb

## 2019-10-19 DIAGNOSIS — I129 Hypertensive chronic kidney disease with stage 1 through stage 4 chronic kidney disease, or unspecified chronic kidney disease: Secondary | ICD-10-CM | POA: Diagnosis not present

## 2019-10-19 DIAGNOSIS — E782 Mixed hyperlipidemia: Secondary | ICD-10-CM | POA: Diagnosis not present

## 2019-10-19 DIAGNOSIS — N189 Chronic kidney disease, unspecified: Secondary | ICD-10-CM | POA: Diagnosis not present

## 2019-10-19 DIAGNOSIS — R058 Other specified cough: Secondary | ICD-10-CM

## 2019-10-19 DIAGNOSIS — Q613 Polycystic kidney, unspecified: Secondary | ICD-10-CM

## 2019-10-19 DIAGNOSIS — N183 Chronic kidney disease, stage 3 unspecified: Secondary | ICD-10-CM

## 2019-10-19 DIAGNOSIS — D631 Anemia in chronic kidney disease: Secondary | ICD-10-CM

## 2019-10-19 DIAGNOSIS — T464X5A Adverse effect of angiotensin-converting-enzyme inhibitors, initial encounter: Secondary | ICD-10-CM

## 2019-10-19 DIAGNOSIS — K2100 Gastro-esophageal reflux disease with esophagitis, without bleeding: Secondary | ICD-10-CM | POA: Diagnosis not present

## 2019-10-19 DIAGNOSIS — N184 Chronic kidney disease, stage 4 (severe): Secondary | ICD-10-CM

## 2019-10-19 DIAGNOSIS — R05 Cough: Secondary | ICD-10-CM

## 2019-10-19 DIAGNOSIS — M1A3790 Chronic gout due to renal impairment, unspecified ankle and foot, without tophus (tophi): Secondary | ICD-10-CM

## 2019-10-19 DIAGNOSIS — L308 Other specified dermatitis: Secondary | ICD-10-CM

## 2019-10-19 MED ORDER — LISINOPRIL 10 MG PO TABS: 10.0000 mg | ORAL_TABLET | Freq: Every day | ORAL | 1 refills | Status: DC

## 2019-10-19 MED ORDER — COLCHICINE 0.6 MG PO TABS: ORAL_TABLET | ORAL | 3 refills | Status: AC

## 2019-10-19 MED ORDER — ALLOPURINOL 100 MG PO TABS: 200.0000 mg | ORAL_TABLET | Freq: Every day | ORAL | 3 refills | Status: DC

## 2019-10-19 MED ORDER — VALSARTAN 160 MG PO TABS: 160.0000 mg | ORAL_TABLET | Freq: Every day | ORAL | 1 refills | Status: DC

## 2019-10-19 MED ORDER — TRIAMCINOLONE 0.1 % CREAM:EUCERIN CREAM 1:1: 1.0000 "application " | TOPICAL_CREAM | Freq: Three times a day (TID) | CUTANEOUS | 5 refills | Status: DC | PRN

## 2019-10-19 MED ORDER — PANTOPRAZOLE SODIUM 40 MG PO TBEC: 40.0000 mg | DELAYED_RELEASE_TABLET | Freq: Every day | ORAL | 1 refills | Status: DC

## 2019-10-19 MED ORDER — ATORVASTATIN CALCIUM 40 MG PO TABS: ORAL_TABLET | ORAL | 1 refills | Status: DC

## 2019-10-19 NOTE — Progress Notes (Signed)
Subjective:  Patient ID: Michael Cervantes, male    DOB: Nov 22, 1945  Age: 74 y.o. MRN: [id number]  CC: Follow-up   HPI AQIL GOETTING presents for  follow-up of hypertension. Patient has no history of headache chest pain or shortness of breath or recent cough. Patient also denies symptoms of TIA such as focal numbness or weakness. Patient denies side effects from medication. States taking it regularly.  Patient has a history of polycystic kidney disease.  His creatinine is running around 2-1/2.  He and his nephrologist are contemplating the possibility in the future of dialysis.  He is interested in home dialysis likely [facility].  Patient in for follow-up of GERD. Currently asymptomatic taking  PPI daily. There is no chest pain or heartburn. No hematemesis and no melena. No dysphagia or choking. Onset is remote. Progression is stable. Complicating factors, none.  Patient in for follow-up of elevated cholesterol. Doing well without complaints on current medication. Denies side effects of statin including myalgia and arthralgia and nausea. Also in today for liver function testing. Currently no chest pain, shortness of breath or other cardiovascular related symptoms noted.   History Ashad has a past medical history of Anemia, Arthritis, Bulging lumbar disc, Cellulitis (08/2014), Gout, Hyperlipidemia, OA (osteoarthritis) of foot, Polycystic kidney disease, and Sciatica (2016).   He has a past surgical history that includes Cyst excision and Tonsillectomy.   His family history includes Alzheimer's disease in his mother; Arthritis in his sister; Cancer in his father; Dementia in his mother.He reports that he quit smoking about 30 years ago. He has a 35.00 pack-year smoking history. His smokeless tobacco use includes snuff. He reports that he does not drink alcohol or use drugs.  Current Outpatient Medications on File Prior to Visit  Medication Sig Dispense Refill  . furosemide (LASIX) 40 MG tablet TAKE 1  TABLET BY MOUTH EVERY DAY 90 tablet 1  . ipratropium (ATROVENT) 0.06 % nasal spray 2 sprays by Each Nare route Three (3) times a day.    . Multiple Vitamins-Minerals (ONE-A-DAY MENS HEALTH FORMULA PO) Take 1 tablet by mouth daily.     No current facility-administered medications on file prior to visit.    ROS Review of Systems  Constitutional: Negative.   HENT: Negative.   Eyes: Negative for visual disturbance.  Respiratory: Negative for cough (Chronic hacking cough for several months or longer.  Perhaps even years.  He says there is nothing wrong with him just coughs a lot.  He has been using lisinopril for 20 years for his blood pressure himself.) and shortness of breath.   Cardiovascular: Negative for chest pain and leg swelling.  Gastrointestinal: Negative for abdominal pain, diarrhea, nausea and vomiting.  Genitourinary: Negative for difficulty urinating.  Musculoskeletal: Negative for arthralgias and myalgias.  Skin: Positive for rash (Scaly eruption noted on the back that itches for the last 2 months.  There is also some on the legs.).  Neurological: Negative for headaches.  Psychiatric/Behavioral: Negative for sleep disturbance.    Objective:  BP 133/78   Pulse 65   Temp 98.9 F (37.2 C) (Temporal)   Ht 5' 9.6" (1.768 m)   Wt 195 lb 9.6 oz (88.7 kg)   BMI 28.39 kg/m   BP Readings from Last 3 Encounters:  10/19/19 133/78  04/28/19 115/77  07/31/18 102/65    Wt Readings from Last 3 Encounters:  10/19/19 195 lb 9.6 oz (88.7 kg)  04/28/19 182 lb (82.6 kg)  07/31/18 183 lb (83 kg)  Physical Exam Constitutional:      General: He is not in acute distress.    Appearance: He is well-developed.  HENT:     Head: Normocephalic and atraumatic.     Right Ear: External ear normal.     Left Ear: External ear normal.     Nose: Nose normal.  Eyes:     Conjunctiva/sclera: Conjunctivae normal.     Pupils: Pupils are equal, round, and reactive to light.  Cardiovascular:      Rate and Rhythm: Normal rate and regular rhythm.     Heart sounds: Normal heart sounds. No murmur.  Pulmonary:     Effort: Pulmonary effort is normal. No respiratory distress.     Breath sounds: Normal breath sounds. No wheezing or rales.  Abdominal:     Palpations: Abdomen is soft.     Tenderness: There is no abdominal tenderness.  Musculoskeletal:        General: Normal range of motion.     Cervical back: Normal range of motion and neck supple.  Skin:    General: Skin is warm and dry.  Neurological:     Mental Status: He is alert and oriented to person, place, and time.     Deep Tendon Reflexes: Reflexes are normal and symmetric.  Psychiatric:        Behavior: Behavior normal.        Thought Content: Thought content normal.        Judgment: Judgment normal.       Assessment & Plan:   Butler was seen today for follow-up.  Diagnoses and all orders for this visit:  Benign hypertension with chronic kidney disease, stage III -     CBC with Differential/Platelet -     CMP14+EGFR -     valsartan (DIOVAN) 160 MG tablet; Take 1 tablet (160 mg total) by mouth daily.  Gastroesophageal reflux disease with esophagitis without hemorrhage -     CBC with Differential/Platelet -     CMP14+EGFR -     pantoprazole (PROTONIX) 40 MG tablet; Take 1 tablet (40 mg total) by mouth daily. -     Triamcinolone Acetonide (TRIAMCINOLONE 0.1 % CREAM : EUCERIN) CREA; Apply 1 application topically 3 (three) times daily as needed.  Mixed hyperlipidemia -     CBC with Differential/Platelet -     CMP14+EGFR -     Lipid panel -     atorvastatin (LIPITOR) 40 MG tablet; TAKE 1 TABLET BY MOUTH EVERY DAY FOR CHOLESTEROL  Polycystic kidney disease -     CBC with Differential/Platelet -     CMP14+EGFR  Anemia associated with chronic renal failure -     CBC with Differential/Platelet -     CMP14+EGFR  Chronic gout due to renal impairment involving foot without tophus, unspecified laterality -      CBC with Differential/Platelet -     CMP14+EGFR -     Uric acid -     allopurinol (ZYLOPRIM) 100 MG tablet; Take 2 tablets (200 mg total) by mouth daily. -     colchicine 0.6 MG tablet; TAKE 2 TABS BY MOUTH when  Gout flares up THEN 1 AN HOUR LATER, THEN 1 TWICE A DAY UNTIL COMPLETE RELIEF THEN 1 DAILY  Other eczema -     Triamcinolone Acetonide (TRIAMCINOLONE 0.1 % CREAM : EUCERIN) CREA; Apply 1 application topically 3 (three) times daily as needed.  ACE-inhibitor cough -     valsartan (DIOVAN) 160 MG tablet; Take 1 tablet (  160 mg total) by mouth daily.  Chronic renal disease, stage 4, severely decreased glomerular filtration rate between 15-29 mL/min/1.73 square meter (HCC) -     CBC with Differential/Platelet -     CMP14+EGFR  Other orders -     Discontinue: lisinopril (ZESTRIL) 10 MG tablet; Take 1 tablet (10 mg total) by mouth daily.   Allergies as of 10/19/2019      Reactions   Cefaclor Rash      Medication List       Accurate as of October 19, 2019  1:56 PM. If you have any questions, ask your nurse or doctor.        STOP taking these medications   lisinopril 10 MG tablet Commonly known as: ZESTRIL Stopped by: Claretta Fraise, MD     TAKE these medications   allopurinol 100 MG tablet Commonly known as: ZYLOPRIM Take 2 tablets (200 mg total) by mouth daily.   atorvastatin 40 MG tablet Commonly known as: LIPITOR TAKE 1 TABLET BY MOUTH EVERY DAY FOR CHOLESTEROL   colchicine 0.6 MG tablet TAKE 2 TABS BY MOUTH when  Gout flares up THEN 1 AN HOUR LATER, THEN 1 TWICE A DAY UNTIL COMPLETE RELIEF THEN 1 DAILY What changed: additional instructions Changed by: Claretta Fraise, MD   furosemide 40 MG tablet Commonly known as: LASIX TAKE 1 TABLET BY MOUTH EVERY DAY   ipratropium 0.06 % nasal spray Commonly known as: ATROVENT 2 sprays by Each Nare route Three (3) times a day.   ONE-A-DAY MENS HEALTH FORMULA PO Take 1 tablet by mouth daily.   pantoprazole 40 MG  tablet Commonly known as: PROTONIX Take 1 tablet (40 mg total) by mouth daily.   triamcinolone 0.1 % cream : eucerin Crea Apply 1 application topically 3 (three) times daily as needed.   valsartan 160 MG tablet Commonly known as: DIOVAN Take 1 tablet (160 mg total) by mouth daily. Started by: Claretta Fraise, MD       Meds ordered this encounter  Medications  . allopurinol (ZYLOPRIM) 100 MG tablet    Sig: Take 2 tablets (200 mg total) by mouth daily.    Dispense:  180 tablet    Refill:  3  . atorvastatin (LIPITOR) 40 MG tablet    Sig: TAKE 1 TABLET BY MOUTH EVERY DAY FOR CHOLESTEROL    Dispense:  90 tablet    Refill:  1  . DISCONTD: lisinopril (ZESTRIL) 10 MG tablet    Sig: Take 1 tablet (10 mg total) by mouth daily.    Dispense:  90 tablet    Refill:  1  . pantoprazole (PROTONIX) 40 MG tablet    Sig: Take 1 tablet (40 mg total) by mouth daily.    Dispense:  90 tablet    Refill:  1  . Triamcinolone Acetonide (TRIAMCINOLONE 0.1 % CREAM : EUCERIN) CREA    Sig: Apply 1 application topically 3 (three) times daily as needed.    Dispense:  1 each    Refill:  5  . colchicine 0.6 MG tablet    Sig: TAKE 2 TABS BY MOUTH when  Gout flares up THEN 1 AN HOUR LATER, THEN 1 TWICE A DAY UNTIL COMPLETE RELIEF THEN 1 DAILY    Dispense:  40 tablet    Refill:  3  . valsartan (DIOVAN) 160 MG tablet    Sig: Take 1 tablet (160 mg total) by mouth daily.    Dispense:  90 tablet    Refill:  1  Please cancel the scrip sent earlier today for lisinopril. Thanks, WS    Dialysis to be considered should his GFR falls below 10%, transplant to be considered if GFR falls below 15%.  Nephrology is leading to this aspect of his care.  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  I sent in triamcinolone and Eucerin which she is used in the past.  He will be taken off of his ACE inhibitor and placed on an ARB to see if that does not resolve the cough.  Other medicines continued as is pending lab  reports.  Follow-up: Return in about 6 months (around 04/20/2020).  Claretta Fraise, M.D.

## 2019-10-20 LAB — CMP14+EGFR
ALT: 13 IU/L (ref 0–44)
AST: 15 IU/L (ref 0–40)
Albumin/Globulin Ratio: 1.7 (ref 1.2–2.2)
Albumin: 4.1 g/dL (ref 3.7–4.7)
Alkaline Phosphatase: 115 IU/L (ref 39–117)
BUN/Creatinine Ratio: 10 (ref 10–24)
BUN: 28 mg/dL — ABNORMAL HIGH (ref 8–27)
Bilirubin Total: 0.5 mg/dL (ref 0.0–1.2)
CO2: 19 mmol/L — ABNORMAL LOW (ref 20–29)
Calcium: 9.6 mg/dL (ref 8.6–10.2)
Chloride: 109 mmol/L — ABNORMAL HIGH (ref 96–106)
Creatinine, Ser: 2.76 mg/dL — ABNORMAL HIGH (ref 0.76–1.27)
GFR calc Af Amer: 25 mL/min/{1.73_m2} — ABNORMAL LOW (ref >59)
GFR calc non Af Amer: 22 mL/min/{1.73_m2} — ABNORMAL LOW (ref >59)
Globulin, Total: 2.4 g/dL (ref 1.5–4.5)
Glucose: 92 mg/dL (ref 65–99)
Potassium: 4.8 mmol/L (ref 3.5–5.2)
Sodium: 143 mmol/L (ref 134–144)
Total Protein: 6.5 g/dL (ref 6.0–8.5)

## 2019-10-20 LAB — LIPID PANEL
Chol/HDL Ratio: 3.7 ratio (ref 0.0–5.0)
Cholesterol, Total: 162 mg/dL (ref 100–199)
HDL: 44 mg/dL
LDL Chol Calc (NIH): 91 mg/dL (ref 0–99)
Triglycerides: 153 mg/dL — ABNORMAL HIGH (ref 0–149)
VLDL Cholesterol Cal: 27 mg/dL (ref 5–40)

## 2019-10-20 LAB — CBC WITH DIFFERENTIAL/PLATELET
Basophils Absolute: 0 x10E3/uL (ref 0.0–0.2)
Basos: 0 %
EOS (ABSOLUTE): 0.3 x10E3/uL (ref 0.0–0.4)
Eos: 3 %
Hematocrit: 33 % — ABNORMAL LOW (ref 37.5–51.0)
Hemoglobin: 11.4 g/dL — ABNORMAL LOW (ref 13.0–17.7)
Immature Grans (Abs): 0 x10E3/uL (ref 0.0–0.1)
Immature Granulocytes: 0 %
Lymphocytes Absolute: 1.6 x10E3/uL (ref 0.7–3.1)
Lymphs: 16 %
MCH: 34.3 pg — ABNORMAL HIGH (ref 26.6–33.0)
MCHC: 34.5 g/dL (ref 31.5–35.7)
MCV: 99 fL — ABNORMAL HIGH (ref 79–97)
Monocytes Absolute: 1 x10E3/uL — ABNORMAL HIGH (ref 0.1–0.9)
Monocytes: 10 %
Neutrophils Absolute: 7 x10E3/uL (ref 1.4–7.0)
Neutrophils: 71 %
Platelets: 216 x10E3/uL (ref 150–450)
RBC: 3.32 x10E6/uL — ABNORMAL LOW (ref 4.14–5.80)
RDW: 13 % (ref 11.6–15.4)
WBC: 9.9 x10E3/uL (ref 3.4–10.8)

## 2019-10-20 LAB — URIC ACID: Uric Acid: 5 mg/dL (ref 3.8–8.4)

## 2019-10-27 ENCOUNTER — Ambulatory Visit: Payer: Medicare Other | Admitting: Family Medicine

## 2019-11-02 ENCOUNTER — Ambulatory Visit (INDEPENDENT_AMBULATORY_CARE_PROVIDER_SITE_OTHER): Payer: Medicare Other

## 2019-11-02 DIAGNOSIS — Z0000 Encounter for general adult medical examination without abnormal findings: Secondary | ICD-10-CM | POA: Diagnosis not present

## 2019-11-02 NOTE — Progress Notes (Signed)
MEDICARE ANNUAL WELLNESS VISIT  11/02/2019  Telephone Visit Disclaimer This Medicare AWV was conducted by telephone due to national recommendations for restrictions regarding the COVID-19 Pandemic (e.g. social distancing).  I verified, using two identifiers, that I am speaking with Michael Cervantes or their authorized healthcare agent. I discussed the limitations, risks, security, and privacy concerns of performing an evaluation and management service by telephone and the potential availability of an in-person appointment in the future. The patient expressed understanding and agreed to proceed.   Subjective:  Michael Cervantes is a 74 y.o. male patient of Stacks, Cletus Gash, MD who had a Medicare Annual Wellness Visit today via telephone. Michael Cervantes is a resident of [place] and resides with his wife. They don't have any children together but do have children from previous relationships. He has 2 children and she had 3 but only 1 is living. 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 When he is not working he enjoys reading and gardening. He does not have a regular exercise routine but he is very active at work and around the house.   Patient Care Team: Claretta Fraise, MD as PCP - General (Family Medicine) Rocco, Micheal Likens, MD as Referring Physician (Nephrology) Harlen Labs, MD as Referring Physician (Optometry)  Advanced Directives 11/02/2019 03/18/2016 11/22/2014 09/12/2014  Does Patient Have a Medical Advance Directive? No Yes No No  Type of Advance Directive - Healthcare Power of [place];Living will - -  Does patient want to make changes to medical advance directive? - No - Patient declined - -  Copy of [place] in Chart? - No - copy requested - -  Would patient like information on creating a medical advance directive? No - Patient declined - Yes - Educational materials given No - patient  declined information    Hospital Utilization Over the Past 12 Months: # of hospitalizations or ER visits: 0 # of surgeries: 1  Review of Systems    Patient reports that his overall health is unchanged compared to last year.  History obtained from chart review  Patient Reported Readings (BP, Pulse, CBG, Weight, etc) none  Pain Assessment Pain : 0-10 Pain Score: 5  Pain Type: Chronic pain Pain Location: Foot Pain Descriptors / Indicators: Throbbing Pain Onset: More than a month ago Pain Frequency: Constant     Current Medications & Allergies (verified) Allergies as of 11/02/2019      Reactions   Cefaclor Rash      Medication List       Accurate as of November 02, 2019 10:52 AM. If you have any questions, ask your nurse or doctor.        allopurinol 100 MG tablet Commonly known as: ZYLOPRIM Take 2 tablets (200 mg total) by mouth daily.   atorvastatin 40 MG tablet Commonly known as: LIPITOR TAKE 1 TABLET BY MOUTH EVERY DAY FOR CHOLESTEROL   colchicine 0.6 MG tablet TAKE 2 TABS BY MOUTH when  Gout flares up THEN 1 AN HOUR LATER, THEN 1 TWICE A DAY UNTIL COMPLETE RELIEF THEN 1 DAILY   furosemide 40 MG tablet Commonly known as: LASIX TAKE 1 TABLET BY MOUTH EVERY DAY   ipratropium 0.06 % nasal spray Commonly known as: ATROVENT 2 sprays by Each Nare route Three (3) times a day.   ONE-A-DAY MENS HEALTH FORMULA PO Take 1 tablet by mouth daily.  pantoprazole 40 MG tablet Commonly known as: PROTONIX Take 1 tablet (40 mg total) by mouth daily.   triamcinolone 0.1 % cream : eucerin Crea Apply 1 application topically 3 (three) times daily as needed.   valsartan 160 MG tablet Commonly known as: DIOVAN Take 1 tablet (160 mg total) by mouth daily.       History (reviewed): Past Medical History:  Diagnosis Date  . Anemia   . Arthritis   . Bulging lumbar disc   . Cellulitis 08/2014   rt foot  . Gout   . Hyperlipidemia   . OA (osteoarthritis) of foot    rt  foot  . Polycystic kidney disease   . Sciatica 2016   Past Surgical History:  Procedure Laterality Date  . CYST EXCISION     tail bone  . TONSILLECTOMY     Family History  Problem Relation Age of Onset  . Dementia Mother   . Alzheimer's disease Mother   . Cancer Father        lymphoma met to brain  . Arthritis Sister    Social History   Socioeconomic History  . Marital status: Married    Spouse name: Not on file  . Number of children: 2  . Years of education: Not on file  . Highest education level: Some college, no degree  Occupational History  . Occupation: Retired  Tobacco Use  . Smoking status: Former Smoker    Packs/day: 1.00    Years: 35.00    Pack years: 35.00    Quit date: 07/28/1989    Years since quitting: 30.2  . Smokeless tobacco: Current User    Types: Snuff  Substance and Sexual Activity  . Alcohol use: No    Alcohol/week: 0.0 standard drinks  . Drug use: No  . Sexual activity: Yes  Other Topics Concern  . Not on file  Social History Narrative  . Not on file   Social Determinants of Health   Financial Resource Strain:   . Difficulty of Paying Living Expenses:   Food Insecurity:   . Worried About Charity fundraiser in the Last Year:   . Arboriculturist in the Last Year:   Transportation Needs:   . Film/video editor (Medical):   Marland Kitchen Lack of Transportation (Non-Medical):   Physical Activity:   . Days of Exercise per Week:   . Minutes of Exercise per Session:   Stress:   . Feeling of Stress :   Social Connections:   . Frequency of Communication with Friends and Family:   . Frequency of Social Gatherings with Friends and Family:   . Attends Religious Services:   . Active Member of Clubs or Organizations:   . Attends Archivist Meetings:   Marland Kitchen Marital Status:     Activities of Daily Living In your present state of health, do you have any difficulty performing the following activities: 11/02/2019  Hearing? Y  Comment Wears  hearing aids  Vision? Y  Comment Wears glasses all the time  Difficulty concentrating or making decisions? N  Walking or climbing stairs? N  Doing errands, shopping? N  Preparing Food and eating ? N  Using the Toilet? N  In the past six months, have you accidently leaked urine? N  Do you have problems with loss of bowel control? N  Managing your Medications? N  Managing your Finances? N  Housekeeping or managing your Housekeeping? N  Some recent data might be hidden  Patient Education/ Literacy How often do you need to have someone help you when you read instructions, pamphlets, or other written materials from your doctor or pharmacy?: 1 - Never What is the last grade level you completed in school?: [employer]  Exercise Current Exercise Habits: The patient does not participate in regular exercise at present, Exercise limited by: None identified  Diet Patient reports consuming 3 meals a day and 2 snack(s) a day Patient reports that his primary diet is: Regular Patient reports that she does have regular access to food.   Depression Screen PHQ 2/9 Scores 11/02/2019 10/19/2019 04/28/2019 07/31/2018 05/03/2018 11/06/2017 10/30/2017  PHQ - 2 Score 0 0 0 0 0 0 0     Fall Risk Fall Risk  11/02/2019 10/19/2019 04/28/2019 07/31/2018 05/03/2018  Falls in the past year? 0 0 0 0 No  Number falls in past yr: - 0 - - -  Injury with Fall? - 0 - - -  Risk for fall due to : - No Fall Risks - - -  Follow up - Falls evaluation completed - - -     Objective:  Michael Cervantes seemed alert and oriented and he participated appropriately during our telephone visit.  Blood Pressure Weight BMI  BP Readings from Last 3 Encounters:  10/19/19 133/78  04/28/19 115/77  07/31/18 102/65   Wt Readings from Last 3 Encounters:  10/19/19 195 lb 9.6 oz (88.7 kg)  04/28/19 182 lb (82.6 kg)  07/31/18 183 lb (83 kg)   BMI Readings from Last 1 Encounters:  10/19/19 28.39 kg/m    *Unable to obtain current  vital signs, weight, and BMI due to telephone visit type  Hearing/Vision  . Dorien did not seem to have difficulty with hearing/understanding during the telephone conversation  He wears hearing aids in both ears . Reports that he has had a formal eye exam by an eye care professional within the past year. Patient wears glasses . Reports that he has had a formal hearing evaluation within the past year *Unable to fully assess hearing and vision during telephone visit type  Cognitive Function: 6CIT Screen 11/02/2019  What Year? 0 points  What month? 0 points  What time? 0 points  Count back from 20 0 points  Months in reverse 0 points  Repeat phrase 0 points  Total Score 0   (Normal:0-7, Significant for Dysfunction: >8)  Normal Cognitive Function Screening: Yes   Immunization & Health Maintenance Record Immunization History  Administered Date(s) Administered  . Fluad Quad(high Dose 65+) 04/28/2019  . Influenza, High Dose Seasonal PF 04/23/2016, 04/23/2016, 05/03/2018, 04/28/2019  . Influenza,inj,Quad PF,6+ Mos 06/18/2015, 04/24/2017  . Influenza-Unspecified 06/02/2013, 06/05/2014, 06/18/2015, 05/26/2016, 04/24/2017, 05/07/2017  . Pneumococcal Conjugate-13 10/19/2014, 10/19/2014, 06/12/2015  . Pneumococcal Polysaccharide-23 07/28/2005, 05/03/2018  . Pneumococcal-Unspecified 07/28/2005, 05/03/2018  . Tdap 12/13/2004  . Zoster 06/18/2015, 06/18/2015    Health Maintenance  Topic Date Due  . INFLUENZA VACCINE  02/26/2020  . TETANUS/TDAP  10/10/2021  . COLONOSCOPY  10/24/2025  . Hepatitis C Screening  Completed  . PNA vac Low Risk Adult  Completed       Assessment  This is a routine wellness examination for Michael Cervantes.  Health Maintenance: Due or Overdue There are no preventive care reminders to display for this patient.  Michael Cervantes does not need a referral for Community Assistance: Care Management:   no Social Work:    no Prescription Assistance:  no  Nutrition/Diabetes Education:  no  Plan:  Personalized Goals Goals Addressed            This Visit's Progress   . DIET - EAT MORE FRUITS AND VEGETABLES        Personalized Health Maintenance & Screening Recommendations    Lung Cancer Screening Recommended: no (Low Dose CT Chest recommended if Age 79-80 years, 30 pack-year currently smoking OR have quit w/in past 15 years) Hepatitis C Screening recommended: no HIV Screening recommended: no  Advanced Directives: Written information was not prepared per patient's request.  Referrals & Orders No orders of the defined types were placed in this encounter.   Follow-up Plan . Follow-up with Claretta Fraise, MD as planned     I have personally reviewed and noted the following in the patient's chart:   . Medical and social history . Use of alcohol, tobacco or illicit drugs  . Current medications and supplements . Functional ability and status . Nutritional status . Physical activity . Advanced directives . List of other physicians . Hospitalizations, surgeries, and ER visits in previous 12 months . Vitals . Screenings to include cognitive, depression, and falls . Referrals and appointments  In addition, I have reviewed and discussed with Michael Cervantes certain preventive protocols, quality metrics, and best practice recommendations. A written personalized care plan for preventive services as well as general preventive health recommendations is available and can be mailed to the patient at his request.      Rolena Infante LPN 03/02/7518

## 2019-12-05 DIAGNOSIS — E785 Hyperlipidemia, unspecified: Secondary | ICD-10-CM | POA: Diagnosis not present

## 2019-12-05 DIAGNOSIS — N184 Chronic kidney disease, stage 4 (severe): Secondary | ICD-10-CM | POA: Diagnosis not present

## 2019-12-05 DIAGNOSIS — D631 Anemia in chronic kidney disease: Secondary | ICD-10-CM | POA: Diagnosis not present

## 2019-12-05 DIAGNOSIS — N183 Chronic kidney disease, stage 3 unspecified: Secondary | ICD-10-CM | POA: Diagnosis not present

## 2019-12-05 DIAGNOSIS — I129 Hypertensive chronic kidney disease with stage 1 through stage 4 chronic kidney disease, or unspecified chronic kidney disease: Secondary | ICD-10-CM | POA: Diagnosis not present

## 2019-12-05 DIAGNOSIS — N2581 Secondary hyperparathyroidism of renal origin: Secondary | ICD-10-CM | POA: Diagnosis not present

## 2019-12-29 ENCOUNTER — Encounter: Payer: Self-pay | Admitting: *Deleted

## 2020-01-19 DIAGNOSIS — R21 Rash and other nonspecific skin eruption: Secondary | ICD-10-CM | POA: Diagnosis not present

## 2020-01-19 DIAGNOSIS — W57XXXA Bitten or stung by nonvenomous insect and other nonvenomous arthropods, initial encounter: Secondary | ICD-10-CM | POA: Diagnosis not present

## 2020-01-19 DIAGNOSIS — R5383 Other fatigue: Secondary | ICD-10-CM | POA: Diagnosis not present

## 2020-04-03 DIAGNOSIS — M79672 Pain in left foot: Secondary | ICD-10-CM | POA: Diagnosis not present

## 2020-04-03 DIAGNOSIS — M79671 Pain in right foot: Secondary | ICD-10-CM | POA: Diagnosis not present

## 2020-04-11 ENCOUNTER — Other Ambulatory Visit: Payer: Self-pay | Admitting: Family Medicine

## 2020-04-11 DIAGNOSIS — N183 Chronic kidney disease, stage 3 unspecified: Secondary | ICD-10-CM

## 2020-04-11 DIAGNOSIS — T464X5A Adverse effect of angiotensin-converting-enzyme inhibitors, initial encounter: Secondary | ICD-10-CM

## 2020-04-12 DIAGNOSIS — E785 Hyperlipidemia, unspecified: Secondary | ICD-10-CM | POA: Diagnosis not present

## 2020-04-12 DIAGNOSIS — Z79899 Other long term (current) drug therapy: Secondary | ICD-10-CM | POA: Diagnosis not present

## 2020-04-12 DIAGNOSIS — Z23 Encounter for immunization: Secondary | ICD-10-CM | POA: Diagnosis not present

## 2020-04-12 DIAGNOSIS — I129 Hypertensive chronic kidney disease with stage 1 through stage 4 chronic kidney disease, or unspecified chronic kidney disease: Secondary | ICD-10-CM | POA: Diagnosis not present

## 2020-04-12 DIAGNOSIS — N184 Chronic kidney disease, stage 4 (severe): Secondary | ICD-10-CM | POA: Diagnosis not present

## 2020-04-12 DIAGNOSIS — N2581 Secondary hyperparathyroidism of renal origin: Secondary | ICD-10-CM | POA: Diagnosis not present

## 2020-04-12 DIAGNOSIS — Z881 Allergy status to other antibiotic agents status: Secondary | ICD-10-CM | POA: Diagnosis not present

## 2020-04-12 DIAGNOSIS — N183 Chronic kidney disease, stage 3 unspecified: Secondary | ICD-10-CM | POA: Diagnosis not present

## 2020-04-23 ENCOUNTER — Ambulatory Visit: Payer: Medicare Other | Admitting: Family Medicine

## 2020-04-24 ENCOUNTER — Other Ambulatory Visit: Payer: Self-pay

## 2020-04-24 ENCOUNTER — Encounter: Payer: Self-pay | Admitting: Family Medicine

## 2020-04-24 ENCOUNTER — Ambulatory Visit (INDEPENDENT_AMBULATORY_CARE_PROVIDER_SITE_OTHER): Payer: Medicare Other | Admitting: Family Medicine

## 2020-04-24 VITALS — BP 133/81 | HR 66 | Temp 97.1°F | Resp 20 | Ht 69.6 in | Wt 186.0 lb

## 2020-04-24 DIAGNOSIS — N183 Chronic kidney disease, stage 3 unspecified: Secondary | ICD-10-CM

## 2020-04-24 DIAGNOSIS — N189 Chronic kidney disease, unspecified: Secondary | ICD-10-CM | POA: Diagnosis not present

## 2020-04-24 DIAGNOSIS — D631 Anemia in chronic kidney disease: Secondary | ICD-10-CM

## 2020-04-24 DIAGNOSIS — I129 Hypertensive chronic kidney disease with stage 1 through stage 4 chronic kidney disease, or unspecified chronic kidney disease: Secondary | ICD-10-CM | POA: Diagnosis not present

## 2020-04-24 DIAGNOSIS — N184 Chronic kidney disease, stage 4 (severe): Secondary | ICD-10-CM

## 2020-04-24 DIAGNOSIS — E782 Mixed hyperlipidemia: Secondary | ICD-10-CM | POA: Diagnosis not present

## 2020-04-24 DIAGNOSIS — Q613 Polycystic kidney, unspecified: Secondary | ICD-10-CM | POA: Diagnosis not present

## 2020-04-24 DIAGNOSIS — K2100 Gastro-esophageal reflux disease with esophagitis, without bleeding: Secondary | ICD-10-CM | POA: Diagnosis not present

## 2020-04-24 DIAGNOSIS — L308 Other specified dermatitis: Secondary | ICD-10-CM

## 2020-04-24 MED ORDER — FUROSEMIDE 40 MG PO TABS: 40.0000 mg | ORAL_TABLET | Freq: Every day | ORAL | 1 refills | Status: DC

## 2020-04-24 MED ORDER — TRIAMCINOLONE 0.1 % CREAM:EUCERIN CREAM 1:1: 1.0000 "application " | TOPICAL_CREAM | Freq: Three times a day (TID) | CUTANEOUS | 5 refills | Status: DC | PRN

## 2020-04-24 MED ORDER — ATORVASTATIN CALCIUM 40 MG PO TABS: ORAL_TABLET | ORAL | 1 refills | Status: DC

## 2020-04-24 MED ORDER — PANTOPRAZOLE SODIUM 40 MG PO TBEC: 40.0000 mg | DELAYED_RELEASE_TABLET | Freq: Every day | ORAL | 1 refills | Status: DC

## 2020-04-24 MED ORDER — VALSARTAN 160 MG PO TABS: 160.0000 mg | ORAL_TABLET | Freq: Every day | ORAL | 1 refills | Status: DC

## 2020-04-24 NOTE — Progress Notes (Signed)
Subjective:  Patient ID: Michael Cervantes, male    DOB: 1946/07/11  Age: 74 y.o. MRN: [id number]  CC: Medical Management of Chronic Issues   HPI Michael Cervantes presents for  follow-up of hypertension. Patient has no history of headache chest pain or shortness of breath or recent cough. Patient also denies symptoms of TIA such as focal numbness or weakness. Patient denies side effects from medication. States taking it regularly.  Patient is noted to have polycystic kidney disease.  He brings in blood work showing his creatinine at 2.7 which is stable.  Patient in for follow-up of elevated cholesterol. Doing well without complaints on current medication. Denies side effects of statin including myalgia and arthralgia and nausea. Also in today for liver function testing. Currently no chest pain, shortness of breath or other cardiovascular related symptoms noted. Patient in for follow-up of GERD. Currently asymptomatic taking  PPI daily. There is no chest pain or heartburn. No hematemesis and no melena. No dysphagia or choking. Onset is remote. Progression is stable. Complicating factors, none.      History Michael Cervantes has a past medical history of Anemia, Arthritis, Bulging lumbar disc, Cellulitis (08/2014), Gout, Hyperlipidemia, OA (osteoarthritis) of foot, Polycystic kidney disease, and Sciatica (2016).   He has a past surgical history that includes Cyst excision and Tonsillectomy.   His family history includes Alzheimer's disease in his mother; Arthritis in his sister; Cancer in his father; Dementia in his mother.He reports that he quit smoking about 30 years ago. He has a 35.00 pack-year smoking history. His smokeless tobacco use includes snuff. He reports that he does not drink alcohol and does not use drugs.  Current Outpatient Medications on File Prior to Visit  Medication Sig Dispense Refill   allopurinol (ZYLOPRIM) 100 MG tablet Take 2 tablets (200 mg total) by mouth daily. 180 tablet 3    colchicine 0.6 MG tablet TAKE 2 TABS BY MOUTH when  Gout flares up THEN 1 AN HOUR LATER, THEN 1 TWICE A DAY UNTIL COMPLETE RELIEF THEN 1 DAILY 40 tablet 3   Multiple Vitamins-Minerals (ONE-A-DAY MENS HEALTH FORMULA PO) Take 1 tablet by mouth daily.     ipratropium (ATROVENT) 0.06 % nasal spray 2 sprays by Each Nare route Three (3) times a day.     No current facility-administered medications on file prior to visit.    ROS Review of Systems  Constitutional: Negative for fever.  Respiratory: Negative for shortness of breath.   Cardiovascular: Negative for chest pain.  Musculoskeletal: Negative for arthralgias.  Skin: Negative for rash.    Objective:  BP 133/81    Pulse 66    Temp (!) 97.1 F (36.2 C) (Temporal)    Resp 20    Ht 5' 9.6" (1.768 m)    Wt 186 lb (84.4 kg)    SpO2 100%    BMI 27.00 kg/m   BP Readings from Last 3 Encounters:  04/24/20 133/81  10/19/19 133/78  04/28/19 115/77    Wt Readings from Last 3 Encounters:  04/24/20 186 lb (84.4 kg)  10/19/19 195 lb 9.6 oz (88.7 kg)  04/28/19 182 lb (82.6 kg)     Physical Exam Vitals reviewed.  Constitutional:      Appearance: He is well-developed.  HENT:     Head: Normocephalic and atraumatic.     Right Ear: External ear normal.     Left Ear: External ear normal.     Mouth/Throat:     Pharynx: No oropharyngeal exudate  or posterior oropharyngeal erythema.  Eyes:     Pupils: Pupils are equal, round, and reactive to light.  Cardiovascular:     Rate and Rhythm: Normal rate and regular rhythm.     Heart sounds: No murmur heard.   Pulmonary:     Effort: No respiratory distress.     Breath sounds: Normal breath sounds.  Musculoskeletal:     Cervical back: Normal range of motion and neck supple.  Neurological:     Mental Status: He is alert and oriented to person, place, and time.       Assessment & Plan:   Michael Cervantes was seen today for medical management of chronic issues.  Diagnoses and all orders for this  visit:  Polycystic kidney disease  Anemia associated with chronic renal failure  Mixed hyperlipidemia -     atorvastatin (LIPITOR) 40 MG tablet; TAKE 1 TABLET BY MOUTH EVERY DAY FOR CHOLESTEROL  Gastroesophageal reflux disease with esophagitis without hemorrhage -     pantoprazole (PROTONIX) 40 MG tablet; Take 1 tablet (40 mg total) by mouth daily. -     Triamcinolone Acetonide (TRIAMCINOLONE 0.1 % CREAM : EUCERIN) CREA; Apply 1 application topically 3 (three) times daily as needed.  Benign hypertension with chronic kidney disease, stage III -     valsartan (DIOVAN) 160 MG tablet; Take 1 tablet (160 mg total) by mouth daily.  Chronic renal insufficiency, stage 4 (severe) (HCC) -     furosemide (LASIX) 40 MG tablet; Take 1 tablet (40 mg total) by mouth daily.  Other eczema -     Triamcinolone Acetonide (TRIAMCINOLONE 0.1 % CREAM : EUCERIN) CREA; Apply 1 application topically 3 (three) times daily as needed.  Benign hypertension with chronic kidney disease, stage III (HCC) -     valsartan (DIOVAN) 160 MG tablet; Take 1 tablet (160 mg total) by mouth daily.   Allergies as of 04/24/2020      Reactions   Cefaclor Rash      Medication List       Accurate as of April 24, 2020 10:05 PM. If you have any questions, ask your nurse or doctor.        allopurinol 100 MG tablet Commonly known as: ZYLOPRIM Take 2 tablets (200 mg total) by mouth daily.   atorvastatin 40 MG tablet Commonly known as: LIPITOR TAKE 1 TABLET BY MOUTH EVERY DAY FOR CHOLESTEROL   colchicine 0.6 MG tablet TAKE 2 TABS BY MOUTH when  Gout flares up THEN 1 AN HOUR LATER, THEN 1 TWICE A DAY UNTIL COMPLETE RELIEF THEN 1 DAILY   furosemide 40 MG tablet Commonly known as: LASIX Take 1 tablet (40 mg total) by mouth daily.   ipratropium 0.06 % nasal spray Commonly known as: ATROVENT 2 sprays by Each Nare route Three (3) times a day.   ONE-A-DAY MENS HEALTH FORMULA PO Take 1 tablet by mouth daily.     pantoprazole 40 MG tablet Commonly known as: PROTONIX Take 1 tablet (40 mg total) by mouth daily.   triamcinolone 0.1 % cream : eucerin Crea Apply 1 application topically 3 (three) times daily as needed.   valsartan 160 MG tablet Commonly known as: DIOVAN Take 1 tablet (160 mg total) by mouth daily.       Meds ordered this encounter  Medications   atorvastatin (LIPITOR) 40 MG tablet    Sig: TAKE 1 TABLET BY MOUTH EVERY DAY FOR CHOLESTEROL    Dispense:  90 tablet    Refill:  1  furosemide (LASIX) 40 MG tablet    Sig: Take 1 tablet (40 mg total) by mouth daily.    Dispense:  90 tablet    Refill:  1   pantoprazole (PROTONIX) 40 MG tablet    Sig: Take 1 tablet (40 mg total) by mouth daily.    Dispense:  90 tablet    Refill:  1   Triamcinolone Acetonide (TRIAMCINOLONE 0.1 % CREAM : EUCERIN) CREA    Sig: Apply 1 application topically 3 (three) times daily as needed.    Dispense:  1 each    Refill:  5   valsartan (DIOVAN) 160 MG tablet    Sig: Take 1 tablet (160 mg total) by mouth daily.    Dispense:  90 tablet    Refill:  1    Pt. States his nephrologist is looking at putting him on the transplant list for kidney.  Follow-up: Return in about 6 months (around 10/22/2020), or if symptoms worsen or fail to improve.  Claretta Fraise, M.D.

## 2020-06-13 ENCOUNTER — Ambulatory Visit: Payer: Medicare Other

## 2020-06-14 DIAGNOSIS — M722 Plantar fascial fibromatosis: Secondary | ICD-10-CM | POA: Diagnosis not present

## 2020-06-14 DIAGNOSIS — M79672 Pain in left foot: Secondary | ICD-10-CM | POA: Diagnosis not present

## 2020-06-20 ENCOUNTER — Other Ambulatory Visit: Payer: Self-pay

## 2020-06-20 ENCOUNTER — Ambulatory Visit (INDEPENDENT_AMBULATORY_CARE_PROVIDER_SITE_OTHER): Payer: Medicare Other

## 2020-06-20 DIAGNOSIS — Z23 Encounter for immunization: Secondary | ICD-10-CM

## 2020-08-20 DIAGNOSIS — Z79899 Other long term (current) drug therapy: Secondary | ICD-10-CM | POA: Diagnosis not present

## 2020-08-20 DIAGNOSIS — N183 Chronic kidney disease, stage 3 unspecified: Secondary | ICD-10-CM | POA: Diagnosis not present

## 2020-08-20 DIAGNOSIS — Z720 Tobacco use: Secondary | ICD-10-CM | POA: Diagnosis not present

## 2020-08-20 DIAGNOSIS — I129 Hypertensive chronic kidney disease with stage 1 through stage 4 chronic kidney disease, or unspecified chronic kidney disease: Secondary | ICD-10-CM | POA: Diagnosis not present

## 2020-08-20 DIAGNOSIS — N184 Chronic kidney disease, stage 4 (severe): Secondary | ICD-10-CM | POA: Diagnosis not present

## 2020-08-20 DIAGNOSIS — E785 Hyperlipidemia, unspecified: Secondary | ICD-10-CM | POA: Diagnosis not present

## 2020-08-20 DIAGNOSIS — N2581 Secondary hyperparathyroidism of renal origin: Secondary | ICD-10-CM | POA: Diagnosis not present

## 2020-08-20 DIAGNOSIS — D631 Anemia in chronic kidney disease: Secondary | ICD-10-CM | POA: Diagnosis not present

## 2020-10-11 DIAGNOSIS — N184 Chronic kidney disease, stage 4 (severe): Secondary | ICD-10-CM | POA: Diagnosis not present

## 2020-10-22 ENCOUNTER — Ambulatory Visit (INDEPENDENT_AMBULATORY_CARE_PROVIDER_SITE_OTHER): Payer: Medicare Other | Admitting: Family Medicine

## 2020-10-22 ENCOUNTER — Encounter: Payer: Self-pay | Admitting: Family Medicine

## 2020-10-22 ENCOUNTER — Other Ambulatory Visit: Payer: Self-pay

## 2020-10-22 VITALS — BP 109/62 | HR 75 | Temp 96.9°F | Ht 69.6 in | Wt 186.2 lb

## 2020-10-22 DIAGNOSIS — E782 Mixed hyperlipidemia: Secondary | ICD-10-CM | POA: Diagnosis not present

## 2020-10-22 DIAGNOSIS — K2100 Gastro-esophageal reflux disease with esophagitis, without bleeding: Secondary | ICD-10-CM | POA: Diagnosis not present

## 2020-10-22 DIAGNOSIS — I129 Hypertensive chronic kidney disease with stage 1 through stage 4 chronic kidney disease, or unspecified chronic kidney disease: Secondary | ICD-10-CM | POA: Diagnosis not present

## 2020-10-22 DIAGNOSIS — N184 Chronic kidney disease, stage 4 (severe): Secondary | ICD-10-CM | POA: Diagnosis not present

## 2020-10-22 DIAGNOSIS — M1A3790 Chronic gout due to renal impairment, unspecified ankle and foot, without tophus (tophi): Secondary | ICD-10-CM

## 2020-10-22 DIAGNOSIS — N183 Chronic kidney disease, stage 3 unspecified: Secondary | ICD-10-CM

## 2020-10-22 MED ORDER — ALLOPURINOL 100 MG PO TABS: 200.0000 mg | ORAL_TABLET | Freq: Every day | ORAL | 3 refills | Status: DC

## 2020-10-22 MED ORDER — ATORVASTATIN CALCIUM 40 MG PO TABS: ORAL_TABLET | ORAL | 1 refills | Status: DC

## 2020-10-22 MED ORDER — PANTOPRAZOLE SODIUM 40 MG PO TBEC: 40.0000 mg | DELAYED_RELEASE_TABLET | Freq: Every day | ORAL | 1 refills | Status: DC

## 2020-10-22 MED ORDER — VALSARTAN 160 MG PO TABS
160.0000 mg | ORAL_TABLET | Freq: Every day | ORAL | 1 refills | Status: DC
Start: 2020-10-22 — End: 2021-05-17

## 2020-10-22 MED ORDER — FUROSEMIDE 40 MG PO TABS: 40.0000 mg | ORAL_TABLET | Freq: Every day | ORAL | 1 refills | Status: DC

## 2020-10-22 NOTE — Progress Notes (Signed)
Subjective:  Patient ID: Michael Cervantes, male    DOB: 1946/02/26  Age: 75 y.o. MRN: [id number]  CC: Medical Management of Chronic Issues   HPI Michael Cervantes presents for  follow-up of hypertension. Patient has no history of headache chest pain or shortness of breath or recent cough. Patient also denies symptoms of TIA such as focal numbness or weakness. Patient denies side effects from medication. States taking it regularly.  in for follow-up of elevated cholesterol. Doing well without complaints on current medication. Denies side effects of statin including myalgia and arthralgia and nausea. Currently no chest pain, shortness of breath or other cardiovascular related symptoms noted.  Pt. Has polycystic kidney disease. Followed at [facility]. eGFR holding at 21%. Cr was 3.04 on 3/17.   Walking every other day.   History Michael Cervantes has a past medical history of Anemia, Arthritis, Bulging lumbar disc, Cellulitis (08/2014), Gout, Hyperlipidemia, OA (osteoarthritis) of foot, Polycystic kidney disease, and Sciatica (2016).   He has a past surgical history that includes Cyst excision and Tonsillectomy.   His family history includes Alzheimer's disease in his mother; Arthritis in his sister; Cancer in his father; Dementia in his mother.He reports that he quit smoking about 31 years ago. He has a 35.00 pack-year smoking history. His smokeless tobacco use includes snuff. He reports that he does not drink alcohol and does not use drugs.  Current Outpatient Medications on File Prior to Visit  Medication Sig Dispense Refill  . colchicine 0.6 MG tablet TAKE 2 TABS BY MOUTH when  Gout flares up THEN 1 AN HOUR LATER, THEN 1 TWICE A DAY UNTIL COMPLETE RELIEF THEN 1 DAILY 40 tablet 3  . Multiple Vitamins-Minerals (ONE-A-DAY MENS HEALTH FORMULA PO) Take 1 tablet by mouth daily.    . Triamcinolone Acetonide (TRIAMCINOLONE 0.1 % CREAM : EUCERIN) CREA Apply 1 application topically 3 (three) times daily as needed. 1 each  5   No current facility-administered medications on file prior to visit.    ROS Review of Systems  Constitutional: Negative for fever.  Respiratory: Negative for shortness of breath.   Cardiovascular: Negative for chest pain.  Musculoskeletal: Negative for arthralgias.  Skin: Negative for rash.    Objective:  BP 109/62   Pulse 75   Temp (!) 96.9 F (36.1 C)   Ht 5' 9.6" (1.768 m)   Wt 186 lb 3.2 oz (84.5 kg)   SpO2 100%   BMI 27.02 kg/m   BP Readings from Last 3 Encounters:  10/22/20 109/62  04/24/20 133/81  10/19/19 133/78    Wt Readings from Last 3 Encounters:  10/22/20 186 lb 3.2 oz (84.5 kg)  04/24/20 186 lb (84.4 kg)  10/19/19 195 lb 9.6 oz (88.7 kg)     Physical Exam Vitals reviewed.  Constitutional:      Appearance: He is well-developed.  HENT:     Head: Normocephalic and atraumatic.     Right Ear: Tympanic membrane and external ear normal. No decreased hearing noted.     Left Ear: Tympanic membrane and external ear normal. No decreased hearing noted.     Mouth/Throat:     Pharynx: No oropharyngeal exudate or posterior oropharyngeal erythema.  Eyes:     Pupils: Pupils are equal, round, and reactive to light.  Cardiovascular:     Rate and Rhythm: Normal rate and regular rhythm.     Heart sounds: No murmur heard.   Pulmonary:     Effort: No respiratory distress.  Breath sounds: Normal breath sounds.  Abdominal:     General: Bowel sounds are normal. There is distension (protuberant).     Palpations: Abdomen is soft. There is no mass.     Tenderness: There is no abdominal tenderness.  Musculoskeletal:     Cervical back: Normal range of motion and neck supple.       Assessment & Plan:   Michael Cervantes was seen today for medical management of chronic issues.  Diagnoses and all orders for this visit:  Stage 4 chronic kidney disease ([place])  Chronic gout due to renal impairment involving foot without tophus, unspecified laterality -      allopurinol (ZYLOPRIM) 100 MG tablet; Take 2 tablets (200 mg total) by mouth daily.  Mixed hyperlipidemia -     atorvastatin (LIPITOR) 40 MG tablet; TAKE 1 TABLET BY MOUTH EVERY DAY FOR CHOLESTEROL -     Lipid panel -     CMP14+EGFR  Chronic renal insufficiency, stage 4 (severe) (HCC) -     furosemide (LASIX) 40 MG tablet; Take 1 tablet (40 mg total) by mouth daily.  Gastroesophageal reflux disease with esophagitis without hemorrhage -     pantoprazole (PROTONIX) 40 MG tablet; Take 1 tablet (40 mg total) by mouth daily.  Benign hypertension with chronic kidney disease, stage III (HCC) -     valsartan (DIOVAN) 160 MG tablet; Take 1 tablet (160 mg total) by mouth daily. -     CMP14+EGFR   Allergies as of 10/22/2020      Reactions   Cefaclor Rash      Medication List       Accurate as of October 22, 2020 10:45 AM. If you have any questions, ask your nurse or doctor.        STOP taking these medications   ipratropium 0.06 % nasal spray Commonly known as: ATROVENT Stopped by: Claretta Fraise, MD     TAKE these medications   allopurinol 100 MG tablet Commonly known as: ZYLOPRIM Take 2 tablets (200 mg total) by mouth daily.   atorvastatin 40 MG tablet Commonly known as: LIPITOR TAKE 1 TABLET BY MOUTH EVERY DAY FOR CHOLESTEROL   colchicine 0.6 MG tablet TAKE 2 TABS BY MOUTH when  Gout flares up THEN 1 AN HOUR LATER, THEN 1 TWICE A DAY UNTIL COMPLETE RELIEF THEN 1 DAILY   furosemide 40 MG tablet Commonly known as: LASIX Take 1 tablet (40 mg total) by mouth daily.   ONE-A-DAY MENS HEALTH FORMULA PO Take 1 tablet by mouth daily.   pantoprazole 40 MG tablet Commonly known as: PROTONIX Take 1 tablet (40 mg total) by mouth daily.   triamcinolone 0.1 % cream : eucerin Crea Apply 1 application topically 3 (three) times daily as needed.   valsartan 160 MG tablet Commonly known as: DIOVAN Take 1 tablet (160 mg total) by mouth daily.       Meds ordered this encounter   Medications  . allopurinol (ZYLOPRIM) 100 MG tablet    Sig: Take 2 tablets (200 mg total) by mouth daily.    Dispense:  180 tablet    Refill:  3  . atorvastatin (LIPITOR) 40 MG tablet    Sig: TAKE 1 TABLET BY MOUTH EVERY DAY FOR CHOLESTEROL    Dispense:  90 tablet    Refill:  1  . furosemide (LASIX) 40 MG tablet    Sig: Take 1 tablet (40 mg total) by mouth daily.    Dispense:  90 tablet    Refill:  1  . pantoprazole (PROTONIX) 40 MG tablet    Sig: Take 1 tablet (40 mg total) by mouth daily.    Dispense:  90 tablet    Refill:  1  . valsartan (DIOVAN) 160 MG tablet    Sig: Take 1 tablet (160 mg total) by mouth daily.    Dispense:  90 tablet    Refill:  1      Follow-up: Return in about 6 months (around 04/24/2021) for [facility] physical.  Claretta Fraise, M.D.

## 2020-10-22 NOTE — Patient Instructions (Signed)
Diclofenac or Voltaren gel should be helpful for your knee and safe for the kidneys

## 2020-10-23 LAB — CMP14+EGFR
ALT: 27 IU/L (ref 0–44)
AST: 26 IU/L (ref 0–40)
Albumin/Globulin Ratio: 1.7 (ref 1.2–2.2)
Albumin: 4.3 g/dL (ref 3.7–4.7)
Alkaline Phosphatase: 122 IU/L — ABNORMAL HIGH (ref 44–121)
BUN/Creatinine Ratio: 13 (ref 10–24)
BUN: 43 mg/dL — ABNORMAL HIGH (ref 8–27)
Bilirubin Total: 0.3 mg/dL (ref 0.0–1.2)
CO2: 18 mmol/L — ABNORMAL LOW (ref 20–29)
Calcium: 9.7 mg/dL (ref 8.6–10.2)
Chloride: 106 mmol/L (ref 96–106)
Creatinine, Ser: 3.2 mg/dL (ref 0.76–1.27)
Globulin, Total: 2.6 g/dL (ref 1.5–4.5)
Glucose: 97 mg/dL (ref 65–99)
Potassium: 5.1 mmol/L (ref 3.5–5.2)
Sodium: 142 mmol/L (ref 134–144)
Total Protein: 6.9 g/dL (ref 6.0–8.5)
eGFR: 19 mL/min/{1.73_m2} — ABNORMAL LOW (ref >59)

## 2020-10-23 LAB — LIPID PANEL
Chol/HDL Ratio: 2.7 ratio (ref 0.0–5.0)
Cholesterol, Total: 151 mg/dL (ref 100–199)
HDL: 55 mg/dL (ref >39)
LDL Chol Calc (NIH): 77 mg/dL (ref 0–99)
Triglycerides: 104 mg/dL (ref 0–149)
VLDL Cholesterol Cal: 19 mg/dL (ref 5–40)

## 2020-10-25 ENCOUNTER — Other Ambulatory Visit: Payer: Self-pay | Admitting: *Deleted

## 2020-10-25 DIAGNOSIS — N184 Chronic kidney disease, stage 4 (severe): Secondary | ICD-10-CM

## 2020-11-01 ENCOUNTER — Other Ambulatory Visit: Payer: Self-pay

## 2020-11-01 ENCOUNTER — Other Ambulatory Visit: Payer: Medicare Other

## 2020-11-01 DIAGNOSIS — N184 Chronic kidney disease, stage 4 (severe): Secondary | ICD-10-CM | POA: Diagnosis not present

## 2020-11-02 LAB — CMP14+EGFR
ALT: 19 IU/L (ref 0–44)
AST: 15 IU/L (ref 0–40)
Albumin/Globulin Ratio: 1.8 (ref 1.2–2.2)
Albumin: 4.2 g/dL (ref 3.7–4.7)
Alkaline Phosphatase: 124 IU/L — ABNORMAL HIGH (ref 44–121)
BUN/Creatinine Ratio: 15 (ref 10–24)
BUN: 45 mg/dL — ABNORMAL HIGH (ref 8–27)
Bilirubin Total: 0.5 mg/dL (ref 0.0–1.2)
CO2: 18 mmol/L — ABNORMAL LOW (ref 20–29)
Calcium: 9.9 mg/dL (ref 8.6–10.2)
Chloride: 110 mmol/L — ABNORMAL HIGH (ref 96–106)
Creatinine, Ser: 2.94 mg/dL — ABNORMAL HIGH (ref 0.76–1.27)
Globulin, Total: 2.4 g/dL (ref 1.5–4.5)
Glucose: 107 mg/dL — ABNORMAL HIGH (ref 65–99)
Potassium: 4.9 mmol/L (ref 3.5–5.2)
Sodium: 145 mmol/L — ABNORMAL HIGH (ref 134–144)
Total Protein: 6.6 g/dL (ref 6.0–8.5)
eGFR: 22 mL/min/{1.73_m2} — ABNORMAL LOW (ref >59)

## 2020-11-19 DIAGNOSIS — H40033 Anatomical narrow angle, bilateral: Secondary | ICD-10-CM | POA: Diagnosis not present

## 2020-11-19 DIAGNOSIS — H2513 Age-related nuclear cataract, bilateral: Secondary | ICD-10-CM | POA: Diagnosis not present

## 2020-12-17 ENCOUNTER — Ambulatory Visit (INDEPENDENT_AMBULATORY_CARE_PROVIDER_SITE_OTHER): Payer: Medicare Other

## 2020-12-17 VITALS — Ht 70.0 in | Wt 185.0 lb

## 2020-12-17 DIAGNOSIS — Z0000 Encounter for general adult medical examination without abnormal findings: Secondary | ICD-10-CM

## 2020-12-17 NOTE — Progress Notes (Signed)
Subjective:   Michael Cervantes is a 75 y.o. male who presents for Medicare Annual/Subsequent preventive examination.  Virtual Visit via Telephone Note  I connected with  Michael Cervantes on 12/17/20 at  9:00 AM EDT by telephone and verified that I am speaking with the correct person using two identifiers.  Location: Patient: Home Provider: [facility] Persons participating in the virtual visit: patient/Nurse Health Advisor   I discussed the limitations, risks, security and privacy concerns of performing an evaluation and management service by telephone and the availability of in person appointments. The patient expressed understanding and agreed to proceed.  Interactive audio and video telecommunications were attempted between this nurse and patient, however failed, due to patient having technical difficulties OR patient did not have access to video capability.  We continued and completed visit with audio only.  Some vital signs may be absent or patient reported.   Czarina Gingras E Astoria Condon, LPN   Review of Systems     Cardiac Risk Factors include: advanced age (>63mn, >>63women);dyslipidemia;male gender;hypertension     Objective:    Today's Vitals   12/17/20 0919  Weight: 185 lb (83.9 kg)  Height: '5\' 10"'  (1.778 m)  PainSc: 3    Body mass index is 26.54 kg/m.  Advanced Directives 12/17/2020 11/02/2019 03/18/2016 11/22/2014 09/12/2014  Does Patient Have a Medical Advance Directive? No No Yes No No  Type of Advance Directive - -Public librarianLiving will - -  Does patient want to make changes to medical advance directive? - - No - Patient declined - -  Copy of HShinglehousein Chart? - - No - copy requested - -  Would patient like information on creating a medical advance directive? Yes (MAU/Ambulatory/Procedural Areas - Information given) No - Patient declined - Yes - Educational materials given No - patient declined information    Current Medications  (verified) Outpatient Encounter Medications as of 12/17/2020  Medication Sig  . allopurinol (ZYLOPRIM) 100 MG tablet Take 2 tablets (200 mg total) by mouth daily.  .Marland Kitchenatorvastatin (LIPITOR) 40 MG tablet TAKE 1 TABLET BY MOUTH EVERY DAY FOR CHOLESTEROL  . colchicine 0.6 MG tablet TAKE 2 TABS BY MOUTH when  Gout flares up THEN 1 AN HOUR LATER, THEN 1 TWICE A DAY UNTIL COMPLETE RELIEF THEN 1 DAILY  . furosemide (LASIX) 40 MG tablet Take 1 tablet (40 mg total) by mouth daily.  . Multiple Vitamins-Minerals (ONE-A-DAY MENS HEALTH FORMULA PO) Take 1 tablet by mouth daily.  . pantoprazole (PROTONIX) 40 MG tablet Take 1 tablet (40 mg total) by mouth daily.  . Triamcinolone Acetonide (TRIAMCINOLONE 0.1 % CREAM : EUCERIN) CREA Apply 1 application topically 3 (three) times daily as needed.  . valsartan (DIOVAN) 160 MG tablet Take 1 tablet (160 mg total) by mouth daily.   No facility-administered encounter medications on file as of 12/17/2020.    Allergies (verified) Cefaclor   History: Past Medical History:  Diagnosis Date  . Anemia   . Arthritis   . Bulging lumbar disc   . Cellulitis 08/2014   rt foot  . Gout   . Hyperlipidemia   . OA (osteoarthritis) of foot    rt foot  . Polycystic kidney disease   . Sciatica 2016   Past Surgical History:  Procedure Laterality Date  . CYST EXCISION     tail bone  . TONSILLECTOMY     Family History  Problem Relation Age of Onset  . Dementia Mother   .  Alzheimer's disease Mother   . Cancer Father        lymphoma met to brain  . Arthritis Sister    Social History   Socioeconomic History  . Marital status: Married    Spouse name: Not on file  . Number of children: 2  . Years of education: Not on file  . Highest education level: Some college, no degree  Occupational History  . Occupation: Retired  Tobacco Use  . Smoking status: Former Smoker    Packs/day: 1.00    Years: 35.00    Pack years: 35.00    Quit date: 07/28/1989    Years since  quitting: 31.4  . Smokeless tobacco: Current User    Types: Snuff  Vaping Use  . Vaping Use: Never used  Substance and Sexual Activity  . Alcohol use: No    Alcohol/week: 0.0 standard drinks  . Drug use: No  . Sexual activity: Yes  Other Topics Concern  . Not on file  Social History Narrative   Lives with wife. Wife's daughter lives in [place].   Social Determinants of Health   Financial Resource Strain: Low Risk   . Difficulty of Paying Living Expenses: Not hard at all  Food Insecurity: No Food Insecurity  . Worried About Charity fundraiser in the Last Year: Never true  . Ran Out of Food in the Last Year: Never true  Transportation Needs: No Transportation Needs  . Lack of Transportation (Medical): No  . Lack of Transportation (Non-Medical): No  Physical Activity: Sufficiently Active  . Days of Exercise per Week: 7 days  . Minutes of Exercise per Session: 40 min  Stress: No Stress Concern Present  . Feeling of Stress : Not at all  Social Connections: Socially Integrated  . Frequency of Communication with Friends and Family: More than three times a week  . Frequency of Social Gatherings with Friends and Family: More than three times a week  . Attends Religious Services: More than 4 times per year  . Active Member of Clubs or Organizations: Yes  . Attends Archivist Meetings: More than 4 times per year  . Marital Status: Married    Tobacco Counseling Ready to quit: Not Answered Counseling given: Not Answered   Clinical Intake:  Pre-visit preparation completed: Yes  Pain : 0-10 Pain Score: 3  Pain Type: Neuropathic pain Pain Location: Foot Pain Orientation: Right,Left Pain Descriptors / Indicators: Burning,Sore Pain Onset: More than a month ago Pain Frequency: Constant     BMI - recorded: 26.54 Nutritional Status: BMI 25 -29 Overweight Nutritional Risks: None Diabetes: No  How often do you need to have someone help you when you read  instructions, pamphlets, or other written materials from your doctor or pharmacy?: 1 - Never  Diabetic? no  Interpreter Needed?: No  Information entered by :: Naod Sweetland, LPN   Activities of Daily Living In your present state of health, do you have any difficulty performing the following activities: 12/17/2020  Hearing? Y  Vision? N  Difficulty concentrating or making decisions? N  Walking or climbing stairs? N  Dressing or bathing? N  Doing errands, shopping? N  Preparing Food and eating ? N  Using the Toilet? N  In the past six months, have you accidently leaked urine? N  Do you have problems with loss of bowel control? N  Managing your Medications? N  Managing your Finances? N  Housekeeping or managing your Housekeeping? N  Some recent data might  be hidden    Patient Care Team: Claretta Fraise, MD as PCP - General (Family Medicine) Rocco, Micheal Likens, MD as Referring Physician (Nephrology) Harlen Labs, MD as Referring Physician (Optometry)  Indicate any recent Medical Services you may have received from other than [facility] providers in the past year (date may be approximate).     Assessment:   This is a routine wellness examination for Michael Cervantes.  Hearing/Vision screen  Hearing Screening   '125Hz'  '250Hz'  '500Hz'  '1000Hz'  '2000Hz'  '3000Hz'  '4000Hz'  '6000Hz'  '8000Hz'   Right ear:           Left ear:           Comments: Wears hearing aids from [facility] in [place], [place]  Vision Screening Comments: Routine visits (every 2 years) with Dr Marin Comment in [place] and/or [facility] in [place] - up to date with eye exam  Dietary issues and exercise activities discussed: Current Exercise Habits: Home exercise routine, Type of exercise: walking, Time (Minutes): 45, Frequency (Times/Week): 7, Weekly Exercise (Minutes/Week): 315, Intensity: Mild, Exercise limited by: Other - see comments (Neuropathy)  Goals Addressed            This Visit's Progress   . Exercise 3x per week (30 min per time)   On  track     Depression Screen PHQ 2/9 Scores 12/17/2020 10/22/2020 04/24/2020 11/02/2019 10/19/2019 04/28/2019 07/31/2018  PHQ - 2 Score 0 0 0 0 0 0 0    Fall Risk Fall Risk  12/17/2020 10/22/2020 04/24/2020 11/02/2019 10/19/2019  Falls in the past year? 0 0 0 0 0  Number falls in past yr: 0 - - - 0  Injury with Fall? 0 - - - 0  Risk for fall due to : Other (Comment) - - - No Fall Risks  Risk for fall due to: Comment neuropathy - - - -  Follow up Education provided;Falls prevention discussed - Falls evaluation completed - Falls evaluation completed    FALL RISK PREVENTION PERTAINING TO THE HOME:  Any stairs in or around the home? No  If so, are there any without handrails? No  Home free of loose throw rugs in walkways, pet beds, electrical cords, etc? Yes  Adequate lighting in your home to reduce risk of falls? Yes   ASSISTIVE DEVICES UTILIZED TO PREVENT FALLS:  Life alert? No  Use of a cane, walker or w/c? No  Grab bars in the bathroom? Yes  Shower chair or bench in shower? Yes  Elevated toilet seat or a handicapped toilet? No   TIMED UP AND GO:  Was the test performed? No . Telephonic visit.  Cognitive Function: Normal cognitive status assessed by direct observation by this Nurse Health Advisor. No abnormalities found.   MMSE - Mini Mental State Exam 03/25/2017 03/18/2016 11/22/2014  Orientation to time '5 5 5  ' Orientation to Place '5 5 5  ' Registration '3 3 3  ' Attention/ Calculation '3 5 5  ' Recall '2 3 3  ' Language- name 2 objects '2 2 2  ' Language- repeat '1 1 1  ' Language- follow 3 step command '3 3 3  ' Language- read & follow direction '1 1 1  ' Write a sentence '1 1 1  ' Copy design '1 1 1  ' Total score '27 30 30     ' 6CIT Screen 11/02/2019  What Year? 0 points  What month? 0 points  What time? 0 points  Count back from 20 0 points  Months in reverse 0 points  Repeat phrase 0 points  Total  Score 0    Immunizations Immunization History  Administered Date(s) Administered  . Fluad Quad(high  Dose 65+) 04/28/2019  . Influenza, High Dose Seasonal PF 04/23/2016, 04/23/2016, 05/03/2018, 04/28/2019, 04/12/2020  . Influenza,inj,Quad PF,6+ Mos 06/05/2014, 06/18/2015, 04/24/2017  . Influenza-Unspecified 06/02/2013, 06/05/2014, 06/18/2015, 05/26/2016, 04/24/2017, 05/07/2017  . Moderna Sars-Covid-2 Vaccination 09/21/2019, 10/19/2019, 06/20/2020  . Pneumococcal Conjugate-13 10/19/2014, 10/19/2014, 06/12/2015  . Pneumococcal Polysaccharide-23 07/28/2005, 05/03/2018  . Pneumococcal-Unspecified 07/28/2005, 05/03/2018  . Tdap 12/13/2004  . Zoster 06/18/2015, 06/18/2015    TDAP status: Up to date  Flu Vaccine status: Up to date  Pneumococcal vaccine status: Up to date  Covid-19 vaccine status: Completed vaccines  Qualifies for Shingles Vaccine? Yes   Zostavax completed Yes   Shingrix Completed?: No.    Education has been provided regarding the importance of this vaccine. Patient has been advised to call insurance company to determine out of pocket expense if they have not yet received this vaccine. Advised may also receive vaccine at local pharmacy or Health Dept. Verbalized acceptance and understanding.  Screening Tests Health Maintenance  Topic Date Due  . INFLUENZA VACCINE  02/25/2021  . TETANUS/TDAP  10/10/2021  . COLONOSCOPY (Pts 45-76yr Insurance coverage will need to be confirmed)  10/24/2025  . COVID-19 Vaccine  Completed  . Hepatitis C Screening  Completed  . PNA vac Low Risk Adult  Completed  . HPV VACCINES  Aged Out    Health Maintenance  There are no preventive care reminders to display for this patient.  Colorectal cancer screening: Type of screening: Colonoscopy. Completed 10/25/2015. Repeat every 10 years  Lung Cancer Screening: (Low Dose CT Chest recommended if Age 76-80years, 30 pack-year currently smoking OR have quit w/in 15years.) does not qualify.  Additional Screening:  Hepatitis C Screening: does qualify; Completed 04/23/2016  Vision Screening:  Recommended annual ophthalmology exams for early detection of glaucoma and other disorders of the eye. Is the patient up to date with their annual eye exam?  Yes  Who is the provider or what is the name of the office in which the patient attends annual eye exams? Dr LMarin Commentand [facility] If pt is not established with a provider, would they like to be referred to a provider to establish care? No .   Dental Screening: Recommended annual dental exams for proper oral hygiene  Community Resource Referral / Chronic Care Management: CRR required this visit?  No   CCM required this visit?  No      Plan:     I have personally reviewed and noted the following in the patient's chart:   . Medical and social history . Use of alcohol, tobacco or illicit drugs  . Current medications and supplements including opioid prescriptions. Patient is not currently taking opioid prescriptions. . Functional ability and status . Nutritional status . Physical activity . Advanced directives . List of other physicians . Hospitalizations, surgeries, and ER visits in previous 12 months . Vitals . Screenings to include cognitive, depression, and falls . Referrals and appointments  In addition, I have reviewed and discussed with patient certain preventive protocols, quality metrics, and best practice recommendations. A written personalized care plan for preventive services as well as general preventive health recommendations were provided to patient.     ASandrea Hammond LPN   [id number]  Nurse Notes: None

## 2020-12-17 NOTE — Patient Instructions (Signed)
Michael Cervantes , Thank you for taking time to come for your Medicare Wellness Visit. I appreciate your ongoing commitment to your health goals. Please review the following plan we discussed and let me know if I can assist you in the future.   Screening recommendations/referrals: Colonoscopy: Done 10/25/2015 - Repeat in 10 years Recommended yearly ophthalmology/optometry visit for glaucoma screening and checkup Recommended yearly dental visit for hygiene and checkup  Vaccinations: Influenza vaccine: Done 04/12/2020 - Repeat annually Pneumococcal vaccine: Done 2007 and 2019  Tdap vaccine: Done 12/13/2004 - Past due (every 10 years) Shingles vaccine: Shingrix discussed. Please contact your pharmacy for coverage information.    Covid-19: Done 09/21/2019, 10/19/2019, & 06/20/2020 - due for second booster if desired  - check with local pharmacies or health department  Advanced directives: Advance directive discussed with you today. I have provided a copy for you to complete at home and have notarized. Once this is complete please bring a copy in to our office so we can scan it into your chart.  Conditions/risks identified: Aim for 30 minutes of exercise or brisk walking each day, drink 6-8 glasses of water and eat lots of fruits and vegetables.  Next appointment: Follow up in one year for your annual wellness visit.   Preventive Care 35 Years and Older, Male  Preventive care refers to lifestyle choices and visits with your health care provider that can promote health and wellness. What does preventive care include?  A yearly physical exam. This is also called an annual well check.  Dental exams once or twice a year.  Routine eye exams. Ask your health care provider how often you should have your eyes checked.  Personal lifestyle choices, including:  Daily care of your teeth and gums.  Regular physical activity.  Eating a healthy diet.  Avoiding tobacco and drug use.  Limiting alcohol  use.  Practicing safe sex.  Taking low doses of aspirin every day.  Taking vitamin and mineral supplements as recommended by your health care provider. What happens during an annual well check? The services and screenings done by your health care provider during your annual well check will depend on your age, overall health, lifestyle risk factors, and family history of disease. Counseling  Your health care provider may ask you questions about your:  Alcohol use.  Tobacco use.  Drug use.  Emotional well-being.  Home and relationship well-being.  Sexual activity.  Eating habits.  History of falls.  Memory and ability to understand (cognition).  Work and work Statistician. Screening  You may have the following tests or measurements:  Height, weight, and BMI.  Blood pressure.  Lipid and cholesterol levels. These may be checked every 5 years, or more frequently if you are over 79 years old.  Skin check.  Lung cancer screening. You may have this screening every year starting at age 76 if you have a 30-pack-year history of smoking and currently smoke or have quit within the past 15 years.  Fecal occult blood test (FOBT) of the stool. You may have this test every year starting at age 76.  Flexible sigmoidoscopy or colonoscopy. You may have a sigmoidoscopy every 5 years or a colonoscopy every 10 years starting at age 76.  Prostate cancer screening. Recommendations will vary depending on your family history and other risks.  Hepatitis C blood test.  Hepatitis B blood test.  Sexually transmitted disease (STD) testing.  Diabetes screening. This is done by checking your blood sugar (glucose) after you have  not eaten for a while (fasting). You may have this done every 1-3 years.  Abdominal aortic aneurysm (AAA) screening. You may need this if you are a current or former smoker.  Osteoporosis. You may be screened starting at age 76 if you are at high risk. Talk with your  health care provider about your test results, treatment options, and if necessary, the need for more tests. Vaccines  Your health care provider may recommend certain vaccines, such as:  Influenza vaccine. This is recommended every year.  Tetanus, diphtheria, and acellular pertussis (Tdap, Td) vaccine. You may need a Td booster every 10 years.  Zoster vaccine. You may need this after age 76.  Pneumococcal 13-valent conjugate (PCV13) vaccine. One dose is recommended after age 76.  Pneumococcal polysaccharide (PPSV23) vaccine. One dose is recommended after age 2. Talk to your health care provider about which screenings and vaccines you need and how often you need them. This information is not intended to replace advice given to you by your health care provider. Make sure you discuss any questions you have with your health care provider. Document Released: 08/10/2015 Document Revised: 04/02/2016 Document Reviewed: 05/15/2015 Elsevier Interactive Patient Education  2017 [place] Prevention in the Home Falls can cause injuries. They can happen to people of all ages. There are many things you can do to make your home safe and to help prevent falls. What can I do on the outside of my home?  Regularly fix the edges of walkways and driveways and fix any cracks.  Remove anything that might make you trip as you walk through a door, such as a raised step or threshold.  Trim any bushes or trees on the path to your home.  Use bright outdoor lighting.  Clear any walking paths of anything that might make someone trip, such as rocks or tools.  Regularly check to see if handrails are loose or broken. Make sure that both sides of any steps have handrails.  Any raised decks and porches should have guardrails on the edges.  Have any leaves, snow, or ice cleared regularly.  Use sand or salt on walking paths during winter.  Clean up any spills in your garage right away. This includes oil  or grease spills. What can I do in the bathroom?  Use night lights.  Install grab bars by the toilet and in the tub and shower. Do not use towel bars as grab bars.  Use non-skid mats or decals in the tub or shower.  If you need to sit down in the shower, use a plastic, non-slip stool.  Keep the floor dry. Clean up any water that spills on the floor as soon as it happens.  Remove soap buildup in the tub or shower regularly.  Attach bath mats securely with double-sided non-slip rug tape.  Do not have throw rugs and other things on the floor that can make you trip. What can I do in the bedroom?  Use night lights.  Make sure that you have a light by your bed that is easy to reach.  Do not use any sheets or blankets that are too big for your bed. They should not hang down onto the floor.  Have a firm chair that has side arms. You can use this for support while you get dressed.  Do not have throw rugs and other things on the floor that can make you trip. What can I do in the kitchen?  Clean up any  spills right away.  Avoid walking on wet floors.  Keep items that you use a lot in easy-to-reach places.  If you need to reach something above you, use a strong step stool that has a grab bar.  Keep electrical cords out of the way.  Do not use floor polish or wax that makes floors slippery. If you must use wax, use non-skid floor wax.  Do not have throw rugs and other things on the floor that can make you trip. What can I do with my stairs?  Do not leave any items on the stairs.  Make sure that there are handrails on both sides of the stairs and use them. Fix handrails that are broken or loose. Make sure that handrails are as long as the stairways.  Check any carpeting to make sure that it is firmly attached to the stairs. Fix any carpet that is loose or worn.  Avoid having throw rugs at the top or bottom of the stairs. If you do have throw rugs, attach them to the floor with  carpet tape.  Make sure that you have a light switch at the top of the stairs and the bottom of the stairs. If you do not have them, ask someone to add them for you. What else can I do to help prevent falls?  Wear shoes that:  Do not have high heels.  Have rubber bottoms.  Are comfortable and fit you well.  Are closed at the toe. Do not wear sandals.  If you use a stepladder:  Make sure that it is fully opened. Do not climb a closed stepladder.  Make sure that both sides of the stepladder are locked into place.  Ask someone to hold it for you, if possible.  Clearly mark and make sure that you can see:  Any grab bars or handrails.  First and last steps.  Where the edge of each step is.  Use tools that help you move around (mobility aids) if they are needed. These include:  Canes.  Walkers.  Scooters.  Crutches.  Turn on the lights when you go into a dark area. Replace any light bulbs as soon as they burn out.  Set up your furniture so you have a clear path. Avoid moving your furniture around.  If any of your floors are uneven, fix them.  If there are any pets around you, be aware of where they are.  Review your medicines with your doctor. Some medicines can make you feel dizzy. This can increase your chance of falling. Ask your doctor what other things that you can do to help prevent falls. This information is not intended to replace advice given to you by your health care provider. Make sure you discuss any questions you have with your health care provider. Document Released: 05/10/2009 Document Revised: 12/20/2015 Document Reviewed: 08/18/2014 Elsevier Interactive Patient Education  2017 [employer].

## 2020-12-17 NOTE — Progress Notes (Signed)
I have reviewed the AWV documentation and agree with the written assessment and plan of care.  Salome Cozby, FNP-C [facility]  

## 2020-12-27 DIAGNOSIS — Z87891 Personal history of nicotine dependence: Secondary | ICD-10-CM | POA: Diagnosis not present

## 2020-12-27 DIAGNOSIS — D631 Anemia in chronic kidney disease: Secondary | ICD-10-CM | POA: Diagnosis not present

## 2020-12-27 DIAGNOSIS — E785 Hyperlipidemia, unspecified: Secondary | ICD-10-CM | POA: Diagnosis not present

## 2020-12-27 DIAGNOSIS — I129 Hypertensive chronic kidney disease with stage 1 through stage 4 chronic kidney disease, or unspecified chronic kidney disease: Secondary | ICD-10-CM | POA: Diagnosis not present

## 2020-12-27 DIAGNOSIS — N183 Chronic kidney disease, stage 3 unspecified: Secondary | ICD-10-CM | POA: Diagnosis not present

## 2020-12-27 DIAGNOSIS — N2581 Secondary hyperparathyroidism of renal origin: Secondary | ICD-10-CM | POA: Diagnosis not present

## 2020-12-27 DIAGNOSIS — N184 Chronic kidney disease, stage 4 (severe): Secondary | ICD-10-CM | POA: Diagnosis not present

## 2021-01-18 ENCOUNTER — Ambulatory Visit (INDEPENDENT_AMBULATORY_CARE_PROVIDER_SITE_OTHER): Payer: Medicare Other | Admitting: Family Medicine

## 2021-01-18 ENCOUNTER — Encounter: Payer: Self-pay | Admitting: Family Medicine

## 2021-01-18 ENCOUNTER — Other Ambulatory Visit: Payer: Self-pay

## 2021-01-18 VITALS — BP 103/66 | HR 88 | Temp 96.9°F | Ht 70.0 in | Wt 179.2 lb

## 2021-01-18 DIAGNOSIS — B360 Pityriasis versicolor: Secondary | ICD-10-CM | POA: Diagnosis not present

## 2021-01-18 MED ORDER — FLUCONAZOLE 100 MG PO TABS: ORAL_TABLET | ORAL | 0 refills | Status: DC

## 2021-01-18 NOTE — Progress Notes (Signed)
No chief complaint on file.   HPI  Patient presents today for recently noted dark pigmentation occurring on the lower legs.  There is no pain or tenderness.  No known injury.  Present for a few weeks. PMH: Smoking status noted ROS: Per HPI  Objective: BP 103/66   Pulse 88   Temp (!) 96.9 F (36.1 C)   Ht _0  (1.778 m)   Wt 179 lb 3.2 oz (81.3 kg)   SpO2 99%   BMI 25.71 kg/m  Gen: NAD, alert, cooperative with exam  Ext: No edema, warm deep brown pigmentation of the lower shins to the ankles.  Bilateral.  There is no associated erythema or edema. Neuro: Alert and oriented, No gross deficits  Assessment and plan:  1. 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 Then starting the next day take one daily until all are taken.    Dispense:  15 tablet    Refill:  0    Orders Placed This Encounter  Procedures   CBC with Differential/Platelet   CMP14+EGFR    Order Specific Question:   Has the patient fasted?    Answer:   Yes    Order Specific Question:   Release to patient    Answer:   Immediate    Follow up as needed.  Claretta Fraise, MD

## 2021-01-19 LAB — CMP14+EGFR
ALT: 16 IU/L (ref 0–44)
AST: 17 IU/L (ref 0–40)
Albumin/Globulin Ratio: 1.6 (ref 1.2–2.2)
Albumin: 4.1 g/dL (ref 3.7–4.7)
Alkaline Phosphatase: 123 IU/L — ABNORMAL HIGH (ref 44–121)
BUN/Creatinine Ratio: 17 (ref 10–24)
BUN: 48 mg/dL — ABNORMAL HIGH (ref 8–27)
Bilirubin Total: 0.4 mg/dL (ref 0.0–1.2)
CO2: 18 mmol/L — ABNORMAL LOW (ref 20–29)
Calcium: 9.3 mg/dL (ref 8.6–10.2)
Chloride: 105 mmol/L (ref 96–106)
Creatinine, Ser: 2.9 mg/dL — ABNORMAL HIGH (ref 0.76–1.27)
Globulin, Total: 2.5 g/dL (ref 1.5–4.5)
Glucose: 98 mg/dL (ref 65–99)
Potassium: 4.9 mmol/L (ref 3.5–5.2)
Sodium: 140 mmol/L (ref 134–144)
Total Protein: 6.6 g/dL (ref 6.0–8.5)
eGFR: 22 mL/min/{1.73_m2} — ABNORMAL LOW (ref >59)

## 2021-01-19 LAB — CBC WITH DIFFERENTIAL/PLATELET
Basophils Absolute: 0 10*3/uL (ref 0.0–0.2)
Basos: 0 %
EOS (ABSOLUTE): 0.2 10*3/uL (ref 0.0–0.4)
Eos: 2 %
Hematocrit: 33.4 % — ABNORMAL LOW (ref 37.5–51.0)
Hemoglobin: 11.6 g/dL — ABNORMAL LOW (ref 13.0–17.7)
Immature Grans (Abs): 0 10*3/uL (ref 0.0–0.1)
Immature Granulocytes: 0 %
Lymphocytes Absolute: 1.3 10*3/uL (ref 0.7–3.1)
Lymphs: 19 %
MCH: 33.7 pg — ABNORMAL HIGH (ref 26.6–33.0)
MCHC: 34.7 g/dL (ref 31.5–35.7)
MCV: 97 fL (ref 79–97)
Monocytes Absolute: 0.7 10*3/uL (ref 0.1–0.9)
Monocytes: 10 %
Neutrophils Absolute: 4.7 10*3/uL (ref 1.4–7.0)
Neutrophils: 69 %
Platelets: 227 10*3/uL (ref 150–450)
RBC: 3.44 x10E6/uL — ABNORMAL LOW (ref 4.14–5.80)
RDW: 13 % (ref 11.6–15.4)
WBC: 6.8 10*3/uL (ref 3.4–10.8)

## 2021-01-21 NOTE — Progress Notes (Signed)
Hello Aadarsh,  Your lab result is normal and/or stable.Some minor variations that are not significant are commonly marked abnormal, but do not represent any medical problem for you.  Best regards, Elyna Pangilinan, M.D.

## 2021-02-20 DIAGNOSIS — L579 Skin changes due to chronic exposure to nonionizing radiation, unspecified: Secondary | ICD-10-CM | POA: Diagnosis not present

## 2021-02-20 DIAGNOSIS — L80 Vitiligo: Secondary | ICD-10-CM | POA: Diagnosis not present

## 2021-04-03 DIAGNOSIS — S20461A Insect bite (nonvenomous) of right back wall of thorax, initial encounter: Secondary | ICD-10-CM | POA: Diagnosis not present

## 2021-04-03 DIAGNOSIS — L80 Vitiligo: Secondary | ICD-10-CM | POA: Diagnosis not present

## 2021-04-25 ENCOUNTER — Encounter: Payer: Self-pay | Admitting: Family Medicine

## 2021-04-25 ENCOUNTER — Ambulatory Visit (INDEPENDENT_AMBULATORY_CARE_PROVIDER_SITE_OTHER): Payer: Medicare Other | Admitting: Family Medicine

## 2021-04-25 ENCOUNTER — Other Ambulatory Visit: Payer: Self-pay

## 2021-04-25 VITALS — BP 109/66 | HR 84 | Temp 97.6°F | Ht 70.0 in | Wt 178.8 lb

## 2021-04-25 DIAGNOSIS — N189 Chronic kidney disease, unspecified: Secondary | ICD-10-CM | POA: Diagnosis not present

## 2021-04-25 DIAGNOSIS — H903 Sensorineural hearing loss, bilateral: Secondary | ICD-10-CM | POA: Diagnosis not present

## 2021-04-25 DIAGNOSIS — E782 Mixed hyperlipidemia: Secondary | ICD-10-CM | POA: Diagnosis not present

## 2021-04-25 DIAGNOSIS — I129 Hypertensive chronic kidney disease with stage 1 through stage 4 chronic kidney disease, or unspecified chronic kidney disease: Secondary | ICD-10-CM

## 2021-04-25 DIAGNOSIS — Z0001 Encounter for general adult medical examination with abnormal findings: Secondary | ICD-10-CM

## 2021-04-25 DIAGNOSIS — M1A3790 Chronic gout due to renal impairment, unspecified ankle and foot, without tophus (tophi): Secondary | ICD-10-CM | POA: Diagnosis not present

## 2021-04-25 DIAGNOSIS — Z23 Encounter for immunization: Secondary | ICD-10-CM | POA: Diagnosis not present

## 2021-04-25 DIAGNOSIS — D631 Anemia in chronic kidney disease: Secondary | ICD-10-CM

## 2021-04-25 DIAGNOSIS — Z0000 Encounter for general adult medical examination without abnormal findings: Secondary | ICD-10-CM

## 2021-04-25 DIAGNOSIS — Z125 Encounter for screening for malignant neoplasm of prostate: Secondary | ICD-10-CM

## 2021-04-25 DIAGNOSIS — K2100 Gastro-esophageal reflux disease with esophagitis, without bleeding: Secondary | ICD-10-CM

## 2021-04-25 DIAGNOSIS — N183 Chronic kidney disease, stage 3 unspecified: Secondary | ICD-10-CM

## 2021-04-25 DIAGNOSIS — E559 Vitamin D deficiency, unspecified: Secondary | ICD-10-CM

## 2021-04-25 DIAGNOSIS — Q613 Polycystic kidney, unspecified: Secondary | ICD-10-CM

## 2021-04-25 DIAGNOSIS — N184 Chronic kidney disease, stage 4 (severe): Secondary | ICD-10-CM

## 2021-04-25 NOTE — Progress Notes (Signed)
Subjective:  Patient ID: Michael Cervantes, male    DOB: August 06, 1945  Age: 75 y.o. MRN: [id number]  CC: Annual Exam   HPI Michael Cervantes presents for Annual physical. Has long-term polycystic kidney disease.  Kidney function has been stable at about 20% for a long time now.  He has an upcoming follow-up with his kidney specialist as well.  He is followed for multiple diagnoses as well including chronic gout, Hypertension related to his chronic renal insufficiency.  He is also followed for hyperlipidemia and vitamin D deficiency.  His renal insufficiency has also caused chronic anemia.  Patient in for follow-up of GERD. Currently asymptomatic taking  PPI daily. There is no chest pain or heartburn. No hematemesis and no melena. No dysphagia or choking. Onset is remote. Progression is stable. Complicating factors, none.   Depression screen [facility] 2/9 04/25/2021 01/18/2021 12/17/2020  Decreased Interest 0 0 0  Down, Depressed, Hopeless 0 0 0  PHQ - 2 Score 0 0 0    History Michael Cervantes has a past medical history of Anemia, Arthritis, Bulging lumbar disc, Cellulitis (08/2014), Gout, Hyperlipidemia, OA (osteoarthritis) of foot, Polycystic kidney disease, and Sciatica (2016).   He has a past surgical history that includes Cyst excision and Tonsillectomy.   His family history includes Alzheimer's disease in his mother; Arthritis in his sister; Cancer in his father; Dementia in his mother.He reports that he quit smoking about 31 years ago. He has a 35.00 pack-year smoking history. His smokeless tobacco use includes snuff. He reports that he does not drink alcohol and does not use drugs.    ROS Review of Systems  Constitutional:  Negative for activity change, fatigue and unexpected weight change.  HENT:  Negative for congestion, ear pain, hearing loss, postnasal drip and trouble swallowing.   Eyes:  Negative for pain and visual disturbance.  Respiratory:  Negative for cough, chest tightness and shortness of  breath.   Cardiovascular:  Negative for chest pain, palpitations and leg swelling.  Gastrointestinal:  Negative for abdominal distention, abdominal pain, blood in stool, constipation, diarrhea, nausea and vomiting.  Endocrine: Negative for cold intolerance, heat intolerance and polydipsia.  Genitourinary:  Negative for difficulty urinating, dysuria, flank pain, frequency and urgency.  Musculoskeletal:  Negative for arthralgias and joint swelling.  Skin:  Negative for color change, rash and wound.  Neurological:  Negative for dizziness, syncope, speech difficulty, weakness, light-headedness, numbness and headaches.  Hematological:  Does not bruise/bleed easily.  Psychiatric/Behavioral:  Negative for confusion, decreased concentration, dysphoric mood and sleep disturbance. The patient is not nervous/anxious.    Objective:  BP 109/66   Pulse 84   Temp 97.6 F (36.4 C)   Ht '5\' 10"'  (1.778 m)   Wt 178 lb 12.8 oz (81.1 kg)   SpO2 100%   BMI 25.66 kg/m   BP Readings from Last 3 Encounters:  04/25/21 109/66  01/18/21 103/66  10/22/20 109/62    Wt Readings from Last 3 Encounters:  04/25/21 178 lb 12.8 oz (81.1 kg)  01/18/21 179 lb 3.2 oz (81.3 kg)  12/17/20 185 lb (83.9 kg)     Physical Exam Constitutional:      Appearance: He is well-developed.  HENT:     Head: Normocephalic and atraumatic.  Eyes:     Pupils: Pupils are equal, round, and reactive to light.  Neck:     Thyroid: No thyromegaly.     Trachea: No tracheal deviation.  Cardiovascular:     Rate and Rhythm: Normal  rate and regular rhythm.     Heart sounds: Normal heart sounds. No murmur heard.   No friction rub. No gallop.  Pulmonary:     Breath sounds: Normal breath sounds. No wheezing or rales.  Abdominal:     General: Bowel sounds are normal. There is distension (protuberant).     Palpations: Abdomen is soft. There is no mass.     Tenderness: There is no abdominal tenderness. There is no guarding or rebound.      Hernia: A hernia (umbilical) is present. There is no hernia in the left inguinal area.  Genitourinary:    Penis: Normal.      Testes: Normal.  Musculoskeletal:        General: Normal range of motion.     Cervical back: Normal range of motion.  Lymphadenopathy:     Cervical: No cervical adenopathy.  Skin:    General: Skin is warm and dry.  Neurological:     Mental Status: He is alert and oriented to person, place, and time.      Assessment & Plan:   Michael Cervantes was seen today for annual exam.  Diagnoses and all orders for this visit:  Well adult exam -     VITAMIN D 25 Hydroxy (Vit-D Deficiency, Fractures) -     Lipid panel -     CMP14+EGFR -     CBC with Differential/Platelet -     Urinalysis  Benign hypertension with chronic kidney disease, stage III (HCC) -     CMP14+EGFR -     CBC with Differential/Platelet  Sensorineural hearing loss (SNHL) of both ears -     CMP14+EGFR -     CBC with Differential/Platelet  Gastroesophageal reflux disease with esophagitis without hemorrhage -     CMP14+EGFR -     CBC with Differential/Platelet  Mixed hyperlipidemia -     Lipid panel -     CMP14+EGFR -     CBC with Differential/Platelet  Vitamin D deficiency -     VITAMIN D 25 Hydroxy (Vit-D Deficiency, Fractures) -     CMP14+EGFR -     CBC with Differential/Platelet  Polycystic kidney disease -     CMP14+EGFR -     CBC with Differential/Platelet  Chronic gout due to renal impairment involving foot without tophus, unspecified laterality -     CMP14+EGFR -     CBC with Differential/Platelet -     Uric acid  Stage 4 chronic kidney disease (HCC) -     CMP14+EGFR -     CBC with Differential/Platelet -     Urinalysis  Anemia associated with chronic renal failure -     CMP14+EGFR -     CBC with Differential/Platelet  Screening for prostate cancer -     PSA Total (Reflex To Free)  Need for shingles vaccine -     Varicella-zoster vaccine IM (Shingrix)  Need for  immunization against influenza -     Flu Vaccine QUAD High Dose(Fluad)      I am having Michael Cervantes. Michael Cervantes maintain his Multiple Vitamins-Minerals (ONE-A-DAY MENS HEALTH FORMULA PO), colchicine, triamcinolone 0.1 % cream : eucerin, allopurinol, atorvastatin, furosemide, pantoprazole, valsartan, and fluconazole.  Allergies as of 04/25/2021       Reactions   Cefaclor Rash        Medication List        Accurate as of April 25, 2021 11:39 AM. If you have any questions, ask your nurse or doctor.  allopurinol 100 MG tablet Commonly known as: ZYLOPRIM Take 2 tablets (200 mg total) by mouth daily.   atorvastatin 40 MG tablet Commonly known as: LIPITOR TAKE 1 TABLET BY MOUTH EVERY DAY FOR CHOLESTEROL   colchicine 0.6 MG tablet TAKE 2 TABS BY MOUTH when  Gout flares up THEN 1 AN HOUR LATER, THEN 1 TWICE A DAY UNTIL COMPLETE RELIEF THEN 1 DAILY   fluconazole 100 MG tablet Commonly known as: Diflucan Take two with first dose. Then starting the next day take one daily until all are taken.   furosemide 40 MG tablet Commonly known as: LASIX Take 1 tablet (40 mg total) by mouth daily.   ONE-A-DAY MENS HEALTH FORMULA PO Take 1 tablet by mouth daily.   pantoprazole 40 MG tablet Commonly known as: PROTONIX Take 1 tablet (40 mg total) by mouth daily.   triamcinolone 0.1 % cream : eucerin Crea Apply 1 application topically 3 (three) times daily as needed.   valsartan 160 MG tablet Commonly known as: DIOVAN Take 1 tablet (160 mg total) by mouth daily.         Follow-up: No follow-ups on file.  Claretta Fraise, M.D.

## 2021-04-26 ENCOUNTER — Other Ambulatory Visit: Payer: Self-pay | Admitting: *Deleted

## 2021-04-26 LAB — CBC WITH DIFFERENTIAL/PLATELET
Basophils Absolute: 0.1 10*3/uL (ref 0.0–0.2)
Basos: 1 %
EOS (ABSOLUTE): 0.2 10*3/uL (ref 0.0–0.4)
Eos: 3 %
Hematocrit: 34.2 % — ABNORMAL LOW (ref 37.5–51.0)
Hemoglobin: 11.6 g/dL — ABNORMAL LOW (ref 13.0–17.7)
Immature Grans (Abs): 0 10*3/uL (ref 0.0–0.1)
Immature Granulocytes: 0 %
Lymphocytes Absolute: 1.5 10*3/uL (ref 0.7–3.1)
Lymphs: 17 %
MCH: 33.4 pg — ABNORMAL HIGH (ref 26.6–33.0)
MCHC: 33.9 g/dL (ref 31.5–35.7)
MCV: 99 fL — ABNORMAL HIGH (ref 79–97)
Monocytes Absolute: 0.8 10*3/uL (ref 0.1–0.9)
Monocytes: 9 %
Neutrophils Absolute: 5.9 10*3/uL (ref 1.4–7.0)
Neutrophils: 70 %
Platelets: 192 10*3/uL (ref 150–450)
RBC: 3.47 x10E6/uL — ABNORMAL LOW (ref 4.14–5.80)
RDW: 13.1 % (ref 11.6–15.4)
WBC: 8.5 10*3/uL (ref 3.4–10.8)

## 2021-04-26 LAB — LIPID PANEL
Chol/HDL Ratio: 3.1 ratio (ref 0.0–5.0)
Cholesterol, Total: 159 mg/dL (ref 100–199)
HDL: 51 mg/dL (ref >39)
LDL Chol Calc (NIH): 77 mg/dL (ref 0–99)
Triglycerides: 184 mg/dL — ABNORMAL HIGH (ref 0–149)
VLDL Cholesterol Cal: 31 mg/dL (ref 5–40)

## 2021-04-26 LAB — PSA TOTAL (REFLEX TO FREE): Prostate Specific Ag, Serum: 0.3 ng/mL (ref 0.0–4.0)

## 2021-04-26 LAB — CMP14+EGFR
ALT: 13 IU/L (ref 0–44)
AST: 17 IU/L (ref 0–40)
Albumin/Globulin Ratio: 2 (ref 1.2–2.2)
Albumin: 4.3 g/dL (ref 3.7–4.7)
Alkaline Phosphatase: 121 IU/L (ref 44–121)
BUN/Creatinine Ratio: 18 (ref 10–24)
BUN: 42 mg/dL — ABNORMAL HIGH (ref 8–27)
Bilirubin Total: 0.3 mg/dL (ref 0.0–1.2)
CO2: 18 mmol/L — ABNORMAL LOW (ref 20–29)
Calcium: 9.8 mg/dL (ref 8.6–10.2)
Chloride: 110 mmol/L — ABNORMAL HIGH (ref 96–106)
Creatinine, Ser: 2.39 mg/dL — ABNORMAL HIGH (ref 0.76–1.27)
Globulin, Total: 2.2 g/dL (ref 1.5–4.5)
Glucose: 94 mg/dL (ref 70–99)
Potassium: 4.4 mmol/L (ref 3.5–5.2)
Sodium: 144 mmol/L (ref 134–144)
Total Protein: 6.5 g/dL (ref 6.0–8.5)
eGFR: 28 mL/min/{1.73_m2} — ABNORMAL LOW (ref >59)

## 2021-04-26 LAB — URIC ACID: Uric Acid: 4.9 mg/dL (ref 3.8–8.4)

## 2021-04-26 LAB — VITAMIN D 25 HYDROXY (VIT D DEFICIENCY, FRACTURES): Vit D, 25-Hydroxy: 29.5 ng/mL — ABNORMAL LOW (ref 30.0–100.0)

## 2021-04-26 MED ORDER — VITAMIN D (ERGOCALCIFEROL) 1.25 MG (50000 UNIT) PO CAPS: 50000.0000 [IU] | ORAL_CAPSULE | ORAL | 3 refills | Status: DC

## 2021-04-26 NOTE — Progress Notes (Signed)
Dear Galen Daft, Your Vitamin D is  low. You need a prescription strength supplement I will send that in for you. Nurse, if at all possible, could you send in a prescription for the patient for vitamin D 50,000 units, 1 p.o. weekly #13 with 3 refills? Many thanks, WS

## 2021-04-29 DIAGNOSIS — N183 Chronic kidney disease, stage 3 unspecified: Secondary | ICD-10-CM | POA: Diagnosis not present

## 2021-04-29 DIAGNOSIS — I129 Hypertensive chronic kidney disease with stage 1 through stage 4 chronic kidney disease, or unspecified chronic kidney disease: Secondary | ICD-10-CM | POA: Diagnosis not present

## 2021-04-29 DIAGNOSIS — E872 Acidosis, unspecified: Secondary | ICD-10-CM | POA: Diagnosis not present

## 2021-04-29 DIAGNOSIS — N2581 Secondary hyperparathyroidism of renal origin: Secondary | ICD-10-CM | POA: Diagnosis not present

## 2021-04-29 DIAGNOSIS — E785 Hyperlipidemia, unspecified: Secondary | ICD-10-CM | POA: Diagnosis not present

## 2021-04-29 DIAGNOSIS — D631 Anemia in chronic kidney disease: Secondary | ICD-10-CM | POA: Diagnosis not present

## 2021-04-29 DIAGNOSIS — N184 Chronic kidney disease, stage 4 (severe): Secondary | ICD-10-CM | POA: Diagnosis not present

## 2021-05-17 ENCOUNTER — Other Ambulatory Visit: Payer: Self-pay | Admitting: Family Medicine

## 2021-05-17 DIAGNOSIS — N183 Chronic kidney disease, stage 3 unspecified: Secondary | ICD-10-CM

## 2021-05-17 DIAGNOSIS — E782 Mixed hyperlipidemia: Secondary | ICD-10-CM

## 2021-06-22 ENCOUNTER — Other Ambulatory Visit: Payer: Self-pay | Admitting: Family Medicine

## 2021-06-22 DIAGNOSIS — N184 Chronic kidney disease, stage 4 (severe): Secondary | ICD-10-CM

## 2021-08-09 ENCOUNTER — Other Ambulatory Visit: Payer: Self-pay | Admitting: Family Medicine

## 2021-08-09 DIAGNOSIS — K2100 Gastro-esophageal reflux disease with esophagitis, without bleeding: Secondary | ICD-10-CM

## 2021-08-20 DIAGNOSIS — H40033 Anatomical narrow angle, bilateral: Secondary | ICD-10-CM | POA: Diagnosis not present

## 2021-08-20 DIAGNOSIS — H2513 Age-related nuclear cataract, bilateral: Secondary | ICD-10-CM | POA: Diagnosis not present

## 2021-09-02 DIAGNOSIS — D631 Anemia in chronic kidney disease: Secondary | ICD-10-CM | POA: Diagnosis not present

## 2021-09-02 DIAGNOSIS — E785 Hyperlipidemia, unspecified: Secondary | ICD-10-CM | POA: Diagnosis not present

## 2021-09-02 DIAGNOSIS — Z8616 Personal history of COVID-19: Secondary | ICD-10-CM | POA: Diagnosis not present

## 2021-09-02 DIAGNOSIS — N183 Chronic kidney disease, stage 3 unspecified: Secondary | ICD-10-CM | POA: Diagnosis not present

## 2021-09-02 DIAGNOSIS — I129 Hypertensive chronic kidney disease with stage 1 through stage 4 chronic kidney disease, or unspecified chronic kidney disease: Secondary | ICD-10-CM | POA: Diagnosis not present

## 2021-09-02 DIAGNOSIS — N184 Chronic kidney disease, stage 4 (severe): Secondary | ICD-10-CM | POA: Diagnosis not present

## 2021-09-02 DIAGNOSIS — Z79899 Other long term (current) drug therapy: Secondary | ICD-10-CM | POA: Diagnosis not present

## 2021-09-02 DIAGNOSIS — N2581 Secondary hyperparathyroidism of renal origin: Secondary | ICD-10-CM | POA: Diagnosis not present

## 2021-10-21 DIAGNOSIS — M9902 Segmental and somatic dysfunction of thoracic region: Secondary | ICD-10-CM | POA: Diagnosis not present

## 2021-10-21 DIAGNOSIS — S338XXA Sprain of other parts of lumbar spine and pelvis, initial encounter: Secondary | ICD-10-CM | POA: Diagnosis not present

## 2021-10-21 DIAGNOSIS — M47812 Spondylosis without myelopathy or radiculopathy, cervical region: Secondary | ICD-10-CM | POA: Diagnosis not present

## 2021-10-21 DIAGNOSIS — M9901 Segmental and somatic dysfunction of cervical region: Secondary | ICD-10-CM | POA: Diagnosis not present

## 2021-10-21 DIAGNOSIS — M9903 Segmental and somatic dysfunction of lumbar region: Secondary | ICD-10-CM | POA: Diagnosis not present

## 2021-10-21 DIAGNOSIS — M47816 Spondylosis without myelopathy or radiculopathy, lumbar region: Secondary | ICD-10-CM | POA: Diagnosis not present

## 2021-10-21 DIAGNOSIS — S233XXA Sprain of ligaments of thoracic spine, initial encounter: Secondary | ICD-10-CM | POA: Diagnosis not present

## 2021-10-23 ENCOUNTER — Encounter: Payer: Self-pay | Admitting: Family Medicine

## 2021-10-23 ENCOUNTER — Ambulatory Visit (INDEPENDENT_AMBULATORY_CARE_PROVIDER_SITE_OTHER): Payer: Medicare Other | Admitting: Family Medicine

## 2021-10-23 VITALS — BP 123/73 | HR 63 | Temp 97.4°F | Ht 70.0 in | Wt 184.8 lb

## 2021-10-23 DIAGNOSIS — I129 Hypertensive chronic kidney disease with stage 1 through stage 4 chronic kidney disease, or unspecified chronic kidney disease: Secondary | ICD-10-CM | POA: Diagnosis not present

## 2021-10-23 DIAGNOSIS — Z23 Encounter for immunization: Secondary | ICD-10-CM

## 2021-10-23 DIAGNOSIS — N184 Chronic kidney disease, stage 4 (severe): Secondary | ICD-10-CM

## 2021-10-23 DIAGNOSIS — E782 Mixed hyperlipidemia: Secondary | ICD-10-CM | POA: Diagnosis not present

## 2021-10-23 DIAGNOSIS — K2100 Gastro-esophageal reflux disease with esophagitis, without bleeding: Secondary | ICD-10-CM | POA: Diagnosis not present

## 2021-10-23 DIAGNOSIS — N183 Chronic kidney disease, stage 3 unspecified: Secondary | ICD-10-CM

## 2021-10-23 DIAGNOSIS — Q613 Polycystic kidney, unspecified: Secondary | ICD-10-CM

## 2021-10-23 MED ORDER — VALSARTAN 160 MG PO TABS: 160.0000 mg | ORAL_TABLET | Freq: Every day | ORAL | 3 refills | Status: DC

## 2021-10-23 MED ORDER — ATORVASTATIN CALCIUM 40 MG PO TABS: ORAL_TABLET | ORAL | 3 refills | Status: DC

## 2021-10-23 MED ORDER — FAMOTIDINE 40 MG PO TABS: 40.0000 mg | ORAL_TABLET | Freq: Every day | ORAL | 3 refills | Status: DC

## 2021-10-23 MED ORDER — FUROSEMIDE 40 MG PO TABS: 40.0000 mg | ORAL_TABLET | Freq: Every day | ORAL | 2 refills | Status: DC

## 2021-10-23 NOTE — Progress Notes (Addendum)
? ?Subjective:  ?Patient ID: Michael Cervantes, male    DOB: Aug 13, 1945  Age: 76 y.o. MRN: [id number] ? ?CC: Medical Management of Chronic Issues ? ? ?HPI ?Galen Daft presents for  presents for  follow-up of hypertension. Patient has no history of headache chest pain or shortness of breath or recent cough. Patient also denies symptoms of TIA such as focal numbness or weakness. Patient denies side effects from medication. States taking it regularly.Renal insufficiency at 23% GFR. Followed by Nephrology. Has PolyCystic kidney dx. ? ? in for follow-up of elevated cholesterol. Doing well without complaints on current medication. Denies side effects of statin including myalgia and arthralgia and nausea. Currently no chest pain, shortness of breath or other cardiovascular related symptoms noted. ? ? ? ?  10/23/2021  ?  9:45 AM 04/25/2021  ? 10:57 AM 01/18/2021  ? 11:09 AM  ?Depression screen PHQ 2/9  ?Decreased Interest 0 0 0  ?Down, Depressed, Hopeless 0 0 0  ?PHQ - 2 Score 0 0 0  ? ? ?History ?Dymond has a past medical history of Anemia, Arthritis, Bulging lumbar disc, Cellulitis (08/2014), Gout, Hyperlipidemia, OA (osteoarthritis) of foot, Polycystic kidney disease, and Sciatica (2016).  ? ?He has a past surgical history that includes Cyst excision and Tonsillectomy.  ? ?His family history includes Alzheimer's disease in his mother; Arthritis in his sister; Cancer in his father; Dementia in his mother.He reports that he quit smoking about 32 years ago. His smoking use included cigarettes. He has a 35.00 pack-year smoking history. His smokeless tobacco use includes snuff. He reports that he does not drink alcohol and does not use drugs. ? ? ? ?ROS ?Review of Systems  ?Constitutional: Negative.  Negative for chills, diaphoresis and fever.  ?HENT: Negative.  Negative for sore throat.   ?Eyes:  Negative for visual disturbance.  ?Respiratory:  Negative for cough and shortness of breath.   ?Cardiovascular:  Negative for chest  pain and leg swelling.  ?Gastrointestinal:  Negative for abdominal pain, diarrhea, nausea and vomiting.  ?Genitourinary:  Negative for difficulty urinating.  ?Musculoskeletal:  Negative for arthralgias, gait problem, myalgias and neck pain. Back pain: strained due to exertion Better with chiropractor. ?Skin:  Negative for rash.  ?Neurological:  Negative for numbness and headaches.  ?Psychiatric/Behavioral:  Negative for sleep disturbance.   ? ?Objective:  ?BP 123/73   Pulse 63   Temp (!) 97.4 ?F (36.3 ?C)   Ht '5\' 10"'  (1.778 m)   Wt 184 lb 12.8 oz (83.8 kg)   SpO2 99%   BMI 26.52 kg/m?  ? ?BP Readings from Last 3 Encounters:  ?10/23/21 123/73  ?04/25/21 109/66  ?01/18/21 103/66  ? ? ?Wt Readings from Last 3 Encounters:  ?10/23/21 184 lb 12.8 oz (83.8 kg)  ?04/25/21 178 lb 12.8 oz (81.1 kg)  ?01/18/21 179 lb 3.2 oz (81.3 kg)  ? ? ? ?Physical Exam ?Constitutional:   ?   General: He is not in acute distress. ?   Appearance: He is well-developed.  ?HENT:  ?   Head: Normocephalic and atraumatic.  ?   Right Ear: External ear normal.  ?   Left Ear: External ear normal.  ?   Nose: Nose normal.  ?Eyes:  ?   Conjunctiva/sclera: Conjunctivae normal.  ?   Pupils: Pupils are equal, round, and reactive to light.  ?Cardiovascular:  ?   Rate and Rhythm: Normal rate and regular rhythm.  ?   Heart sounds: Normal heart sounds. No murmur heard. ?  Pulmonary:  ?   Effort: Pulmonary effort is normal. No respiratory distress.  ?   Breath sounds: Normal breath sounds. No wheezing or rales.  ?Abdominal:  ?   Palpations: Abdomen is soft.  ?   Tenderness: There is no abdominal tenderness.  ?Musculoskeletal:     ?   General: Normal range of motion.  ?   Cervical back: Normal range of motion and neck supple.  ?Skin: ?   General: Skin is warm and dry.  ?Neurological:  ?   Mental Status: He is alert and oriented to person, place, and time.  ?   Deep Tendon Reflexes: Reflexes are normal and symmetric.  ?Psychiatric:     ?   Behavior: Behavior  normal.     ?   Thought Content: Thought content normal.     ?   Judgment: Judgment normal.  ? ? ? ? ?Assessment & Plan:  ? ?Lino was seen today for medical management of chronic issues. ? ?Diagnoses and all orders for this visit: ? ?Benign hypertension with chronic kidney disease, stage III (HCC) ?-     CBC with Differential/Platelet ?-     CMP14+EGFR ?-     valsartan (DIOVAN) 160 MG tablet; Take 1 tablet (160 mg total) by mouth daily. ? ?Mixed hyperlipidemia ?-     Lipid panel ?-     atorvastatin (LIPITOR) 40 MG tablet; TAKE 1 TABLET BY MOUTH EVERY DAY FOR CHOLESTEROL ? ?Stage 4 chronic kidney disease ([place]) ?-     CMP14+EGFR ? ?Gastroesophageal reflux disease with esophagitis without hemorrhage ? ?Chronic renal insufficiency, stage 4 (severe) (HCC) ?-     furosemide (LASIX) 40 MG tablet; Take 1 tablet (40 mg total) by mouth daily. ? ?Need for shingles vaccine ?-     Varicella-zoster vaccine IM (Shingrix) ? ?Need for Tdap vaccination ?-     Tdap vaccine greater than or equal to 7yo IM ? ?Polycystic kidney disease ? ?Other orders ?-     famotidine (PEPCID) 40 MG tablet; Take 1 tablet (40 mg total) by mouth daily. ? ? ? ? ? ? ?I have discontinued Aramis Weil. Eimer's fluconazole. I have also changed his valsartan and furosemide. Additionally, I am having him start on famotidine. Lastly, I am having him maintain his Multiple Vitamins-Minerals (ONE-A-DAY MENS HEALTH FORMULA PO), colchicine, triamcinolone 0.1 % cream : eucerin, allopurinol, Vitamin D (Ergocalciferol), pantoprazole, and atorvastatin. ? ?Allergies as of 10/23/2021   ? ?   Reactions  ? Cefaclor Rash  ? ?  ? ?  ?Medication List  ?  ? ?  ? Accurate as of October 23, 2021 10:19 AM. If you have any questions, ask your nurse or doctor.  ?  ?  ? ?  ? ?STOP taking these medications   ? ?fluconazole 100 MG tablet ?Commonly known as: Diflucan ?Stopped by: Claretta Fraise, MD ?  ? ?  ? ?TAKE these medications   ? ?allopurinol 100 MG tablet ?Commonly known as:  ZYLOPRIM ?Take 2 tablets (200 mg total) by mouth daily. ?  ?atorvastatin 40 MG tablet ?Commonly known as: LIPITOR ?TAKE 1 TABLET BY MOUTH EVERY DAY FOR CHOLESTEROL ?  ?colchicine 0.6 MG tablet ?TAKE 2 TABS BY MOUTH when  Gout flares up THEN 1 AN HOUR LATER, THEN 1 TWICE A DAY UNTIL COMPLETE RELIEF THEN 1 DAILY ?  ?famotidine 40 MG tablet ?Commonly known as: PEPCID ?Take 1 tablet (40 mg total) by mouth daily. ?Started by: Claretta Fraise, MD ?  ?furosemide  40 MG tablet ?Commonly known as: LASIX ?Take 1 tablet (40 mg total) by mouth daily. ?  ?ONE-A-DAY MENS HEALTH FORMULA PO ?Take 1 tablet by mouth daily. ?  ?pantoprazole 40 MG tablet ?Commonly known as: PROTONIX ?TAKE 1 TABLET BY MOUTH EVERY DAY ?  ?triamcinolone 0.1 % cream : eucerin Crea ?Apply 1 application topically 3 (three) times daily as needed. ?  ?valsartan 160 MG tablet ?Commonly known as: DIOVAN ?Take 1 tablet (160 mg total) by mouth daily. ?  ?Vitamin D (Ergocalciferol) 1.25 MG (50000 UNIT) Caps capsule ?Commonly known as: DRISDOL ?Take 1 capsule (50,000 Units total) by mouth every 7 (seven) days. ?  ? ?  ? ? ? ?Follow-up: Return in about 6 months (around 04/25/2022). ? ?Claretta Fraise, M.D. ?

## 2021-10-24 DIAGNOSIS — S338XXA Sprain of other parts of lumbar spine and pelvis, initial encounter: Secondary | ICD-10-CM | POA: Diagnosis not present

## 2021-10-24 DIAGNOSIS — M47812 Spondylosis without myelopathy or radiculopathy, cervical region: Secondary | ICD-10-CM | POA: Diagnosis not present

## 2021-10-24 DIAGNOSIS — M9902 Segmental and somatic dysfunction of thoracic region: Secondary | ICD-10-CM | POA: Diagnosis not present

## 2021-10-24 DIAGNOSIS — M47816 Spondylosis without myelopathy or radiculopathy, lumbar region: Secondary | ICD-10-CM | POA: Diagnosis not present

## 2021-10-24 DIAGNOSIS — S233XXA Sprain of ligaments of thoracic spine, initial encounter: Secondary | ICD-10-CM | POA: Diagnosis not present

## 2021-10-24 DIAGNOSIS — M9903 Segmental and somatic dysfunction of lumbar region: Secondary | ICD-10-CM | POA: Diagnosis not present

## 2021-10-24 DIAGNOSIS — M9901 Segmental and somatic dysfunction of cervical region: Secondary | ICD-10-CM | POA: Diagnosis not present

## 2021-10-24 LAB — CBC WITH DIFFERENTIAL/PLATELET
Basophils Absolute: 0 10*3/uL (ref 0.0–0.2)
Basos: 1 %
EOS (ABSOLUTE): 0.3 10*3/uL (ref 0.0–0.4)
Eos: 3 %
Hematocrit: 33.4 % — ABNORMAL LOW (ref 37.5–51.0)
Hemoglobin: 11.8 g/dL — ABNORMAL LOW (ref 13.0–17.7)
Immature Grans (Abs): 0 10*3/uL (ref 0.0–0.1)
Immature Granulocytes: 0 %
Lymphocytes Absolute: 1.4 10*3/uL (ref 0.7–3.1)
Lymphs: 19 %
MCH: 34.2 pg — ABNORMAL HIGH (ref 26.6–33.0)
MCHC: 35.3 g/dL (ref 31.5–35.7)
MCV: 97 fL (ref 79–97)
Monocytes Absolute: 0.8 10*3/uL (ref 0.1–0.9)
Monocytes: 10 %
Neutrophils Absolute: 4.9 10*3/uL (ref 1.4–7.0)
Neutrophils: 67 %
Platelets: 196 10*3/uL (ref 150–450)
RBC: 3.45 x10E6/uL — ABNORMAL LOW (ref 4.14–5.80)
RDW: 12.5 % (ref 11.6–15.4)
WBC: 7.4 10*3/uL (ref 3.4–10.8)

## 2021-10-24 LAB — LIPID PANEL
Chol/HDL Ratio: 2.6 ratio (ref 0.0–5.0)
Cholesterol, Total: 157 mg/dL (ref 100–199)
HDL: 60 mg/dL (ref >39)
LDL Chol Calc (NIH): 66 mg/dL (ref 0–99)
Triglycerides: 186 mg/dL — ABNORMAL HIGH (ref 0–149)
VLDL Cholesterol Cal: 31 mg/dL (ref 5–40)

## 2021-10-24 LAB — CMP14+EGFR
ALT: 21 IU/L (ref 0–44)
AST: 23 IU/L (ref 0–40)
Albumin/Globulin Ratio: 1.7 (ref 1.2–2.2)
Albumin: 4 g/dL (ref 3.7–4.7)
Alkaline Phosphatase: 131 IU/L — ABNORMAL HIGH (ref 44–121)
BUN/Creatinine Ratio: 13 (ref 10–24)
BUN: 32 mg/dL — ABNORMAL HIGH (ref 8–27)
Bilirubin Total: 0.6 mg/dL (ref 0.0–1.2)
CO2: 23 mmol/L (ref 20–29)
Calcium: 9.7 mg/dL (ref 8.6–10.2)
Chloride: 105 mmol/L (ref 96–106)
Creatinine, Ser: 2.53 mg/dL — ABNORMAL HIGH (ref 0.76–1.27)
Globulin, Total: 2.4 g/dL (ref 1.5–4.5)
Glucose: 98 mg/dL (ref 70–99)
Potassium: 4.3 mmol/L (ref 3.5–5.2)
Sodium: 143 mmol/L (ref 134–144)
Total Protein: 6.4 g/dL (ref 6.0–8.5)
eGFR: 26 mL/min/{1.73_m2} — ABNORMAL LOW (ref >59)

## 2021-10-25 NOTE — Progress Notes (Signed)
Hello Michael Cervantes,  Your lab result is normal and/or stable.Some minor variations that are not significant are commonly marked abnormal, but do not represent any medical problem for you.  Best regards, Keni Wafer, M.D.

## 2021-10-28 DIAGNOSIS — M9902 Segmental and somatic dysfunction of thoracic region: Secondary | ICD-10-CM | POA: Diagnosis not present

## 2021-10-28 DIAGNOSIS — S338XXA Sprain of other parts of lumbar spine and pelvis, initial encounter: Secondary | ICD-10-CM | POA: Diagnosis not present

## 2021-10-28 DIAGNOSIS — M9903 Segmental and somatic dysfunction of lumbar region: Secondary | ICD-10-CM | POA: Diagnosis not present

## 2021-10-28 DIAGNOSIS — M47816 Spondylosis without myelopathy or radiculopathy, lumbar region: Secondary | ICD-10-CM | POA: Diagnosis not present

## 2021-10-28 DIAGNOSIS — M47812 Spondylosis without myelopathy or radiculopathy, cervical region: Secondary | ICD-10-CM | POA: Diagnosis not present

## 2021-10-28 DIAGNOSIS — S233XXA Sprain of ligaments of thoracic spine, initial encounter: Secondary | ICD-10-CM | POA: Diagnosis not present

## 2021-10-28 DIAGNOSIS — M9901 Segmental and somatic dysfunction of cervical region: Secondary | ICD-10-CM | POA: Diagnosis not present

## 2021-10-30 DIAGNOSIS — M9903 Segmental and somatic dysfunction of lumbar region: Secondary | ICD-10-CM | POA: Diagnosis not present

## 2021-10-30 DIAGNOSIS — M9901 Segmental and somatic dysfunction of cervical region: Secondary | ICD-10-CM | POA: Diagnosis not present

## 2021-10-30 DIAGNOSIS — M47812 Spondylosis without myelopathy or radiculopathy, cervical region: Secondary | ICD-10-CM | POA: Diagnosis not present

## 2021-10-30 DIAGNOSIS — S233XXA Sprain of ligaments of thoracic spine, initial encounter: Secondary | ICD-10-CM | POA: Diagnosis not present

## 2021-10-30 DIAGNOSIS — M9902 Segmental and somatic dysfunction of thoracic region: Secondary | ICD-10-CM | POA: Diagnosis not present

## 2021-11-04 DIAGNOSIS — M47816 Spondylosis without myelopathy or radiculopathy, lumbar region: Secondary | ICD-10-CM | POA: Diagnosis not present

## 2021-11-04 DIAGNOSIS — M9903 Segmental and somatic dysfunction of lumbar region: Secondary | ICD-10-CM | POA: Diagnosis not present

## 2021-11-04 DIAGNOSIS — M9902 Segmental and somatic dysfunction of thoracic region: Secondary | ICD-10-CM | POA: Diagnosis not present

## 2021-11-04 DIAGNOSIS — S338XXA Sprain of other parts of lumbar spine and pelvis, initial encounter: Secondary | ICD-10-CM | POA: Diagnosis not present

## 2021-11-04 DIAGNOSIS — M47812 Spondylosis without myelopathy or radiculopathy, cervical region: Secondary | ICD-10-CM | POA: Diagnosis not present

## 2021-11-04 DIAGNOSIS — S233XXA Sprain of ligaments of thoracic spine, initial encounter: Secondary | ICD-10-CM | POA: Diagnosis not present

## 2021-11-04 DIAGNOSIS — M9901 Segmental and somatic dysfunction of cervical region: Secondary | ICD-10-CM | POA: Diagnosis not present

## 2021-11-07 DIAGNOSIS — S338XXA Sprain of other parts of lumbar spine and pelvis, initial encounter: Secondary | ICD-10-CM | POA: Diagnosis not present

## 2021-11-07 DIAGNOSIS — M9901 Segmental and somatic dysfunction of cervical region: Secondary | ICD-10-CM | POA: Diagnosis not present

## 2021-11-07 DIAGNOSIS — M47812 Spondylosis without myelopathy or radiculopathy, cervical region: Secondary | ICD-10-CM | POA: Diagnosis not present

## 2021-11-07 DIAGNOSIS — M9903 Segmental and somatic dysfunction of lumbar region: Secondary | ICD-10-CM | POA: Diagnosis not present

## 2021-11-07 DIAGNOSIS — M9902 Segmental and somatic dysfunction of thoracic region: Secondary | ICD-10-CM | POA: Diagnosis not present

## 2021-11-07 DIAGNOSIS — S233XXA Sprain of ligaments of thoracic spine, initial encounter: Secondary | ICD-10-CM | POA: Diagnosis not present

## 2021-11-07 DIAGNOSIS — M47816 Spondylosis without myelopathy or radiculopathy, lumbar region: Secondary | ICD-10-CM | POA: Diagnosis not present

## 2021-11-13 DIAGNOSIS — M9902 Segmental and somatic dysfunction of thoracic region: Secondary | ICD-10-CM | POA: Diagnosis not present

## 2021-11-13 DIAGNOSIS — M47816 Spondylosis without myelopathy or radiculopathy, lumbar region: Secondary | ICD-10-CM | POA: Diagnosis not present

## 2021-11-13 DIAGNOSIS — S338XXA Sprain of other parts of lumbar spine and pelvis, initial encounter: Secondary | ICD-10-CM | POA: Diagnosis not present

## 2021-11-13 DIAGNOSIS — M9903 Segmental and somatic dysfunction of lumbar region: Secondary | ICD-10-CM | POA: Diagnosis not present

## 2021-11-13 DIAGNOSIS — M9901 Segmental and somatic dysfunction of cervical region: Secondary | ICD-10-CM | POA: Diagnosis not present

## 2021-11-13 DIAGNOSIS — S233XXA Sprain of ligaments of thoracic spine, initial encounter: Secondary | ICD-10-CM | POA: Diagnosis not present

## 2021-11-13 DIAGNOSIS — M47812 Spondylosis without myelopathy or radiculopathy, cervical region: Secondary | ICD-10-CM | POA: Diagnosis not present

## 2021-11-14 ENCOUNTER — Other Ambulatory Visit: Payer: Self-pay | Admitting: Family Medicine

## 2021-11-14 DIAGNOSIS — K2100 Gastro-esophageal reflux disease with esophagitis, without bleeding: Secondary | ICD-10-CM

## 2021-11-19 DIAGNOSIS — M47812 Spondylosis without myelopathy or radiculopathy, cervical region: Secondary | ICD-10-CM | POA: Diagnosis not present

## 2021-11-19 DIAGNOSIS — M9903 Segmental and somatic dysfunction of lumbar region: Secondary | ICD-10-CM | POA: Diagnosis not present

## 2021-11-19 DIAGNOSIS — S233XXA Sprain of ligaments of thoracic spine, initial encounter: Secondary | ICD-10-CM | POA: Diagnosis not present

## 2021-11-19 DIAGNOSIS — S338XXA Sprain of other parts of lumbar spine and pelvis, initial encounter: Secondary | ICD-10-CM | POA: Diagnosis not present

## 2021-11-19 DIAGNOSIS — M9901 Segmental and somatic dysfunction of cervical region: Secondary | ICD-10-CM | POA: Diagnosis not present

## 2021-11-19 DIAGNOSIS — M9902 Segmental and somatic dysfunction of thoracic region: Secondary | ICD-10-CM | POA: Diagnosis not present

## 2021-11-19 DIAGNOSIS — M47816 Spondylosis without myelopathy or radiculopathy, lumbar region: Secondary | ICD-10-CM | POA: Diagnosis not present

## 2021-11-22 DIAGNOSIS — M47812 Spondylosis without myelopathy or radiculopathy, cervical region: Secondary | ICD-10-CM | POA: Diagnosis not present

## 2021-11-22 DIAGNOSIS — M47816 Spondylosis without myelopathy or radiculopathy, lumbar region: Secondary | ICD-10-CM | POA: Diagnosis not present

## 2021-11-22 DIAGNOSIS — M9902 Segmental and somatic dysfunction of thoracic region: Secondary | ICD-10-CM | POA: Diagnosis not present

## 2021-11-22 DIAGNOSIS — S338XXA Sprain of other parts of lumbar spine and pelvis, initial encounter: Secondary | ICD-10-CM | POA: Diagnosis not present

## 2021-11-22 DIAGNOSIS — M9903 Segmental and somatic dysfunction of lumbar region: Secondary | ICD-10-CM | POA: Diagnosis not present

## 2021-11-22 DIAGNOSIS — M9901 Segmental and somatic dysfunction of cervical region: Secondary | ICD-10-CM | POA: Diagnosis not present

## 2021-11-22 DIAGNOSIS — S233XXA Sprain of ligaments of thoracic spine, initial encounter: Secondary | ICD-10-CM | POA: Diagnosis not present

## 2021-11-26 DIAGNOSIS — S338XXA Sprain of other parts of lumbar spine and pelvis, initial encounter: Secondary | ICD-10-CM | POA: Diagnosis not present

## 2021-11-26 DIAGNOSIS — M47816 Spondylosis without myelopathy or radiculopathy, lumbar region: Secondary | ICD-10-CM | POA: Diagnosis not present

## 2021-11-26 DIAGNOSIS — M47812 Spondylosis without myelopathy or radiculopathy, cervical region: Secondary | ICD-10-CM | POA: Diagnosis not present

## 2021-11-26 DIAGNOSIS — M9901 Segmental and somatic dysfunction of cervical region: Secondary | ICD-10-CM | POA: Diagnosis not present

## 2021-11-26 DIAGNOSIS — S233XXA Sprain of ligaments of thoracic spine, initial encounter: Secondary | ICD-10-CM | POA: Diagnosis not present

## 2021-11-26 DIAGNOSIS — M9903 Segmental and somatic dysfunction of lumbar region: Secondary | ICD-10-CM | POA: Diagnosis not present

## 2021-11-26 DIAGNOSIS — M9902 Segmental and somatic dysfunction of thoracic region: Secondary | ICD-10-CM | POA: Diagnosis not present

## 2021-12-03 DIAGNOSIS — M9903 Segmental and somatic dysfunction of lumbar region: Secondary | ICD-10-CM | POA: Diagnosis not present

## 2021-12-03 DIAGNOSIS — M9902 Segmental and somatic dysfunction of thoracic region: Secondary | ICD-10-CM | POA: Diagnosis not present

## 2021-12-03 DIAGNOSIS — M47812 Spondylosis without myelopathy or radiculopathy, cervical region: Secondary | ICD-10-CM | POA: Diagnosis not present

## 2021-12-03 DIAGNOSIS — S233XXA Sprain of ligaments of thoracic spine, initial encounter: Secondary | ICD-10-CM | POA: Diagnosis not present

## 2021-12-03 DIAGNOSIS — M47816 Spondylosis without myelopathy or radiculopathy, lumbar region: Secondary | ICD-10-CM | POA: Diagnosis not present

## 2021-12-03 DIAGNOSIS — S338XXA Sprain of other parts of lumbar spine and pelvis, initial encounter: Secondary | ICD-10-CM | POA: Diagnosis not present

## 2021-12-03 DIAGNOSIS — M9901 Segmental and somatic dysfunction of cervical region: Secondary | ICD-10-CM | POA: Diagnosis not present

## 2021-12-05 DIAGNOSIS — M9902 Segmental and somatic dysfunction of thoracic region: Secondary | ICD-10-CM | POA: Diagnosis not present

## 2021-12-05 DIAGNOSIS — M9901 Segmental and somatic dysfunction of cervical region: Secondary | ICD-10-CM | POA: Diagnosis not present

## 2021-12-05 DIAGNOSIS — M9903 Segmental and somatic dysfunction of lumbar region: Secondary | ICD-10-CM | POA: Diagnosis not present

## 2021-12-05 DIAGNOSIS — M47816 Spondylosis without myelopathy or radiculopathy, lumbar region: Secondary | ICD-10-CM | POA: Diagnosis not present

## 2021-12-05 DIAGNOSIS — S233XXA Sprain of ligaments of thoracic spine, initial encounter: Secondary | ICD-10-CM | POA: Diagnosis not present

## 2021-12-05 DIAGNOSIS — M47812 Spondylosis without myelopathy or radiculopathy, cervical region: Secondary | ICD-10-CM | POA: Diagnosis not present

## 2021-12-05 DIAGNOSIS — S338XXA Sprain of other parts of lumbar spine and pelvis, initial encounter: Secondary | ICD-10-CM | POA: Diagnosis not present

## 2021-12-06 ENCOUNTER — Other Ambulatory Visit: Payer: Self-pay | Admitting: Family Medicine

## 2021-12-06 DIAGNOSIS — M1A3790 Chronic gout due to renal impairment, unspecified ankle and foot, without tophus (tophi): Secondary | ICD-10-CM

## 2021-12-10 DIAGNOSIS — M47812 Spondylosis without myelopathy or radiculopathy, cervical region: Secondary | ICD-10-CM | POA: Diagnosis not present

## 2021-12-10 DIAGNOSIS — M9901 Segmental and somatic dysfunction of cervical region: Secondary | ICD-10-CM | POA: Diagnosis not present

## 2021-12-10 DIAGNOSIS — S233XXA Sprain of ligaments of thoracic spine, initial encounter: Secondary | ICD-10-CM | POA: Diagnosis not present

## 2021-12-10 DIAGNOSIS — M9902 Segmental and somatic dysfunction of thoracic region: Secondary | ICD-10-CM | POA: Diagnosis not present

## 2021-12-10 DIAGNOSIS — M9903 Segmental and somatic dysfunction of lumbar region: Secondary | ICD-10-CM | POA: Diagnosis not present

## 2021-12-17 DIAGNOSIS — M9902 Segmental and somatic dysfunction of thoracic region: Secondary | ICD-10-CM | POA: Diagnosis not present

## 2021-12-17 DIAGNOSIS — M9903 Segmental and somatic dysfunction of lumbar region: Secondary | ICD-10-CM | POA: Diagnosis not present

## 2021-12-17 DIAGNOSIS — M47816 Spondylosis without myelopathy or radiculopathy, lumbar region: Secondary | ICD-10-CM | POA: Diagnosis not present

## 2021-12-17 DIAGNOSIS — M9901 Segmental and somatic dysfunction of cervical region: Secondary | ICD-10-CM | POA: Diagnosis not present

## 2021-12-17 DIAGNOSIS — S338XXA Sprain of other parts of lumbar spine and pelvis, initial encounter: Secondary | ICD-10-CM | POA: Diagnosis not present

## 2021-12-17 DIAGNOSIS — M47812 Spondylosis without myelopathy or radiculopathy, cervical region: Secondary | ICD-10-CM | POA: Diagnosis not present

## 2021-12-17 DIAGNOSIS — S233XXA Sprain of ligaments of thoracic spine, initial encounter: Secondary | ICD-10-CM | POA: Diagnosis not present

## 2021-12-18 ENCOUNTER — Ambulatory Visit (INDEPENDENT_AMBULATORY_CARE_PROVIDER_SITE_OTHER): Payer: Medicare Other

## 2021-12-18 VITALS — Wt 184.0 lb

## 2021-12-18 DIAGNOSIS — Z0000 Encounter for general adult medical examination without abnormal findings: Secondary | ICD-10-CM | POA: Diagnosis not present

## 2021-12-18 NOTE — Progress Notes (Signed)
Subjective:   Michael Cervantes is a 76 y.o. male who presents for Medicare Annual/Subsequent preventive examination.  Virtual Visit via Telephone Note  I connected with  Michael Cervantes on 12/18/21 at  9:00 AM EDT by telephone and verified that I am speaking with the correct person using two identifiers.  Location: Patient: Home Provider: [facility] Persons participating in the virtual visit: patient/Nurse Health Advisor   I discussed the limitations, risks, security and privacy concerns of performing an evaluation and management service by telephone and the availability of in person appointments. The patient expressed understanding and agreed to proceed.  Interactive audio and video telecommunications were attempted between this nurse and patient, however failed, due to patient having technical difficulties OR patient did not have access to video capability.  We continued and completed visit with audio only.  Some vital signs may be absent or patient reported.   Ashyra Cantin E Muskaan Smet, LPN   Review of Systems     Cardiac Risk Factors include: advanced age (>55mn, >>81women);male gender;dyslipidemia;hypertension;Other (see comment), Risk factor comments: chronic renal failure     Objective:    Today's Vitals   12/18/21 0906  Weight: 184 lb (83.5 kg)  PainSc: 2    Body mass index is 26.4 kg/m.     12/18/2021    9:14 AM 12/17/2020    9:30 AM 11/02/2019   10:45 AM 03/18/2016   10:36 AM 11/22/2014   10:29 AM 09/12/2014   12:34 PM  Advanced Directives  Does Patient Have a Medical Advance Directive? No No No Yes No No  Type of AScientist, research (medical)Living will    Does patient want to make changes to medical advance directive?    No - Patient declined    Copy of [place] Chart?    No - copy requested    Would patient like information on creating a medical advance directive? No - Patient declined Yes (MAU/Ambulatory/Procedural Areas - Information  given) No - Patient declined  Yes - Educational materials given No - patient declined information    Current Medications (verified) Outpatient Encounter Medications as of 12/18/2021  Medication Sig   allopurinol (ZYLOPRIM) 100 MG tablet TAKE 2 TABLETS BY MOUTH EVERY DAY   atorvastatin (LIPITOR) 40 MG tablet TAKE 1 TABLET BY MOUTH EVERY DAY FOR CHOLESTEROL   famotidine (PEPCID) 40 MG tablet Take 1 tablet (40 mg total) by mouth daily.   furosemide (LASIX) 40 MG tablet Take 1 tablet (40 mg total) by mouth daily.   Multiple Vitamins-Minerals (ONE-A-DAY MENS HEALTH FORMULA PO) Take 1 tablet by mouth daily.   pantoprazole (PROTONIX) 40 MG tablet TAKE 1 TABLET BY MOUTH EVERY DAY   sodium bicarbonate 650 MG tablet Take 650 mg by mouth 2 (two) times daily.   valsartan (DIOVAN) 160 MG tablet Take 1 tablet (160 mg total) by mouth daily.   colchicine 0.6 MG tablet TAKE 2 TABS BY MOUTH when  Gout flares up THEN 1 AN HOUR LATER, THEN 1 TWICE A DAY UNTIL COMPLETE RELIEF THEN 1 DAILY (Patient not taking: Reported on 12/18/2021)   Triamcinolone Acetonide (TRIAMCINOLONE 0.1 % CREAM : EUCERIN) CREA Apply 1 application topically 3 (three) times daily as needed. (Patient not taking: Reported on 12/18/2021)   [DISCONTINUED] Vitamin D, Ergocalciferol, (DRISDOL) 1.25 MG (50000 UNIT) CAPS capsule Take 1 capsule (50,000 Units total) by mouth every 7 (seven) days. (Patient not taking: Reported on 12/18/2021)   No facility-administered  encounter medications on file as of 12/18/2021.    Allergies (verified) Cefaclor   History: Past Medical History:  Diagnosis Date   Anemia    Arthritis    Bulging lumbar disc    Cellulitis 08/2014   rt foot   Gout    Hyperlipidemia    OA (osteoarthritis) of foot    rt foot   Polycystic kidney disease    Sciatica 2016   Past Surgical History:  Procedure Laterality Date   CYST EXCISION     tail bone   TONSILLECTOMY     Family History  Problem Relation Age of Onset    Dementia Mother    Alzheimer's disease Mother    Cancer Father        lymphoma met to brain   Arthritis Sister    Social History   Socioeconomic History   Marital status: Married    Spouse name: Sharyn Lull   Number of children: 2   Years of education: Not on file   Highest education level: Some college, no degree  Occupational History   Occupation: Retired  Tobacco Use   Smoking status: Former    Packs/day: 1.00    Years: 35.00    Pack years: 35.00    Types: Cigarettes    Quit date: 07/28/1989    Years since quitting: 32.4   Smokeless tobacco: Current    Types: Snuff  Vaping Use   Vaping Use: Never used  Substance and Sexual Activity   Alcohol use: Yes    Alcohol/week: 2.0 standard drinks    Types: 2 Cans of beer per week    Comment: socially   Drug use: No   Sexual activity: Yes  Other Topics Concern   Not on file  Social History Narrative   Lives with wife. Wife's daughter lives in [place].   Social Determinants of Health   Financial Resource Strain: Low Risk    Difficulty of Paying Living Expenses: Not hard at all  Food Insecurity: No Food Insecurity   Worried About Charity fundraiser in the Last Year: Never true   [place] in the Last Year: Never true  Transportation Needs: No Transportation Needs   Lack of Transportation (Medical): No   Lack of Transportation (Non-Medical): No  Physical Activity: Sufficiently Active   Days of Exercise per Week: 7 days   Minutes of Exercise per Session: 40 min  Stress: No Stress Concern Present   Feeling of Stress : Not at all  Social Connections: Socially Integrated   Frequency of Communication with Friends and Family: More than three times a week   Frequency of Social Gatherings with Friends and Family: More than three times a week   Attends Religious Services: More than 4 times per year   Active Member of [employer] or Organizations: Yes   Attends Music therapist: More than 4 times per year   Marital  Status: Married    Tobacco Counseling Ready to quit: Not Answered Counseling given: Not Answered   Clinical Intake:  Pre-visit preparation completed: Yes  Pain : 0-10 Pain Score: 2  Pain Type: Chronic pain Pain Location: Neck Pain Orientation: Posterior Pain Descriptors / Indicators: Aching Pain Onset: More than a month ago Pain Frequency: Intermittent     BMI - recorded: 26.4 Nutritional Status: BMI 25 -29 Overweight Nutritional Risks: None Diabetes: No  How often do you need to have someone help you when you read instructions, pamphlets, or other written materials from  your doctor or pharmacy?: 1 - Never  Diabetic? no  Interpreter Needed?: No  Information entered by :: Jamika Sadek, LPN   Activities of Daily Living    12/18/2021    9:11 AM  In your present state of health, do you have any difficulty performing the following activities:  Hearing? 1  Vision? 0  Difficulty concentrating or making decisions? 1  Walking or climbing stairs? 0  Dressing or bathing? 0  Doing errands, shopping? 0  Preparing Food and eating ? N  Using the Toilet? N  In the past six months, have you accidently leaked urine? N  Do you have problems with loss of bowel control? N  Managing your Medications? N  Managing your Finances? N  Housekeeping or managing your Housekeeping? N    Patient Care Team: Claretta Fraise, MD as PCP - General (Family Medicine) Rocco, Micheal Likens, MD as Referring Physician (Nephrology) Harlen Labs, MD as Referring Physician (Optometry)  Indicate any recent Medical Services you may have received from other than [facility] providers in the past year (date may be approximate).     Assessment:   This is a routine wellness examination for Fines.  Hearing/Vision screen Hearing Screening - Comments:: Wears hearing aids - from [facility] in [place] - Comments:: Wears rx glasses - up to date with routine eye exams with [facility]  in [place] issues and exercise activities discussed: Current Exercise Habits: Home exercise routine, Type of exercise: walking, Time (Minutes): 40, Frequency (Times/Week): 7, Weekly Exercise (Minutes/Week): 280, Intensity: Mild, Exercise limited by: None identified   Goals Addressed             This Visit's Progress    DIET - EAT MORE FRUITS AND VEGETABLES   On track    Exercise 3x per week (30 min per time)   On track    12/18/2021 - patient hopes to maintain his independence, stay active and healthy        Depression Screen    12/18/2021    9:10 AM 10/23/2021    9:45 AM 04/25/2021   10:57 AM 01/18/2021   11:09 AM 12/17/2020    9:30 AM 10/22/2020   10:11 AM 04/24/2020   10:02 AM  PHQ 2/9 Scores  PHQ - 2 Score 2 0 0 0 0 0 0  PHQ- 9 Score 6          Fall Risk    12/18/2021    9:06 AM 10/23/2021    9:48 AM 10/23/2021    9:45 AM 04/25/2021   11:01 AM 04/25/2021   10:57 AM  Fall Risk   Falls in the past year? 1 1 0 1 0  Number falls in past yr: 1 0  0   Injury with Fall? 1 0  0   Risk for fall due to : History of fall(s);Impaired balance/gait;Orthopedic patient History of fall(s);Impaired balance/gait  History of fall(s)   Follow up Education provided;Falls prevention discussed Falls evaluation completed  Falls evaluation completed     FALL RISK PREVENTION PERTAINING TO THE HOME:  Any stairs in or around the home? No  If so, are there any without handrails? No  Home free of loose throw rugs in walkways, pet beds, electrical cords, etc? Yes  Adequate lighting in your home to reduce risk of falls? Yes   ASSISTIVE DEVICES UTILIZED TO PREVENT FALLS:  Life alert? No  Use of a cane, walker or w/c? No  Grab bars in the bathroom? Yes  Shower chair or bench in shower? Yes  Elevated toilet seat or a handicapped toilet? No   TIMED UP AND GO:  Was the test performed? No . Telephonic visit  Cognitive Function:    03/25/2017   10:52 AM 03/18/2016   10:42 AM 11/22/2014    10:34 AM  MMSE - Mini Mental State Exam  Orientation to time _0 Orientation to Place _1 Registration _2 Attention/ Calculation _3 Recall _4 Language- name 2 objects _5 Language- repeat _6 Language- follow 3 step command _7 Language- read & follow direction _8 Write a sentence _9 Copy design _10 Total score _11 12/18/2021    9:12 AM 11/02/2019   10:47 AM  6CIT Screen  What Year? 0 points 0 points  What month? 0 points 0 points  What time? 0 points 0 points  Count back from 20 0 points 0 points  Months in reverse 0 points 0 points  Repeat phrase 0 points 0 points  Total Score 0 points 0 points    Immunizations Immunization History  Administered Date(s) Administered   Fluad Quad(high Dose 65+) 04/28/2019, 04/25/2021   Influenza, High Dose Seasonal PF 04/23/2016, 04/23/2016, 05/03/2018, 04/28/2019, 04/12/2020   Influenza,inj,Quad PF,6+ Mos 06/05/2014, 06/18/2015, 04/24/2017   Influenza-Unspecified 06/02/2013, 06/05/2014, 06/18/2015, 05/26/2016, 04/24/2017, 05/07/2017   Moderna Sars-Covid-2 Vaccination 09/21/2019, 10/19/2019, 06/20/2020   Pneumococcal Conjugate-13 10/19/2014, 10/19/2014, 06/12/2015   Pneumococcal Polysaccharide-23 07/28/2005, 05/03/2018   Pneumococcal-Unspecified 07/28/2005, 05/03/2018   Tdap 12/13/2004, 10/23/2021   Zoster Recombinat (Shingrix) 04/25/2021, 10/23/2021   Zoster, Live 06/18/2015, 06/18/2015    TDAP status: Up to date  Flu Vaccine status: Up to date  Pneumococcal vaccine status: Up to date  Covid-19 vaccine status: Completed vaccines  Qualifies for Shingles Vaccine? Yes   Zostavax completed Yes   Shingrix Completed?: Yes  Screening Tests Health Maintenance  Topic Date Due   COVID-19 Vaccine (4 - Booster for Moderna series) 08/15/2020   INFLUENZA VACCINE  02/25/2022   TETANUS/TDAP  10/24/2031   Pneumonia Vaccine 4+ Years old  Completed   Hepatitis C Screening  Completed    Zoster Vaccines- Shingrix  Completed   HPV VACCINES  Aged Out   COLONOSCOPY (Pts 45-21yr Insurance coverage will need to be confirmed)  DShalimarMaintenance Due  Topic Date Due   COVID-19 Vaccine (4 - Booster for Moderna series) 08/15/2020    Colorectal cancer screening: No longer required.   Lung Cancer Screening: (Low Dose CT Chest recommended if Age 77-80years, 30 pack-year currently smoking OR have quit w/in 15years.) does not qualify.  Additional Screening:  Hepatitis C Screening: does qualify; Completed 04/23/2016  Vision Screening: Recommended annual ophthalmology exams for early detection of glaucoma and other disorders of the eye. Is the patient up to date with their annual eye exam?  Yes  Who is the provider or what is the name of the office in which the patient attends annual eye exams[place] is not established with a provider, would they like to be referred to a provider to establish care? No .   Dental Screening: Recommended annual dental exams for proper oral hygiene  Community Resource Referral / Chronic Care Management:  CRR required this visit?  No   CCM required this visit?  No      Plan:     I have personally reviewed and noted the following in the patient's chart:   Medical and social history Use of alcohol, tobacco or illicit drugs  Current medications and supplements including opioid prescriptions. Patient is not currently taking opioid prescriptions. Functional ability and status Nutritional status Physical activity Advanced directives List of other physicians Hospitalizations, surgeries, and ER visits in previous 12 months Vitals Screenings to include cognitive, depression, and falls Referrals and appointments  In addition, I have reviewed and discussed with patient certain preventive protocols, quality metrics, and best practice recommendations. A written personalized care plan for  preventive services as well as general preventive health recommendations were provided to patient.     Sandrea Hammond, LPN   6/71/2458   Nurse Notes: None

## 2021-12-18 NOTE — Patient Instructions (Signed)
[name] , Thank you for taking time to come for your Medicare Wellness Visit. I appreciate your ongoing commitment to your health goals. Please review the following plan we discussed and let me know if I can assist you in the future.   Screening recommendations/referrals: Colonoscopy: Done 10/25/2015 - no repeat Recommended yearly ophthalmology/optometry visit for glaucoma screening and checkup Recommended yearly dental visit for hygiene and checkup  Vaccinations: Influenza vaccine: Done 04/25/2021 - Repeat annually  Pneumococcal vaccine: Done 06/12/2015 & 05/03/2018   Tdap vaccine: Done 10/23/2021 - Repeat in 10 years Shingles vaccine: Done 04/25/2021 & 10/23/2021 Covid-19: Done 09/21/2019,10/19/2019, & 06/20/2020   Advanced directives: Advance directive discussed with you today. Even though you declined this today, please call our office should you change your mind, and we can give you the proper paperwork for you to fill out.   Conditions/risks identified: Keep up the great work! Aim for 30 minutes of exercise or brisk walking, 6-8 glasses of water, and 5 servings of fruits and vegetables each day.   Next appointment: Follow up in one year for your annual wellness visit.   Preventive Care 38 Years and Older, Male  Preventive care refers to lifestyle choices and visits with your health care provider that can promote health and wellness. What does preventive care include? A yearly physical exam. This is also called an annual well check. Dental exams once or twice a year. Routine eye exams. Ask your health care provider how often you should have your eyes checked. Personal lifestyle choices, including: Daily care of your teeth and gums. Regular physical activity. Eating a healthy diet. Avoiding tobacco and drug use. Limiting alcohol use. Practicing safe sex. Taking low doses of aspirin every day. Taking vitamin and mineral supplements as recommended by your health care provider. What  happens during an annual well check? The services and screenings done by your health care provider during your annual well check will depend on your age, overall health, lifestyle risk factors, and family history of disease. Counseling  Your health care provider may ask you questions about your: Alcohol use. Tobacco use. Drug use. Emotional well-being. Home and relationship well-being. Sexual activity. Eating habits. History of falls. Memory and ability to understand (cognition). Work and work Statistician. Screening  You may have the following tests or measurements: Height, weight, and BMI. Blood pressure. Lipid and cholesterol levels. These may be checked every 5 years, or more frequently if you are over 77 years old. Skin check. Lung cancer screening. You may have this screening every year starting at age 77 if you have a 30-pack-year history of smoking and currently smoke or have quit within the past 15 years. Fecal occult blood test (FOBT) of the stool. You may have this test every year starting at age 77. Flexible sigmoidoscopy or colonoscopy. You may have a sigmoidoscopy every 5 years or a colonoscopy every 10 years starting at age 77. Prostate cancer screening. Recommendations will vary depending on your family history and other risks. Hepatitis C blood test. Hepatitis B blood test. Sexually transmitted disease (STD) testing. Diabetes screening. This is done by checking your blood sugar (glucose) after you have not eaten for a while (fasting). You may have this done every 1-3 years. Abdominal aortic aneurysm (AAA) screening. You may need this if you are a current or former smoker. Osteoporosis. You may be screened starting at age 77 if you are at high risk. Talk with your health care provider about your test results, treatment options, and  if necessary, the need for more tests. Vaccines  Your health care provider may recommend certain vaccines, such as: Influenza vaccine. This  is recommended every year. Tetanus, diphtheria, and acellular pertussis (Tdap, Td) vaccine. You may need a Td booster every 10 years. Zoster vaccine. You may need this after age 77. Pneumococcal 13-valent conjugate (PCV13) vaccine. One dose is recommended after age 77. Pneumococcal polysaccharide (PPSV23) vaccine. One dose is recommended after age 77. Talk to your health care provider about which screenings and vaccines you need and how often you need them. This information is not intended to replace advice given to you by your health care provider. Make sure you discuss any questions you have with your health care provider. Document Released: 08/10/2015 Document Revised: 04/02/2016 Document Reviewed: 05/15/2015 Elsevier Interactive Patient Education  2017 [place] Prevention in the Home Falls can cause injuries. They can happen to people of all ages. There are many things you can do to make your home safe and to help prevent falls. What can I do on the outside of my home? Regularly fix the edges of walkways and driveways and fix any cracks. Remove anything that might make you trip as you walk through a door, such as a raised step or threshold. Trim any bushes or trees on the path to your home. Use bright outdoor lighting. Clear any walking paths of anything that might make someone trip, such as rocks or tools. Regularly check to see if handrails are loose or broken. Make sure that both sides of any steps have handrails. Any raised decks and porches should have guardrails on the edges. Have any leaves, snow, or ice cleared regularly. Use sand or salt on walking paths during winter. Clean up any spills in your garage right away. This includes oil or grease spills. What can I do in the bathroom? Use night lights. Install grab bars by the toilet and in the tub and shower. Do not use towel bars as grab bars. Use non-skid mats or decals in the tub or shower. If you need to sit down  in the shower, use a plastic, non-slip stool. Keep the floor dry. Clean up any water that spills on the floor as soon as it happens. Remove soap buildup in the tub or shower regularly. Attach bath mats securely with double-sided non-slip rug tape. Do not have throw rugs and other things on the floor that can make you trip. What can I do in the bedroom? Use night lights. Make sure that you have a light by your bed that is easy to reach. Do not use any sheets or blankets that are too big for your bed. They should not hang down onto the floor. Have a firm chair that has side arms. You can use this for support while you get dressed. Do not have throw rugs and other things on the floor that can make you trip. What can I do in the kitchen? Clean up any spills right away. Avoid walking on wet floors. Keep items that you use a lot in easy-to-reach places. If you need to reach something above you, use a strong step stool that has a grab bar. Keep electrical cords out of the way. Do not use floor polish or wax that makes floors slippery. If you must use wax, use non-skid floor wax. Do not have throw rugs and other things on the floor that can make you trip. What can I do with my stairs? Do not leave any  items on the stairs. Make sure that there are handrails on both sides of the stairs and use them. Fix handrails that are broken or loose. Make sure that handrails are as long as the stairways. Check any carpeting to make sure that it is firmly attached to the stairs. Fix any carpet that is loose or worn. Avoid having throw rugs at the top or bottom of the stairs. If you do have throw rugs, attach them to the floor with carpet tape. Make sure that you have a light switch at the top of the stairs and the bottom of the stairs. If you do not have them, ask someone to add them for you. What else can I do to help prevent falls? Wear shoes that: Do not have high heels. Have rubber bottoms. Are comfortable  and fit you well. Are closed at the toe. Do not wear sandals. If you use a stepladder: Make sure that it is fully opened. Do not climb a closed stepladder. Make sure that both sides of the stepladder are locked into place. Ask someone to hold it for you, if possible. Clearly mark and make sure that you can see: Any grab bars or handrails. First and last steps. Where the edge of each step is. Use tools that help you move around (mobility aids) if they are needed. These include: Canes. Walkers. Scooters. Crutches. Turn on the lights when you go into a dark area. Replace any light bulbs as soon as they burn out. Set up your furniture so you have a clear path. Avoid moving your furniture around. If any of your floors are uneven, fix them. If there are any pets around you, be aware of where they are. Review your medicines with your doctor. Some medicines can make you feel dizzy. This can increase your chance of falling. Ask your doctor what other things that you can do to help prevent falls. This information is not intended to replace advice given to you by your health care provider. Make sure you discuss any questions you have with your health care provider. Document Released: 05/10/2009 Document Revised: 12/20/2015 Document Reviewed: 08/18/2014 Elsevier Interactive Patient Education  2017 [employer].

## 2021-12-31 DIAGNOSIS — M47812 Spondylosis without myelopathy or radiculopathy, cervical region: Secondary | ICD-10-CM | POA: Diagnosis not present

## 2021-12-31 DIAGNOSIS — S233XXA Sprain of ligaments of thoracic spine, initial encounter: Secondary | ICD-10-CM | POA: Diagnosis not present

## 2021-12-31 DIAGNOSIS — S338XXA Sprain of other parts of lumbar spine and pelvis, initial encounter: Secondary | ICD-10-CM | POA: Diagnosis not present

## 2021-12-31 DIAGNOSIS — M9901 Segmental and somatic dysfunction of cervical region: Secondary | ICD-10-CM | POA: Diagnosis not present

## 2021-12-31 DIAGNOSIS — M47816 Spondylosis without myelopathy or radiculopathy, lumbar region: Secondary | ICD-10-CM | POA: Diagnosis not present

## 2021-12-31 DIAGNOSIS — M9902 Segmental and somatic dysfunction of thoracic region: Secondary | ICD-10-CM | POA: Diagnosis not present

## 2021-12-31 DIAGNOSIS — M9903 Segmental and somatic dysfunction of lumbar region: Secondary | ICD-10-CM | POA: Diagnosis not present

## 2022-01-29 DIAGNOSIS — M9903 Segmental and somatic dysfunction of lumbar region: Secondary | ICD-10-CM | POA: Diagnosis not present

## 2022-01-29 DIAGNOSIS — M9901 Segmental and somatic dysfunction of cervical region: Secondary | ICD-10-CM | POA: Diagnosis not present

## 2022-01-29 DIAGNOSIS — M47812 Spondylosis without myelopathy or radiculopathy, cervical region: Secondary | ICD-10-CM | POA: Diagnosis not present

## 2022-01-29 DIAGNOSIS — M9902 Segmental and somatic dysfunction of thoracic region: Secondary | ICD-10-CM | POA: Diagnosis not present

## 2022-01-29 DIAGNOSIS — M47816 Spondylosis without myelopathy or radiculopathy, lumbar region: Secondary | ICD-10-CM | POA: Diagnosis not present

## 2022-01-29 DIAGNOSIS — S233XXA Sprain of ligaments of thoracic spine, initial encounter: Secondary | ICD-10-CM | POA: Diagnosis not present

## 2022-01-29 DIAGNOSIS — S338XXA Sprain of other parts of lumbar spine and pelvis, initial encounter: Secondary | ICD-10-CM | POA: Diagnosis not present

## 2022-02-03 DIAGNOSIS — I129 Hypertensive chronic kidney disease with stage 1 through stage 4 chronic kidney disease, or unspecified chronic kidney disease: Secondary | ICD-10-CM | POA: Diagnosis not present

## 2022-02-03 DIAGNOSIS — D631 Anemia in chronic kidney disease: Secondary | ICD-10-CM | POA: Diagnosis not present

## 2022-02-03 DIAGNOSIS — Z79811 Long term (current) use of aromatase inhibitors: Secondary | ICD-10-CM | POA: Diagnosis not present

## 2022-02-03 DIAGNOSIS — N2581 Secondary hyperparathyroidism of renal origin: Secondary | ICD-10-CM | POA: Diagnosis not present

## 2022-02-03 DIAGNOSIS — N184 Chronic kidney disease, stage 4 (severe): Secondary | ICD-10-CM | POA: Diagnosis not present

## 2022-02-03 DIAGNOSIS — Z8616 Personal history of COVID-19: Secondary | ICD-10-CM | POA: Diagnosis not present

## 2022-02-03 DIAGNOSIS — E785 Hyperlipidemia, unspecified: Secondary | ICD-10-CM | POA: Diagnosis not present

## 2022-02-03 DIAGNOSIS — Z79899 Other long term (current) drug therapy: Secondary | ICD-10-CM | POA: Diagnosis not present

## 2022-02-12 DIAGNOSIS — S233XXA Sprain of ligaments of thoracic spine, initial encounter: Secondary | ICD-10-CM | POA: Diagnosis not present

## 2022-02-12 DIAGNOSIS — M47812 Spondylosis without myelopathy or radiculopathy, cervical region: Secondary | ICD-10-CM | POA: Diagnosis not present

## 2022-02-12 DIAGNOSIS — S338XXA Sprain of other parts of lumbar spine and pelvis, initial encounter: Secondary | ICD-10-CM | POA: Diagnosis not present

## 2022-02-12 DIAGNOSIS — M9901 Segmental and somatic dysfunction of cervical region: Secondary | ICD-10-CM | POA: Diagnosis not present

## 2022-02-12 DIAGNOSIS — M47816 Spondylosis without myelopathy or radiculopathy, lumbar region: Secondary | ICD-10-CM | POA: Diagnosis not present

## 2022-02-12 DIAGNOSIS — M9903 Segmental and somatic dysfunction of lumbar region: Secondary | ICD-10-CM | POA: Diagnosis not present

## 2022-02-12 DIAGNOSIS — M9902 Segmental and somatic dysfunction of thoracic region: Secondary | ICD-10-CM | POA: Diagnosis not present

## 2022-03-12 DIAGNOSIS — M9902 Segmental and somatic dysfunction of thoracic region: Secondary | ICD-10-CM | POA: Diagnosis not present

## 2022-03-12 DIAGNOSIS — M9901 Segmental and somatic dysfunction of cervical region: Secondary | ICD-10-CM | POA: Diagnosis not present

## 2022-03-12 DIAGNOSIS — M47816 Spondylosis without myelopathy or radiculopathy, lumbar region: Secondary | ICD-10-CM | POA: Diagnosis not present

## 2022-03-12 DIAGNOSIS — M47812 Spondylosis without myelopathy or radiculopathy, cervical region: Secondary | ICD-10-CM | POA: Diagnosis not present

## 2022-03-12 DIAGNOSIS — S338XXA Sprain of other parts of lumbar spine and pelvis, initial encounter: Secondary | ICD-10-CM | POA: Diagnosis not present

## 2022-03-12 DIAGNOSIS — S233XXA Sprain of ligaments of thoracic spine, initial encounter: Secondary | ICD-10-CM | POA: Diagnosis not present

## 2022-03-12 DIAGNOSIS — M9903 Segmental and somatic dysfunction of lumbar region: Secondary | ICD-10-CM | POA: Diagnosis not present

## 2022-04-09 DIAGNOSIS — M47812 Spondylosis without myelopathy or radiculopathy, cervical region: Secondary | ICD-10-CM | POA: Diagnosis not present

## 2022-04-09 DIAGNOSIS — M9901 Segmental and somatic dysfunction of cervical region: Secondary | ICD-10-CM | POA: Diagnosis not present

## 2022-04-09 DIAGNOSIS — S338XXA Sprain of other parts of lumbar spine and pelvis, initial encounter: Secondary | ICD-10-CM | POA: Diagnosis not present

## 2022-04-09 DIAGNOSIS — M47816 Spondylosis without myelopathy or radiculopathy, lumbar region: Secondary | ICD-10-CM | POA: Diagnosis not present

## 2022-04-09 DIAGNOSIS — M9903 Segmental and somatic dysfunction of lumbar region: Secondary | ICD-10-CM | POA: Diagnosis not present

## 2022-04-09 DIAGNOSIS — S233XXA Sprain of ligaments of thoracic spine, initial encounter: Secondary | ICD-10-CM | POA: Diagnosis not present

## 2022-04-09 DIAGNOSIS — M9902 Segmental and somatic dysfunction of thoracic region: Secondary | ICD-10-CM | POA: Diagnosis not present

## 2022-04-28 ENCOUNTER — Ambulatory Visit: Payer: Medicare Other | Admitting: Family Medicine

## 2022-04-28 ENCOUNTER — Encounter: Payer: Self-pay | Admitting: Family Medicine

## 2022-04-28 ENCOUNTER — Ambulatory Visit (INDEPENDENT_AMBULATORY_CARE_PROVIDER_SITE_OTHER): Payer: Medicare Other | Admitting: Family Medicine

## 2022-04-28 VITALS — BP 117/77 | HR 102 | Temp 97.7°F | Ht 70.0 in | Wt 181.6 lb

## 2022-04-28 DIAGNOSIS — Z23 Encounter for immunization: Secondary | ICD-10-CM | POA: Diagnosis not present

## 2022-04-28 DIAGNOSIS — K2100 Gastro-esophageal reflux disease with esophagitis, without bleeding: Secondary | ICD-10-CM

## 2022-04-28 DIAGNOSIS — I129 Hypertensive chronic kidney disease with stage 1 through stage 4 chronic kidney disease, or unspecified chronic kidney disease: Secondary | ICD-10-CM

## 2022-04-28 DIAGNOSIS — N183 Chronic kidney disease, stage 3 unspecified: Secondary | ICD-10-CM | POA: Diagnosis not present

## 2022-04-28 DIAGNOSIS — E782 Mixed hyperlipidemia: Secondary | ICD-10-CM | POA: Diagnosis not present

## 2022-04-28 DIAGNOSIS — Q613 Polycystic kidney, unspecified: Secondary | ICD-10-CM

## 2022-04-28 DIAGNOSIS — E559 Vitamin D deficiency, unspecified: Secondary | ICD-10-CM | POA: Diagnosis not present

## 2022-04-28 MED ORDER — PANTOPRAZOLE SODIUM 40 MG PO TBEC: 40.0000 mg | DELAYED_RELEASE_TABLET | Freq: Every day | ORAL | 3 refills | Status: DC

## 2022-04-28 NOTE — Progress Notes (Signed)
Subjective:  Patient ID: Michael Cervantes, male    DOB: Jan 09, 1946  Age: 76 y.o. MRN: [id number]  CC: Medical Management of Chronic Issues   HPI DELRAY REZA presents for  follow-up of hypertension. Patient has no history of headache chest pain or shortness of breath or recent cough. Patient also denies symptoms of TIA such as focal numbness or weakness. Patient denies side effects from medication. States taking it regularly. Treated by neprology for Polycystic kdiney dx. Due for BMP.   in for follow-up of elevated cholesterol. Doing well without complaints on current medication. Denies side effects of statin including myalgia and arthralgia and nausea. Currently no chest pain, shortness of breath or other cardiovascular related symptoms noted.  Patient in for follow-up of GERD. Currently asymptomatic taking  famotidine daily. There is no chest pain or heartburn. No hematemesis and no melena. No dysphagia or choking. Onset is remote. Progression is stable. Complicating factors, none.  History Jamael has a past medical history of Anemia, Arthritis, Bulging lumbar disc, Cellulitis (08/2014), Gout, Hyperlipidemia, OA (osteoarthritis) of foot, Polycystic kidney disease, and Sciatica (2016).   He has a past surgical history that includes Cyst excision and Tonsillectomy.   His family history includes Alzheimer's disease in his mother; Arthritis in his sister; Cancer in his father; Dementia in his mother.He reports that he quit smoking about 32 years ago. His smoking use included cigarettes. He has a 35.00 pack-year smoking history. His smokeless tobacco use includes snuff. He reports current alcohol use of about 2.0 standard drinks of alcohol per week. He reports that he does not use drugs.  Current Outpatient Medications on File Prior to Visit  Medication Sig Dispense Refill   allopurinol (ZYLOPRIM) 100 MG tablet TAKE 2 TABLETS BY MOUTH EVERY DAY 180 tablet 1   atorvastatin (LIPITOR) 40 MG tablet  TAKE 1 TABLET BY MOUTH EVERY DAY FOR CHOLESTEROL 90 tablet 3   colchicine 0.6 MG tablet TAKE 2 TABS BY MOUTH when  Gout flares up THEN 1 AN HOUR LATER, THEN 1 TWICE A DAY UNTIL COMPLETE RELIEF THEN 1 DAILY 40 tablet 3   famotidine (PEPCID) 40 MG tablet Take 1 tablet (40 mg total) by mouth daily. 90 tablet 3   furosemide (LASIX) 40 MG tablet Take 1 tablet (40 mg total) by mouth daily. 90 tablet 2   Multiple Vitamins-Minerals (ONE-A-DAY MENS HEALTH FORMULA PO) Take 1 tablet by mouth daily.     sodium bicarbonate 650 MG tablet Take 650 mg by mouth 2 (two) times daily.     Triamcinolone Acetonide (TRIAMCINOLONE 0.1 % CREAM : EUCERIN) CREA Apply 1 application topically 3 (three) times daily as needed. 1 each 5   valsartan (DIOVAN) 160 MG tablet Take 1 tablet (160 mg total) by mouth daily. 90 tablet 3   No current facility-administered medications on file prior to visit.    ROS Review of Systems  Constitutional:  Negative for fever.  Respiratory:  Negative for shortness of breath.   Cardiovascular:  Negative for chest pain.  Genitourinary:  Positive for flank pain (right flank pain better for about a week.).  Musculoskeletal:  Negative for arthralgias.  Skin:  Negative for rash.  Neurological:        Burning in feet   All other systems reviewed and are negative.   Objective:  BP 117/77   Pulse (!) 102   Temp 97.7 F (36.5 C)   Ht 5' 10" (1.778 m)   Wt 181 lb 9.6 oz (82.4  kg)   SpO2 97%   BMI 26.06 kg/m   BP Readings from Last 3 Encounters:  04/29/22 117/77  10/23/21 123/73  04/25/21 109/66    Wt Readings from Last 3 Encounters:  04/29/22 181 lb 9.6 oz (82.4 kg)  12/18/21 184 lb (83.5 kg)  10/23/21 184 lb 12.8 oz (83.8 kg)     Physical Exam Vitals reviewed.  Constitutional:      Appearance: He is well-developed.  HENT:     Head: Normocephalic and atraumatic.     Right Ear: External ear normal.     Left Ear: External ear normal.     Mouth/Throat:     Pharynx: No  oropharyngeal exudate or posterior oropharyngeal erythema.  Eyes:     Pupils: Pupils are equal, round, and reactive to light.  Cardiovascular:     Rate and Rhythm: Normal rate and regular rhythm.     Heart sounds: No murmur heard. Pulmonary:     Effort: No respiratory distress.     Breath sounds: Normal breath sounds.  Musculoskeletal:     Cervical back: Normal range of motion and neck supple.  Neurological:     Mental Status: He is alert and oriented to person, place, and time.       Assessment & Plan:   Blessed was seen today for medical management of chronic issues.  Diagnoses and all orders for this visit:  Benign hypertension with chronic kidney disease, stage III (HCC) -     CBC with Differential/Platelet -     CMP14+EGFR  Mixed hyperlipidemia -     Lipid panel  Vitamin D deficiency -     VITAMIN D 25 Hydroxy (Vit-D Deficiency, Fractures)  Gastroesophageal reflux disease with esophagitis without hemorrhage -     pantoprazole (PROTONIX) 40 MG tablet; Take 1 tablet (40 mg total) by mouth daily.  Need for influenza vaccination -     Flu Vaccine QUAD High Dose(Fluad)  Polycystic kidney disease   Allergies as of 04/28/2022       Reactions   Cefaclor Rash        Medication List        Accurate as of April 28, 2022 11:59 PM. If you have any questions, ask your nurse or doctor.          allopurinol 100 MG tablet Commonly known as: ZYLOPRIM TAKE 2 TABLETS BY MOUTH EVERY DAY   atorvastatin 40 MG tablet Commonly known as: LIPITOR TAKE 1 TABLET BY MOUTH EVERY DAY FOR CHOLESTEROL   colchicine 0.6 MG tablet TAKE 2 TABS BY MOUTH when  Gout flares up THEN 1 AN HOUR LATER, THEN 1 TWICE A DAY UNTIL COMPLETE RELIEF THEN 1 DAILY   famotidine 40 MG tablet Commonly known as: PEPCID Take 1 tablet (40 mg total) by mouth daily.   furosemide 40 MG tablet Commonly known as: LASIX Take 1 tablet (40 mg total) by mouth daily.   ONE-A-DAY MENS HEALTH FORMULA  PO Take 1 tablet by mouth daily.   pantoprazole 40 MG tablet Commonly known as: PROTONIX Take 1 tablet (40 mg total) by mouth daily.   sodium bicarbonate 650 MG tablet Take 650 mg by mouth 2 (two) times daily.   triamcinolone 0.1 % cream : eucerin Crea Apply 1 application topically 3 (three) times daily as needed.   valsartan 160 MG tablet Commonly known as: DIOVAN Take 1 tablet (160 mg total) by mouth daily.        Meds ordered this encounter  Medications   pantoprazole (PROTONIX) 40 MG tablet    Sig: Take 1 tablet (40 mg total) by mouth daily.    Dispense:  90 tablet    Refill:  3    Wife passed away Recently  Follow-up: Return in about 6 months (around 10/28/2022).  Claretta Fraise, M.D.

## 2022-04-29 ENCOUNTER — Other Ambulatory Visit: Payer: Self-pay | Admitting: *Deleted

## 2022-04-29 LAB — CBC WITH DIFFERENTIAL/PLATELET
Basophils Absolute: 0 10*3/uL (ref 0.0–0.2)
Basos: 1 %
EOS (ABSOLUTE): 0.2 10*3/uL (ref 0.0–0.4)
Eos: 2 %
Hematocrit: 33.2 % — ABNORMAL LOW (ref 37.5–51.0)
Hemoglobin: 12.1 g/dL — ABNORMAL LOW (ref 13.0–17.7)
Immature Grans (Abs): 0 10*3/uL (ref 0.0–0.1)
Immature Granulocytes: 0 %
Lymphocytes Absolute: 1.6 10*3/uL (ref 0.7–3.1)
Lymphs: 19 %
MCH: 37.1 pg — ABNORMAL HIGH (ref 26.6–33.0)
MCHC: 36.4 g/dL — ABNORMAL HIGH (ref 31.5–35.7)
MCV: 102 fL — ABNORMAL HIGH (ref 79–97)
Monocytes Absolute: 0.9 10*3/uL (ref 0.1–0.9)
Monocytes: 10 %
Neutrophils Absolute: 5.9 10*3/uL (ref 1.4–7.0)
Neutrophils: 68 %
Platelets: 204 10*3/uL (ref 150–450)
RBC: 3.26 x10E6/uL — ABNORMAL LOW (ref 4.14–5.80)
RDW: 12 % (ref 11.6–15.4)
WBC: 8.6 10*3/uL (ref 3.4–10.8)

## 2022-04-29 LAB — CMP14+EGFR
ALT: 27 IU/L (ref 0–44)
AST: 36 IU/L (ref 0–40)
Albumin/Globulin Ratio: 1.7 (ref 1.2–2.2)
Albumin: 4 g/dL (ref 3.8–4.8)
Alkaline Phosphatase: 139 IU/L — ABNORMAL HIGH (ref 44–121)
BUN/Creatinine Ratio: 14 (ref 10–24)
BUN: 31 mg/dL — ABNORMAL HIGH (ref 8–27)
Bilirubin Total: 0.3 mg/dL (ref 0.0–1.2)
CO2: 24 mmol/L (ref 20–29)
Calcium: 9.8 mg/dL (ref 8.6–10.2)
Chloride: 104 mmol/L (ref 96–106)
Creatinine, Ser: 2.27 mg/dL — ABNORMAL HIGH (ref 0.76–1.27)
Globulin, Total: 2.4 g/dL (ref 1.5–4.5)
Glucose: 125 mg/dL — ABNORMAL HIGH (ref 70–99)
Potassium: 4.4 mmol/L (ref 3.5–5.2)
Sodium: 143 mmol/L (ref 134–144)
Total Protein: 6.4 g/dL (ref 6.0–8.5)
eGFR: 29 mL/min/{1.73_m2} — ABNORMAL LOW (ref >59)

## 2022-04-29 LAB — LIPID PANEL
Chol/HDL Ratio: 2.8 ratio (ref 0.0–5.0)
Cholesterol, Total: 161 mg/dL (ref 100–199)
HDL: 58 mg/dL (ref >39)
LDL Chol Calc (NIH): 54 mg/dL (ref 0–99)
Triglycerides: 324 mg/dL — ABNORMAL HIGH (ref 0–149)
VLDL Cholesterol Cal: 49 mg/dL — ABNORMAL HIGH (ref 5–40)

## 2022-04-29 LAB — VITAMIN D 25 HYDROXY (VIT D DEFICIENCY, FRACTURES): Vit D, 25-Hydroxy: 24.2 ng/mL — ABNORMAL LOW (ref 30.0–100.0)

## 2022-04-29 MED ORDER — VITAMIN D (ERGOCALCIFEROL) 1.25 MG (50000 UNIT) PO CAPS: 50000.0000 [IU] | ORAL_CAPSULE | ORAL | 3 refills | Status: DC

## 2022-04-29 NOTE — Progress Notes (Signed)
Dear Michael Cervantes, Your Vitamin D is  low. You need a prescription strength supplement I will send that in for you. Nurse, if at all possible, could you send in a prescription for the patient for vitamin D 50,000 units, 1 p.o. weekly #13 with 3 refills? Many thanks, WS

## 2022-04-29 NOTE — Progress Notes (Signed)
Patient returning call. Please call back

## 2022-05-01 ENCOUNTER — Telehealth: Payer: Self-pay | Admitting: Family Medicine

## 2022-05-01 NOTE — Telephone Encounter (Signed)
  Prescription Request  05/01/2022  Is this a "Controlled Substance" medicine? NO  Have you seen your PCP in the last 2 weeks? NO  If YES, route message to pool  -  If NO, patient needs to be scheduled for appointment.  What is the name of the medication or equipment? Fluocinonide USP,0.05% - Told patient he would probably need appt because this is for dry scalp  Have you contacted your pharmacy to request a refill? yes   Which pharmacy would you like this sent to? [facility] [place]   Patient notified that their request is being sent to the clinical staff for review and that they should receive a response within 2 business days.

## 2022-05-02 NOTE — Telephone Encounter (Signed)
Request for RF on Fluocinonide USP,0.05%, not on current med list, last prescribed was 10/30/17. Last OV 04/28/22 Next OV 10/27/21 Please advise if pt NTBS

## 2022-05-05 ENCOUNTER — Other Ambulatory Visit: Payer: Self-pay | Admitting: Family Medicine

## 2022-05-05 DIAGNOSIS — L409 Psoriasis, unspecified: Secondary | ICD-10-CM

## 2022-05-05 MED ORDER — FLUOCINONIDE 0.05 % EX SOLN: 1.0000 | Freq: Two times a day (BID) | CUTANEOUS | 3 refills | Status: DC

## 2022-05-05 NOTE — Telephone Encounter (Signed)
Patient aware.

## 2022-05-05 NOTE — Telephone Encounter (Signed)
Scrip sent to [facility] [place].

## 2022-05-07 DIAGNOSIS — M9901 Segmental and somatic dysfunction of cervical region: Secondary | ICD-10-CM | POA: Diagnosis not present

## 2022-05-07 DIAGNOSIS — M9902 Segmental and somatic dysfunction of thoracic region: Secondary | ICD-10-CM | POA: Diagnosis not present

## 2022-05-07 DIAGNOSIS — S233XXA Sprain of ligaments of thoracic spine, initial encounter: Secondary | ICD-10-CM | POA: Diagnosis not present

## 2022-05-07 DIAGNOSIS — S338XXA Sprain of other parts of lumbar spine and pelvis, initial encounter: Secondary | ICD-10-CM | POA: Diagnosis not present

## 2022-05-07 DIAGNOSIS — M47816 Spondylosis without myelopathy or radiculopathy, lumbar region: Secondary | ICD-10-CM | POA: Diagnosis not present

## 2022-05-07 DIAGNOSIS — M9903 Segmental and somatic dysfunction of lumbar region: Secondary | ICD-10-CM | POA: Diagnosis not present

## 2022-05-07 DIAGNOSIS — M47812 Spondylosis without myelopathy or radiculopathy, cervical region: Secondary | ICD-10-CM | POA: Diagnosis not present

## 2022-05-13 ENCOUNTER — Telehealth: Payer: Self-pay | Admitting: *Deleted

## 2022-05-13 MED ORDER — VITAMIN D (ERGOCALCIFEROL) 1.25 MG (50000 UNIT) PO CAPS: 50000.0000 [IU] | ORAL_CAPSULE | ORAL | 3 refills | Status: DC

## 2022-05-13 NOTE — Telephone Encounter (Signed)
Pt called in today checking on his Vit D script Dr. Livia Snellen sent this in on 04/29/22 to [facility] Pt does not use this pharmacy Sent in to [facility]

## 2022-06-04 DIAGNOSIS — M47816 Spondylosis without myelopathy or radiculopathy, lumbar region: Secondary | ICD-10-CM | POA: Diagnosis not present

## 2022-06-04 DIAGNOSIS — S338XXA Sprain of other parts of lumbar spine and pelvis, initial encounter: Secondary | ICD-10-CM | POA: Diagnosis not present

## 2022-06-04 DIAGNOSIS — M9902 Segmental and somatic dysfunction of thoracic region: Secondary | ICD-10-CM | POA: Diagnosis not present

## 2022-06-04 DIAGNOSIS — M9903 Segmental and somatic dysfunction of lumbar region: Secondary | ICD-10-CM | POA: Diagnosis not present

## 2022-06-04 DIAGNOSIS — M47812 Spondylosis without myelopathy or radiculopathy, cervical region: Secondary | ICD-10-CM | POA: Diagnosis not present

## 2022-06-04 DIAGNOSIS — M9901 Segmental and somatic dysfunction of cervical region: Secondary | ICD-10-CM | POA: Diagnosis not present

## 2022-06-04 DIAGNOSIS — S233XXA Sprain of ligaments of thoracic spine, initial encounter: Secondary | ICD-10-CM | POA: Diagnosis not present

## 2022-06-09 ENCOUNTER — Other Ambulatory Visit: Payer: Self-pay | Admitting: Family Medicine

## 2022-06-09 DIAGNOSIS — M1A3790 Chronic gout due to renal impairment, unspecified ankle and foot, without tophus (tophi): Secondary | ICD-10-CM

## 2022-06-16 DIAGNOSIS — E785 Hyperlipidemia, unspecified: Secondary | ICD-10-CM | POA: Diagnosis not present

## 2022-06-16 DIAGNOSIS — N2581 Secondary hyperparathyroidism of renal origin: Secondary | ICD-10-CM | POA: Diagnosis not present

## 2022-06-16 DIAGNOSIS — Z8616 Personal history of COVID-19: Secondary | ICD-10-CM | POA: Diagnosis not present

## 2022-06-16 DIAGNOSIS — I129 Hypertensive chronic kidney disease with stage 1 through stage 4 chronic kidney disease, or unspecified chronic kidney disease: Secondary | ICD-10-CM | POA: Diagnosis not present

## 2022-06-16 DIAGNOSIS — N184 Chronic kidney disease, stage 4 (severe): Secondary | ICD-10-CM | POA: Diagnosis not present

## 2022-06-16 DIAGNOSIS — Z862 Personal history of diseases of the blood and blood-forming organs and certain disorders involving the immune mechanism: Secondary | ICD-10-CM | POA: Diagnosis not present

## 2022-06-16 DIAGNOSIS — D631 Anemia in chronic kidney disease: Secondary | ICD-10-CM | POA: Diagnosis not present

## 2022-06-16 DIAGNOSIS — E872 Acidosis, unspecified: Secondary | ICD-10-CM | POA: Diagnosis not present

## 2022-06-16 DIAGNOSIS — Z79899 Other long term (current) drug therapy: Secondary | ICD-10-CM | POA: Diagnosis not present

## 2022-07-02 DIAGNOSIS — M9901 Segmental and somatic dysfunction of cervical region: Secondary | ICD-10-CM | POA: Diagnosis not present

## 2022-07-02 DIAGNOSIS — M9902 Segmental and somatic dysfunction of thoracic region: Secondary | ICD-10-CM | POA: Diagnosis not present

## 2022-07-02 DIAGNOSIS — M47816 Spondylosis without myelopathy or radiculopathy, lumbar region: Secondary | ICD-10-CM | POA: Diagnosis not present

## 2022-07-02 DIAGNOSIS — S338XXA Sprain of other parts of lumbar spine and pelvis, initial encounter: Secondary | ICD-10-CM | POA: Diagnosis not present

## 2022-07-02 DIAGNOSIS — M47812 Spondylosis without myelopathy or radiculopathy, cervical region: Secondary | ICD-10-CM | POA: Diagnosis not present

## 2022-07-02 DIAGNOSIS — S233XXA Sprain of ligaments of thoracic spine, initial encounter: Secondary | ICD-10-CM | POA: Diagnosis not present

## 2022-07-02 DIAGNOSIS — M9903 Segmental and somatic dysfunction of lumbar region: Secondary | ICD-10-CM | POA: Diagnosis not present

## 2022-07-30 DIAGNOSIS — M9901 Segmental and somatic dysfunction of cervical region: Secondary | ICD-10-CM | POA: Diagnosis not present

## 2022-07-30 DIAGNOSIS — M9902 Segmental and somatic dysfunction of thoracic region: Secondary | ICD-10-CM | POA: Diagnosis not present

## 2022-07-30 DIAGNOSIS — S338XXA Sprain of other parts of lumbar spine and pelvis, initial encounter: Secondary | ICD-10-CM | POA: Diagnosis not present

## 2022-07-30 DIAGNOSIS — S134XXA Sprain of ligaments of cervical spine, initial encounter: Secondary | ICD-10-CM | POA: Diagnosis not present

## 2022-07-30 DIAGNOSIS — M9903 Segmental and somatic dysfunction of lumbar region: Secondary | ICD-10-CM | POA: Diagnosis not present

## 2022-07-30 DIAGNOSIS — S233XXA Sprain of ligaments of thoracic spine, initial encounter: Secondary | ICD-10-CM | POA: Diagnosis not present

## 2022-08-27 DIAGNOSIS — S134XXA Sprain of ligaments of cervical spine, initial encounter: Secondary | ICD-10-CM | POA: Diagnosis not present

## 2022-08-27 DIAGNOSIS — S233XXA Sprain of ligaments of thoracic spine, initial encounter: Secondary | ICD-10-CM | POA: Diagnosis not present

## 2022-08-27 DIAGNOSIS — M9902 Segmental and somatic dysfunction of thoracic region: Secondary | ICD-10-CM | POA: Diagnosis not present

## 2022-08-27 DIAGNOSIS — M9903 Segmental and somatic dysfunction of lumbar region: Secondary | ICD-10-CM | POA: Diagnosis not present

## 2022-08-27 DIAGNOSIS — M9901 Segmental and somatic dysfunction of cervical region: Secondary | ICD-10-CM | POA: Diagnosis not present

## 2022-08-27 DIAGNOSIS — S338XXA Sprain of other parts of lumbar spine and pelvis, initial encounter: Secondary | ICD-10-CM | POA: Diagnosis not present

## 2022-08-30 ENCOUNTER — Other Ambulatory Visit: Payer: Self-pay | Admitting: Family Medicine

## 2022-08-30 DIAGNOSIS — N184 Chronic kidney disease, stage 4 (severe): Secondary | ICD-10-CM

## 2022-09-24 DIAGNOSIS — S233XXA Sprain of ligaments of thoracic spine, initial encounter: Secondary | ICD-10-CM | POA: Diagnosis not present

## 2022-09-24 DIAGNOSIS — S134XXA Sprain of ligaments of cervical spine, initial encounter: Secondary | ICD-10-CM | POA: Diagnosis not present

## 2022-09-24 DIAGNOSIS — M9902 Segmental and somatic dysfunction of thoracic region: Secondary | ICD-10-CM | POA: Diagnosis not present

## 2022-09-24 DIAGNOSIS — S338XXA Sprain of other parts of lumbar spine and pelvis, initial encounter: Secondary | ICD-10-CM | POA: Diagnosis not present

## 2022-09-24 DIAGNOSIS — M9901 Segmental and somatic dysfunction of cervical region: Secondary | ICD-10-CM | POA: Diagnosis not present

## 2022-09-24 DIAGNOSIS — M9903 Segmental and somatic dysfunction of lumbar region: Secondary | ICD-10-CM | POA: Diagnosis not present

## 2022-10-22 DIAGNOSIS — M9902 Segmental and somatic dysfunction of thoracic region: Secondary | ICD-10-CM | POA: Diagnosis not present

## 2022-10-22 DIAGNOSIS — S338XXA Sprain of other parts of lumbar spine and pelvis, initial encounter: Secondary | ICD-10-CM | POA: Diagnosis not present

## 2022-10-22 DIAGNOSIS — M9903 Segmental and somatic dysfunction of lumbar region: Secondary | ICD-10-CM | POA: Diagnosis not present

## 2022-10-22 DIAGNOSIS — S134XXA Sprain of ligaments of cervical spine, initial encounter: Secondary | ICD-10-CM | POA: Diagnosis not present

## 2022-10-22 DIAGNOSIS — S233XXA Sprain of ligaments of thoracic spine, initial encounter: Secondary | ICD-10-CM | POA: Diagnosis not present

## 2022-10-22 DIAGNOSIS — M9901 Segmental and somatic dysfunction of cervical region: Secondary | ICD-10-CM | POA: Diagnosis not present

## 2022-10-24 ENCOUNTER — Other Ambulatory Visit: Payer: Self-pay | Admitting: Family Medicine

## 2022-10-24 DIAGNOSIS — I129 Hypertensive chronic kidney disease with stage 1 through stage 4 chronic kidney disease, or unspecified chronic kidney disease: Secondary | ICD-10-CM

## 2022-10-24 DIAGNOSIS — E782 Mixed hyperlipidemia: Secondary | ICD-10-CM

## 2022-10-28 ENCOUNTER — Ambulatory Visit: Payer: Medicare Other | Admitting: Family Medicine

## 2022-11-05 ENCOUNTER — Ambulatory Visit (INDEPENDENT_AMBULATORY_CARE_PROVIDER_SITE_OTHER): Payer: Medicare Other | Admitting: Family Medicine

## 2022-11-05 ENCOUNTER — Encounter: Payer: Self-pay | Admitting: Family Medicine

## 2022-11-05 VITALS — BP 113/66 | HR 118 | Temp 97.8°F | Ht 70.0 in | Wt 186.8 lb

## 2022-11-05 DIAGNOSIS — E782 Mixed hyperlipidemia: Secondary | ICD-10-CM

## 2022-11-05 DIAGNOSIS — M1A3790 Chronic gout due to renal impairment, unspecified ankle and foot, without tophus (tophi): Secondary | ICD-10-CM | POA: Diagnosis not present

## 2022-11-05 DIAGNOSIS — N184 Chronic kidney disease, stage 4 (severe): Secondary | ICD-10-CM

## 2022-11-05 DIAGNOSIS — Q613 Polycystic kidney, unspecified: Secondary | ICD-10-CM

## 2022-11-05 DIAGNOSIS — N183 Chronic kidney disease, stage 3 unspecified: Secondary | ICD-10-CM

## 2022-11-05 DIAGNOSIS — E559 Vitamin D deficiency, unspecified: Secondary | ICD-10-CM

## 2022-11-05 DIAGNOSIS — I129 Hypertensive chronic kidney disease with stage 1 through stage 4 chronic kidney disease, or unspecified chronic kidney disease: Secondary | ICD-10-CM

## 2022-11-05 MED ORDER — FUROSEMIDE 40 MG PO TABS: 40.0000 mg | ORAL_TABLET | Freq: Every day | ORAL | 2 refills | Status: DC

## 2022-11-05 MED ORDER — ATORVASTATIN CALCIUM 40 MG PO TABS: ORAL_TABLET | ORAL | 2 refills | Status: DC

## 2022-11-05 MED ORDER — ALLOPURINOL 100 MG PO TABS: 200.0000 mg | ORAL_TABLET | Freq: Every day | ORAL | 1 refills | Status: DC

## 2022-11-05 NOTE — Progress Notes (Addendum)
Subjective:  Patient ID: Michael Cervantes, male    DOB: 06/12/1946  Age: 77 y.o. MRN: [id number]  CC: Medical Management of Chronic Issues   HPI Michael Cervantes presents for  in for follow-up of elevated cholesterol. Doing well without complaints on current medication. Denies side effects of statin including myalgia and arthralgia and nausea. Currently no chest pain, shortness of breath or other cardiovascular related symptoms noted.   presents for  follow-up of hypertension. Patient has no history of headache chest pain or shortness of breath or recent cough. Patient also denies symptoms of TIA such as focal numbness or weakness. Patient denies side effects from medication. States taking it regularly.  Followedby nephrology for PCK. They have requested I include his PTH & Phos.  With his blood work here. No gout attack for 6 years. Still taking allopuurianol 200 mg daily      11/05/2022    1:22 PM 04/28/2022    3:22 PM 12/18/2021    9:10 AM  Depression screen PHQ 2/9  Decreased Interest 0 0 1  Down, Depressed, Hopeless 0 0 1  PHQ - 2 Score 0 0 2  Altered sleeping   0  Tired, decreased energy   1  Change in appetite   2  Feeling bad or failure about yourself    0  Trouble concentrating   1  Moving slowly or fidgety/restless   0  Suicidal thoughts   0  PHQ-9 Score   6  Difficult doing work/chores   Somewhat difficult    History Michael Cervantes has a past medical history of Anemia, Arthritis, Bulging lumbar disc, Cellulitis (08/2014), Gout, Hyperlipidemia, OA (osteoarthritis) of foot, Polycystic kidney disease, and Sciatica (2016).   He has a past surgical history that includes Cyst excision and Tonsillectomy.   His family history includes Alzheimer's disease in his mother; Arthritis in his sister; Cancer in his father; Dementia in his mother.He reports that he quit smoking about 33 years ago. His smoking use included cigarettes. He has a 35.00 pack-year smoking history. His smokeless tobacco  use includes snuff. He reports current alcohol use of about 2.0 standard drinks of alcohol per week. He reports that he does not use drugs.    ROS Review of Systems  Constitutional:  Negative for fever.  Respiratory:  Negative for shortness of breath.   Cardiovascular:  Negative for chest pain.  Musculoskeletal:  Negative for arthralgias.  Skin:  Negative for rash.    Objective:  BP 113/66   Pulse (!) 118   Temp 97.8 F (36.6 C)   Ht 5\' 10"  (1.778 m)   Wt 186 lb 12.8 oz (84.7 kg)   SpO2 97%   BMI 26.80 kg/m   BP Readings from Last 3 Encounters:  11/05/22 113/66  04/29/22 117/77  10/23/21 123/73    Wt Readings from Last 3 Encounters:  11/05/22 186 lb 12.8 oz (84.7 kg)  04/29/22 181 lb 9.6 oz (82.4 kg)  12/18/21 184 lb (83.5 kg)     Physical Exam Vitals reviewed.  Constitutional:      Appearance: He is well-developed.  HENT:     Head: Normocephalic and atraumatic.     Right Ear: External ear normal.     Left Ear: External ear normal.     Mouth/Throat:     Pharynx: No oropharyngeal exudate or posterior oropharyngeal erythema.  Eyes:     Pupils: Pupils are equal, round, and reactive to light.  Cardiovascular:     Rate  and Rhythm: Normal rate and regular rhythm.     Heart sounds: No murmur heard. Pulmonary:     Effort: No respiratory distress.     Breath sounds: Normal breath sounds.  Musculoskeletal:     Cervical back: Normal range of motion and neck supple.  Neurological:     Mental Status: He is alert and oriented to person, place, and time.       Assessment & Plan:   Michael Cervantes was seen today for medical management of chronic issues.  Diagnoses and all orders for this visit:  Mixed hyperlipidemia -     Lipid panel -     atorvastatin (LIPITOR) 40 MG tablet; TAKE 1 TABLET BY MOUTH EVERY DAY FOR CHOLESTEROL  Benign hypertension with chronic kidney disease, stage IV -     CBC with Differential/Platelet -     CMP14+EGFR  Vitamin D deficiency -      VITAMIN D 25 Hydroxy (Vit-D Deficiency, Fractures)  Chronic gout due to renal impairment involving foot without tophus, unspecified laterality -     allopurinol (ZYLOPRIM) 100 MG tablet; Take 2 tablets (200 mg total) by mouth daily. -     Intact PTH (Includes Calcium) -     Phosphorus -     Uric acid  Chronic renal insufficiency, stage 4 (severe) -     furosemide (LASIX) 40 MG tablet; Take 1 tablet (40 mg total) by mouth daily.  Polycystic kidney disease -     Intact PTH (Includes Calcium) -     Phosphorus -     Uric acid       I have changed Michael Cervantes's allopurinol and furosemide. I am also having him maintain his Multiple Vitamins-Minerals (ONE-A-DAY MENS HEALTH FORMULA PO), colchicine, triamcinolone 0.1 % cream : eucerin, famotidine, sodium bicarbonate, pantoprazole, fluocinonide, Vitamin D (Ergocalciferol), valsartan, and atorvastatin.  Allergies as of 11/05/2022       Reactions   Cefaclor Rash        Medication List        Accurate as of November 05, 2022 11:59 PM. If you have any questions, ask your nurse or doctor.          allopurinol 100 MG tablet Commonly known as: ZYLOPRIM Take 2 tablets (200 mg total) by mouth daily.   atorvastatin 40 MG tablet Commonly known as: LIPITOR TAKE 1 TABLET BY MOUTH EVERY DAY FOR CHOLESTEROL   colchicine 0.6 MG tablet TAKE 2 TABS BY MOUTH when  Gout flares up THEN 1 AN HOUR LATER, THEN 1 TWICE A DAY UNTIL COMPLETE RELIEF THEN 1 DAILY   famotidine 40 MG tablet Commonly known as: PEPCID Take 1 tablet (40 mg total) by mouth daily.   fluocinonide 0.05 % external solution Commonly known as: LIDEX Apply 1 Application topically 2 (two) times daily.   furosemide 40 MG tablet Commonly known as: LASIX Take 1 tablet (40 mg total) by mouth daily.   ONE-A-DAY MENS HEALTH FORMULA PO Take 1 tablet by mouth daily.   pantoprazole 40 MG tablet Commonly known as: PROTONIX Take 1 tablet (40 mg total) by mouth daily.   sodium  bicarbonate 650 MG tablet Take 650 mg by mouth 2 (two) times daily.   triamcinolone 0.1 % cream : eucerin Crea Apply 1 application topically 3 (three) times daily as needed.   valsartan 160 MG tablet Commonly known as: DIOVAN TAKE 1 TABLET BY MOUTH EVERY DAY   Vitamin D (Ergocalciferol) 1.25 MG (50000 UNIT) Caps capsule Commonly known  as: DRISDOL Take 1 capsule (50,000 Units total) by mouth every 7 (seven) days.         Follow-up: Return in about 6 months (around 05/07/2023).  Mechele ClaudeWarren Mabry Santarelli, M.D.

## 2022-11-06 LAB — INTACT PTH (INCLUDES CALCIUM)

## 2022-11-06 LAB — URIC ACID: Uric Acid: 4.4 mg/dL (ref 3.8–8.4)

## 2022-11-06 LAB — PHOSPHORUS: Phosphorus: 2.4 mg/dL — ABNORMAL LOW (ref 2.8–4.1)

## 2022-11-07 LAB — CBC WITH DIFFERENTIAL/PLATELET
Basophils Absolute: 0 10*3/uL (ref 0.0–0.2)
Basos: 1 %
EOS (ABSOLUTE): 0.1 10*3/uL (ref 0.0–0.4)
Eos: 1 %
Hematocrit: 37.2 % — ABNORMAL LOW (ref 37.5–51.0)
Hemoglobin: 13.1 g/dL (ref 13.0–17.7)
Immature Grans (Abs): 0 10*3/uL (ref 0.0–0.1)
Immature Granulocytes: 0 %
Lymphocytes Absolute: 1.3 10*3/uL (ref 0.7–3.1)
Lymphs: 15 %
MCH: 36 pg — ABNORMAL HIGH (ref 26.6–33.0)
MCHC: 35.2 g/dL (ref 31.5–35.7)
MCV: 102 fL — ABNORMAL HIGH (ref 79–97)
Monocytes Absolute: 0.9 10*3/uL (ref 0.1–0.9)
Monocytes: 11 %
Neutrophils Absolute: 6.3 10*3/uL (ref 1.4–7.0)
Neutrophils: 72 %
Platelets: 202 10*3/uL (ref 150–450)
RBC: 3.64 x10E6/uL — ABNORMAL LOW (ref 4.14–5.80)
RDW: 12 % (ref 11.6–15.4)
WBC: 8.6 10*3/uL (ref 3.4–10.8)

## 2022-11-07 LAB — LIPID PANEL
Chol/HDL Ratio: 2.8 ratio (ref 0.0–5.0)
Cholesterol, Total: 170 mg/dL (ref 100–199)
HDL: 60 mg/dL (ref >39)
LDL Chol Calc (NIH): 55 mg/dL (ref 0–99)
Triglycerides: 366 mg/dL — ABNORMAL HIGH (ref 0–149)
VLDL Cholesterol Cal: 55 mg/dL — ABNORMAL HIGH (ref 5–40)

## 2022-11-07 LAB — CMP14+EGFR
ALT: 14 IU/L (ref 0–44)
AST: 26 IU/L (ref 0–40)
Albumin/Globulin Ratio: 1.7 (ref 1.2–2.2)
Albumin: 4.2 g/dL (ref 3.8–4.8)
Alkaline Phosphatase: 122 IU/L — ABNORMAL HIGH (ref 44–121)
BUN/Creatinine Ratio: 13 (ref 10–24)
BUN: 33 mg/dL — ABNORMAL HIGH (ref 8–27)
Bilirubin Total: 1 mg/dL (ref 0.0–1.2)
CO2: 23 mmol/L (ref 20–29)
Calcium: 9.6 mg/dL (ref 8.6–10.2)
Chloride: 101 mmol/L (ref 96–106)
Creatinine, Ser: 2.59 mg/dL — ABNORMAL HIGH (ref 0.76–1.27)
Globulin, Total: 2.5 g/dL (ref 1.5–4.5)
Glucose: 131 mg/dL — ABNORMAL HIGH (ref 70–99)
Potassium: 3.9 mmol/L (ref 3.5–5.2)
Sodium: 143 mmol/L (ref 134–144)
Total Protein: 6.7 g/dL (ref 6.0–8.5)
eGFR: 25 mL/min/{1.73_m2} — ABNORMAL LOW (ref >59)

## 2022-11-07 LAB — VITAMIN D 25 HYDROXY (VIT D DEFICIENCY, FRACTURES): Vit D, 25-Hydroxy: 56.6 ng/mL (ref 30.0–100.0)

## 2022-11-07 LAB — INTACT PTH (INCLUDES CALCIUM): Calcium, Serum: 9.9 mg/dL

## 2022-11-10 NOTE — Progress Notes (Signed)
Patient returning call.

## 2022-11-19 DIAGNOSIS — S134XXA Sprain of ligaments of cervical spine, initial encounter: Secondary | ICD-10-CM | POA: Diagnosis not present

## 2022-11-19 DIAGNOSIS — M9902 Segmental and somatic dysfunction of thoracic region: Secondary | ICD-10-CM | POA: Diagnosis not present

## 2022-11-19 DIAGNOSIS — S233XXA Sprain of ligaments of thoracic spine, initial encounter: Secondary | ICD-10-CM | POA: Diagnosis not present

## 2022-11-19 DIAGNOSIS — M9901 Segmental and somatic dysfunction of cervical region: Secondary | ICD-10-CM | POA: Diagnosis not present

## 2022-11-19 DIAGNOSIS — S338XXA Sprain of other parts of lumbar spine and pelvis, initial encounter: Secondary | ICD-10-CM | POA: Diagnosis not present

## 2022-11-19 DIAGNOSIS — M9903 Segmental and somatic dysfunction of lumbar region: Secondary | ICD-10-CM | POA: Diagnosis not present

## 2022-12-15 DIAGNOSIS — E785 Hyperlipidemia, unspecified: Secondary | ICD-10-CM | POA: Diagnosis not present

## 2022-12-15 DIAGNOSIS — N184 Chronic kidney disease, stage 4 (severe): Secondary | ICD-10-CM | POA: Diagnosis not present

## 2022-12-15 DIAGNOSIS — D631 Anemia in chronic kidney disease: Secondary | ICD-10-CM | POA: Diagnosis not present

## 2022-12-15 DIAGNOSIS — R809 Proteinuria, unspecified: Secondary | ICD-10-CM | POA: Diagnosis not present

## 2022-12-15 DIAGNOSIS — I129 Hypertensive chronic kidney disease with stage 1 through stage 4 chronic kidney disease, or unspecified chronic kidney disease: Secondary | ICD-10-CM | POA: Diagnosis not present

## 2022-12-15 DIAGNOSIS — N2581 Secondary hyperparathyroidism of renal origin: Secondary | ICD-10-CM | POA: Diagnosis not present

## 2022-12-19 ENCOUNTER — Other Ambulatory Visit: Payer: Medicare Other

## 2022-12-19 DIAGNOSIS — N184 Chronic kidney disease, stage 4 (severe): Secondary | ICD-10-CM | POA: Diagnosis not present

## 2022-12-19 DIAGNOSIS — R809 Proteinuria, unspecified: Secondary | ICD-10-CM | POA: Diagnosis not present

## 2022-12-23 ENCOUNTER — Ambulatory Visit (INDEPENDENT_AMBULATORY_CARE_PROVIDER_SITE_OTHER): Payer: Medicare Other

## 2022-12-23 VITALS — Ht 71.0 in | Wt 185.0 lb

## 2022-12-23 DIAGNOSIS — Z0000 Encounter for general adult medical examination without abnormal findings: Secondary | ICD-10-CM | POA: Diagnosis not present

## 2022-12-23 NOTE — Patient Instructions (Signed)
Michael Cervantes , Thank you for taking time to come for your Medicare Wellness Visit. I appreciate your ongoing commitment to your health goals. Please review the following plan we discussed and let me know if I can assist you in the future.   These are the goals we discussed:  Goals      DIET - EAT MORE FRUITS AND VEGETABLES     Exercise 3x per week (30 min per time)     12/18/2021 - patient hopes to maintain his independence, stay active and healthy         This is a list of the screening recommended for you and due dates:  Health Maintenance  Topic Date Due   COVID-19 Vaccine (4 - 2023-24 season) 03/28/2022   Flu Shot  02/26/2023   Medicare Annual Wellness Visit  12/23/2023   DTaP/Tdap/Td vaccine (3 - Td or Tdap) 10/24/2031   Pneumonia Vaccine  Completed   Hepatitis C Screening  Completed   Zoster (Shingles) Vaccine  Completed   HPV Vaccine  Aged Out   Colon Cancer Screening  Discontinued    Advanced directives: Advance directive discussed with you today. I have provided a copy for you to complete at home and have notarized. Once this is complete please bring a copy in to our office so we can scan it into your chart.   Conditions/risks identified: Aim for 30 minutes of exercise or brisk walking, 6-8 glasses of water, and 5 servings of fruits and vegetables each day.   Next appointment: Follow up in one year for your annual wellness visit.   Preventive Care 38 Years and Older, Male  Preventive care refers to lifestyle choices and visits with your health care provider that can promote health and wellness. What does preventive care include? A yearly physical exam. This is also called an annual well check. Dental exams once or twice a year. Routine eye exams. Ask your health care provider how often you should have your eyes checked. Personal lifestyle choices, including: Daily care of your teeth and gums. Regular physical activity. Eating a healthy diet. Avoiding tobacco and drug  use. Limiting alcohol use. Practicing safe sex. Taking low doses of aspirin every day. Taking vitamin and mineral supplements as recommended by your health care provider. What happens during an annual well check? The services and screenings done by your health care provider during your annual well check will depend on your age, overall health, lifestyle risk factors, and family history of disease. Counseling  Your health care provider may ask you questions about your: Alcohol use. Tobacco use. Drug use. Emotional well-being. Home and relationship well-being. Sexual activity. Eating habits. History of falls. Memory and ability to understand (cognition). Work and work Astronomer. Screening  You may have the following tests or measurements: Height, weight, and BMI. Blood pressure. Lipid and cholesterol levels. These may be checked every 5 years, or more frequently if you are over 38 years old. Skin check. Lung cancer screening. You may have this screening every year starting at age 72 if you have a 30-pack-year history of smoking and currently smoke or have quit within the past 15 years. Fecal occult blood test (FOBT) of the stool. You may have this test every year starting at age 41. Flexible sigmoidoscopy or colonoscopy. You may have a sigmoidoscopy every 5 years or a colonoscopy every 10 years starting at age 53. Prostate cancer screening. Recommendations will vary depending on your family history and other risks. Hepatitis C blood test.  Hepatitis B blood test. Sexually transmitted disease (STD) testing. Diabetes screening. This is done by checking your blood sugar (glucose) after you have not eaten for a while (fasting). You may have this done every 1-3 years. Abdominal aortic aneurysm (AAA) screening. You may need this if you are a current or former smoker. Osteoporosis. You may be screened starting at age 80 if you are at high risk. Talk with your health care provider about  your test results, treatment options, and if necessary, the need for more tests. Vaccines  Your health care provider may recommend certain vaccines, such as: Influenza vaccine. This is recommended every year. Tetanus, diphtheria, and acellular pertussis (Tdap, Td) vaccine. You may need a Td booster every 10 years. Zoster vaccine. You may need this after age 60. Pneumococcal 13-valent conjugate (PCV13) vaccine. One dose is recommended after age 81. Pneumococcal polysaccharide (PPSV23) vaccine. One dose is recommended after age 15. Talk to your health care provider about which screenings and vaccines you need and how often you need them. This information is not intended to replace advice given to you by your health care provider. Make sure you discuss any questions you have with your health care provider. Document Released: 08/10/2015 Document Revised: 04/02/2016 Document Reviewed: 05/15/2015 Elsevier Interactive Patient Education  2017 [employer].  Fall Prevention in the Home Falls can cause injuries. They can happen to people of all ages. There are many things you can do to make your home safe and to help prevent falls. What can I do on the outside of my home? Regularly fix the edges of walkways and driveways and fix any cracks. Remove anything that might make you trip as you walk through a door, such as a raised step or threshold. Trim any bushes or trees on the path to your home. Use bright outdoor lighting. Clear any walking paths of anything that might make someone trip, such as rocks or tools. Regularly check to see if handrails are loose or broken. Make sure that both sides of any steps have handrails. Any raised decks and porches should have guardrails on the edges. Have any leaves, snow, or ice cleared regularly. Use sand or salt on walking paths during winter. Clean up any spills in your garage right away. This includes oil or grease spills. What can I do in the bathroom? Use  night lights. Install grab bars by the toilet and in the tub and shower. Do not use towel bars as grab bars. Use non-skid mats or decals in the tub or shower. If you need to sit down in the shower, use a plastic, non-slip stool. Keep the floor dry. Clean up any water that spills on the floor as soon as it happens. Remove soap buildup in the tub or shower regularly. Attach bath mats securely with double-sided non-slip rug tape. Do not have throw rugs and other things on the floor that can make you trip. What can I do in the bedroom? Use night lights. Make sure that you have a light by your bed that is easy to reach. Do not use any sheets or blankets that are too big for your bed. They should not hang down onto the floor. Have a firm chair that has side arms. You can use this for support while you get dressed. Do not have throw rugs and other things on the floor that can make you trip. What can I do in the kitchen? Clean up any spills right away. Avoid walking on wet floors.  Keep items that you use a lot in easy-to-reach places. If you need to reach something above you, use a strong step stool that has a grab bar. Keep electrical cords out of the way. Do not use floor polish or wax that makes floors slippery. If you must use wax, use non-skid floor wax. Do not have throw rugs and other things on the floor that can make you trip. What can I do with my stairs? Do not leave any items on the stairs. Make sure that there are handrails on both sides of the stairs and use them. Fix handrails that are broken or loose. Make sure that handrails are as long as the stairways. Check any carpeting to make sure that it is firmly attached to the stairs. Fix any carpet that is loose or worn. Avoid having throw rugs at the top or bottom of the stairs. If you do have throw rugs, attach them to the floor with carpet tape. Make sure that you have a light switch at the top of the stairs and the bottom of the  stairs. If you do not have them, ask someone to add them for you. What else can I do to help prevent falls? Wear shoes that: Do not have high heels. Have rubber bottoms. Are comfortable and fit you well. Are closed at the toe. Do not wear sandals. If you use a stepladder: Make sure that it is fully opened. Do not climb a closed stepladder. Make sure that both sides of the stepladder are locked into place. Ask someone to hold it for you, if possible. Clearly mark and make sure that you can see: Any grab bars or handrails. First and last steps. Where the edge of each step is. Use tools that help you move around (mobility aids) if they are needed. These include: Canes. Walkers. Scooters. Crutches. Turn on the lights when you go into a dark area. Replace any light bulbs as soon as they burn out. Set up your furniture so you have a clear path. Avoid moving your furniture around. If any of your floors are uneven, fix them. If there are any pets around you, be aware of where they are. Review your medicines with your doctor. Some medicines can make you feel dizzy. This can increase your chance of falling. Ask your doctor what other things that you can do to help prevent falls. This information is not intended to replace advice given to you by your health care provider. Make sure you discuss any questions you have with your health care provider. Document Released: 05/10/2009 Document Revised: 12/20/2015 Document Reviewed: 08/18/2014 Elsevier Interactive Patient Education  2017 [employer].

## 2022-12-23 NOTE — Progress Notes (Signed)
Subjective:   Michael Cervantes is a 77 y.o. male who presents for Medicare Annual/Subsequent preventive examination. I connected with  Michael Cervantes on 12/23/22 by a audio enabled telemedicine application and verified that I am speaking with the correct person using two identifiers.  Patient Location: Home  Provider Location: Home Office  I discussed the limitations of evaluation and management by telemedicine. The patient expressed understanding and agreed to proceed.  Review of Systems     Cardiac Risk Factors include: advanced age (>25men, >33 women);male gender;dyslipidemia;hypertension     Objective:    Today's Vitals   12/23/22 0853  Weight: 185 lb (83.9 kg)  Height: 5\' 11"  (1.803 m)   Body mass index is 25.8 kg/m.     12/23/2022    8:56 AM 12/18/2021    9:14 AM 12/17/2020    9:30 AM 11/02/2019   10:45 AM 03/18/2016   10:36 AM 11/22/2014   10:29 AM 09/12/2014   12:34 PM  Advanced Directives  Does Patient Have a Medical Advance Directive? No No No No Yes No No  Type of Agricultural consultant;Living will    Does patient want to make changes to medical advance directive?     No - Patient declined    Copy of Healthcare Power of Attorney in Chart?     No - copy requested    Would patient like information on creating a medical advance directive? No - Patient declined No - Patient declined Yes (MAU/Ambulatory/Procedural Areas - Information given) No - Patient declined  Yes - Educational materials given No - patient declined information    Current Medications (verified) Outpatient Encounter Medications as of 12/23/2022  Medication Sig   atorvastatin (LIPITOR) 40 MG tablet TAKE 1 TABLET BY MOUTH EVERY DAY FOR CHOLESTEROL   colchicine 0.6 MG tablet TAKE 2 TABS BY MOUTH when  Gout flares up THEN 1 AN HOUR LATER, THEN 1 TWICE A DAY UNTIL COMPLETE RELIEF THEN 1 DAILY   famotidine (PEPCID) 40 MG tablet Take 1 tablet (40 mg total) by mouth daily.    fluocinonide (LIDEX) 0.05 % external solution Apply 1 Application topically 2 (two) times daily.   furosemide (LASIX) 40 MG tablet Take 1 tablet (40 mg total) by mouth daily.   Multiple Vitamins-Minerals (ONE-A-DAY MENS HEALTH FORMULA PO) Take 1 tablet by mouth daily.   pantoprazole (PROTONIX) 40 MG tablet Take 1 tablet (40 mg total) by mouth daily.   sodium bicarbonate 650 MG tablet Take 650 mg by mouth 2 (two) times daily.   Triamcinolone Acetonide (TRIAMCINOLONE 0.1 % CREAM : EUCERIN) CREA Apply 1 application topically 3 (three) times daily as needed.   valsartan (DIOVAN) 160 MG tablet TAKE 1 TABLET BY MOUTH EVERY DAY   Vitamin D, Ergocalciferol, (DRISDOL) 1.25 MG (50000 UNIT) CAPS capsule Take 1 capsule (50,000 Units total) by mouth every 7 (seven) days.   allopurinol (ZYLOPRIM) 100 MG tablet Take 2 tablets (200 mg total) by mouth daily. (Patient not taking: Reported on 12/23/2022)   No facility-administered encounter medications on file as of 12/23/2022.    Allergies (verified) Cefaclor   History: Past Medical History:  Diagnosis Date   Anemia    Arthritis    Bulging lumbar disc    Cellulitis 08/2014   rt foot   Gout    Hyperlipidemia    OA (osteoarthritis) of foot    rt foot   Polycystic kidney disease    Sciatica 2016  Past Surgical History:  Procedure Laterality Date   CYST EXCISION     tail bone   TONSILLECTOMY     Family History  Problem Relation Age of Onset   Dementia Mother    Alzheimer's disease Mother    Cancer Father        lymphoma met to brain   Arthritis Sister    Social History   Socioeconomic History   Marital status: Married    Spouse name: Michael Cervantes   Number of children: 2   Years of education: Not on file   Highest education level: Some college, no degree  Occupational History   Occupation: Retired  Tobacco Use   Smoking status: Former    Packs/day: 1.00    Years: 35.00    Additional pack years: 0.00    Total pack years: 35.00    Types:  Cigarettes    Quit date: 07/28/1989    Years since quitting: 33.4   Smokeless tobacco: Current    Types: Snuff  Vaping Use   Vaping Use: Never used  Substance and Sexual Activity   Alcohol use: Yes    Alcohol/week: 2.0 standard drinks of alcohol    Types: 2 Cans of beer per week    Comment: socially   Drug use: No   Sexual activity: Yes  Other Topics Concern   Not on file  Social History Narrative   Lives with wife. Wife's daughter lives in [place].   Social Determinants of Health   Financial Resource Strain: Low Risk  (12/23/2022)   Overall Financial Resource Strain (CARDIA)    Difficulty of Paying Living Expenses: Not hard at all  Food Insecurity: No Food Insecurity (12/23/2022)   Hunger Vital Sign    Worried About Running Out of Food in the Last Year: Never true    Ran Out of Food in the Last Year: Never true  Transportation Needs: No Transportation Needs (12/23/2022)   PRAPARE - Administrator, Civil Service (Medical): No    Lack of Transportation (Non-Medical): No  Physical Activity: Sufficiently Active (12/23/2022)   Exercise Vital Sign    Days of Exercise per Week: 7 days    Minutes of Exercise per Session: 30 min  Stress: No Stress Concern Present (12/23/2022)   [employer] of Occupational Health - Occupational Stress Questionnaire    Feeling of Stress : Not at all  Social Connections: Moderately Isolated (12/23/2022)   Social Connection and Isolation Panel [NHANES]    Frequency of Communication with Friends and Family: More than three times a week    Frequency of Social Gatherings with Friends and Family: More than three times a week    Attends Religious Services: Never    Database administrator or Organizations: Yes    Attends Banker Meetings: 1 to 4 times per year    Marital Status: Widowed    Tobacco Counseling Ready to quit: Not Answered Counseling given: Not Answered   Clinical Intake:  Pre-visit preparation completed:  Yes  Pain : No/denies pain     Nutritional Risks: None Diabetes: No  How often do you need to have someone help you when you read instructions, pamphlets, or other written materials from your doctor or pharmacy?: 1 - Never  Diabetic?no   Interpreter Needed?: No  Information entered by :: Renie Ora, LPN   Activities of Daily Living    12/23/2022    8:56 AM  In your present state of health, do you have  any difficulty performing the following activities:  Hearing? 0  Vision? 0  Difficulty concentrating or making decisions? 0  Walking or climbing stairs? 0  Dressing or bathing? 0  Doing errands, shopping? 0  Preparing Food and eating ? N  Using the Toilet? N  In the past six months, have you accidently leaked urine? N  Do you have problems with loss of bowel control? N  Managing your Medications? N  Managing your Finances? N  Housekeeping or managing your Housekeeping? N    Patient Care Team: Mechele Claude, MD as PCP - General (Family Medicine) Rocco, Mora Appl, MD as Referring Physician (Nephrology) Michaelle Copas, MD as Referring Physician (Optometry)  Indicate any recent Medical Services you may have received from other than [facility] providers in the past year (date may be approximate).     Assessment:   This is a routine wellness examination for Ryyan.  Hearing/Vision screen Vision Screening - Comments:: Wears rx glasses - up to date with routine eye exams with  Dr.Lee  Dietary issues and exercise activities discussed: Current Exercise Habits: Home exercise routine, Type of exercise: walking, Time (Minutes): 30, Frequency (Times/Week): 7, Weekly Exercise (Minutes/Week): 210, Intensity: Mild, Exercise limited by: None identified   Goals Addressed             This Visit's Progress    DIET - EAT MORE FRUITS AND VEGETABLES   On track    Exercise 3x per week (30 min per time)   On track    12/18/2021 - patient hopes to maintain his independence, stay  active and healthy        Depression Screen    12/23/2022    8:55 AM 11/05/2022    1:22 PM 04/28/2022    3:22 PM 12/18/2021    9:10 AM 10/23/2021    9:45 AM 04/25/2021   10:57 AM 01/18/2021   11:09 AM  PHQ 2/9 Scores  PHQ - 2 Score 0 0 0 2 0 0 0  PHQ- 9 Score    6       Fall Risk    12/23/2022    8:54 AM 11/05/2022    1:22 PM 04/28/2022    3:21 PM 12/18/2021    9:06 AM 10/23/2021    9:48 AM  Fall Risk   Falls in the past year? 0 0 1 1 1   Number falls in past yr: 0  1 1 0  Injury with Fall? 0  0 1 0  Risk for fall due to : No Fall Risks  History of fall(s) History of fall(s);Impaired balance/gait;Orthopedic patient History of fall(s);Impaired balance/gait  Follow up Falls prevention discussed  Falls evaluation completed Education provided;Falls prevention discussed Falls evaluation completed    FALL RISK PREVENTION PERTAINING TO THE HOME:  Any stairs in or around the home? No  If so, are there any without handrails? No  Home free of loose throw rugs in walkways, pet beds, electrical cords, etc? Yes  Adequate lighting in your home to reduce risk of falls? Yes   ASSISTIVE DEVICES UTILIZED TO PREVENT FALLS:  Life alert? No  Use of a cane, walker or w/c? No  Grab bars in the bathroom? Yes  Shower chair or bench in shower? Yes  Elevated toilet seat or a handicapped toilet? Yes       03/25/2017   10:52 AM 03/18/2016   10:42 AM 11/22/2014   10:34 AM  MMSE - Mini Mental State Exam  Orientation to time  5 5 5   Orientation to Place 5 5 5   Registration 3 3 3   Attention/ Calculation 3 5 5   Recall 2 3 3   Language- name 2 objects 2 2 2   Language- repeat 1 1 1   Language- follow 3 step command 3 3 3   Language- read & follow direction 1 1 1   Write a sentence 1 1 1   Copy design 1 1 1   Total score 27 30 30         12/23/2022    8:57 AM 12/18/2021    9:12 AM 11/02/2019   10:47 AM  6CIT Screen  What Year? 0 points 0 points 0 points  What month? 0 points 0 points 0 points  What  time? 0 points 0 points 0 points  Count back from 20 0 points 0 points 0 points  Months in reverse 0 points 0 points 0 points  Repeat phrase 0 points 0 points 0 points  Total Score 0 points 0 points 0 points    Immunizations Immunization History  Administered Date(s) Administered   Fluad Quad(high Dose 65+) 04/28/2019, 04/25/2021, 04/28/2022   Influenza, High Dose Seasonal PF 04/23/2016, 04/23/2016, 05/03/2018, 04/28/2019, 04/12/2020   Influenza,inj,Quad PF,6+ Mos 06/05/2014, 06/18/2015, 04/24/2017   Influenza-Unspecified 06/02/2013, 06/05/2014, 06/18/2015, 05/26/2016, 04/24/2017, 05/07/2017   Moderna Sars-Covid-2 Vaccination 09/21/2019, 10/19/2019, 06/20/2020   Pneumococcal Conjugate-13 10/19/2014, 10/19/2014, 06/12/2015   Pneumococcal Polysaccharide-23 07/28/2005, 05/03/2018   Pneumococcal-Unspecified 07/28/2005, 05/03/2018   Tdap 12/13/2004, 10/23/2021   Zoster Recombinat (Shingrix) 04/25/2021, 10/23/2021   Zoster, Live 06/18/2015, 06/18/2015    TDAP status: Up to date  Flu Vaccine status: Up to date  Pneumococcal vaccine status: Up to date  Covid-19 vaccine status: Completed vaccines  Qualifies for Shingles Vaccine? Yes   Zostavax completed Yes   Shingrix Completed?: Yes  Screening Tests Health Maintenance  Topic Date Due   COVID-19 Vaccine (4 - 2023-24 season) 03/28/2022   INFLUENZA VACCINE  02/26/2023   Medicare Annual Wellness (AWV)  12/23/2023   DTaP/Tdap/Td (3 - Td or Tdap) 10/24/2031   Pneumonia Vaccine 1+ Years old  Completed   Hepatitis C Screening  Completed   Zoster Vaccines- Shingrix  Completed   HPV VACCINES  Aged Out   Colonoscopy  Discontinued    Health Maintenance  Health Maintenance Due  Topic Date Due   COVID-19 Vaccine (4 - 2023-24 season) 03/28/2022    Colorectal cancer screening: No longer required.   Lung Cancer Screening: (Low Dose CT Chest recommended if Age 24-80 years, 30 pack-year currently smoking OR have quit w/in 15years.)  does not qualify.   Lung Cancer Screening Referral: n/a  Additional Screening:  Hepatitis C Screening: does not qualify; Completed 04/23/2016  Vision Screening: Recommended annual ophthalmology exams for early detection of glaucoma and other disorders of the eye. Is the patient up to date with their annual eye exam?  Yes  Who is the provider or what is the name of the office in which the patient attends annual eye exams? Dr.Lee  If pt is not established with a provider, would they like to be referred to a provider to establish care? No .   Dental Screening: Recommended annual dental exams for proper oral hygiene  Community Resource Referral / Chronic Care Management: CRR required this visit?  No   CCM required this visit?  No      Plan:     I have personally reviewed and noted the following in the patient's chart:   Medical and social history Use of  alcohol, tobacco or illicit drugs  Current medications and supplements including opioid prescriptions. Patient is not currently taking opioid prescriptions. Functional ability and status Nutritional status Physical activity Advanced directives List of other physicians Hospitalizations, surgeries, and ER visits in previous 12 months Vitals Screenings to include cognitive, depression, and falls Referrals and appointments  In addition, I have reviewed and discussed with patient certain preventive protocols, quality metrics, and best practice recommendations. A written personalized care plan for preventive services as well as general preventive health recommendations were provided to patient.     Lorrene Reid, LPN   [id number]   Nurse Notes: none

## 2023-01-15 DIAGNOSIS — M9901 Segmental and somatic dysfunction of cervical region: Secondary | ICD-10-CM | POA: Diagnosis not present

## 2023-01-15 DIAGNOSIS — M9902 Segmental and somatic dysfunction of thoracic region: Secondary | ICD-10-CM | POA: Diagnosis not present

## 2023-01-15 DIAGNOSIS — S134XXA Sprain of ligaments of cervical spine, initial encounter: Secondary | ICD-10-CM | POA: Diagnosis not present

## 2023-01-15 DIAGNOSIS — S233XXA Sprain of ligaments of thoracic spine, initial encounter: Secondary | ICD-10-CM | POA: Diagnosis not present

## 2023-01-15 DIAGNOSIS — S338XXA Sprain of other parts of lumbar spine and pelvis, initial encounter: Secondary | ICD-10-CM | POA: Diagnosis not present

## 2023-01-15 DIAGNOSIS — M9903 Segmental and somatic dysfunction of lumbar region: Secondary | ICD-10-CM | POA: Diagnosis not present

## 2023-02-12 DIAGNOSIS — S134XXA Sprain of ligaments of cervical spine, initial encounter: Secondary | ICD-10-CM | POA: Diagnosis not present

## 2023-02-12 DIAGNOSIS — S233XXA Sprain of ligaments of thoracic spine, initial encounter: Secondary | ICD-10-CM | POA: Diagnosis not present

## 2023-02-12 DIAGNOSIS — M9901 Segmental and somatic dysfunction of cervical region: Secondary | ICD-10-CM | POA: Diagnosis not present

## 2023-02-12 DIAGNOSIS — S338XXA Sprain of other parts of lumbar spine and pelvis, initial encounter: Secondary | ICD-10-CM | POA: Diagnosis not present

## 2023-02-12 DIAGNOSIS — M9902 Segmental and somatic dysfunction of thoracic region: Secondary | ICD-10-CM | POA: Diagnosis not present

## 2023-02-12 DIAGNOSIS — M9903 Segmental and somatic dysfunction of lumbar region: Secondary | ICD-10-CM | POA: Diagnosis not present

## 2023-03-11 DIAGNOSIS — M9903 Segmental and somatic dysfunction of lumbar region: Secondary | ICD-10-CM | POA: Diagnosis not present

## 2023-03-11 DIAGNOSIS — S134XXA Sprain of ligaments of cervical spine, initial encounter: Secondary | ICD-10-CM | POA: Diagnosis not present

## 2023-03-11 DIAGNOSIS — S338XXA Sprain of other parts of lumbar spine and pelvis, initial encounter: Secondary | ICD-10-CM | POA: Diagnosis not present

## 2023-03-11 DIAGNOSIS — M9902 Segmental and somatic dysfunction of thoracic region: Secondary | ICD-10-CM | POA: Diagnosis not present

## 2023-03-11 DIAGNOSIS — M9901 Segmental and somatic dysfunction of cervical region: Secondary | ICD-10-CM | POA: Diagnosis not present

## 2023-03-11 DIAGNOSIS — S233XXA Sprain of ligaments of thoracic spine, initial encounter: Secondary | ICD-10-CM | POA: Diagnosis not present

## 2023-03-23 DIAGNOSIS — R809 Proteinuria, unspecified: Secondary | ICD-10-CM | POA: Diagnosis not present

## 2023-03-23 DIAGNOSIS — N184 Chronic kidney disease, stage 4 (severe): Secondary | ICD-10-CM | POA: Diagnosis not present

## 2023-03-23 DIAGNOSIS — N2581 Secondary hyperparathyroidism of renal origin: Secondary | ICD-10-CM | POA: Diagnosis not present

## 2023-03-23 DIAGNOSIS — I129 Hypertensive chronic kidney disease with stage 1 through stage 4 chronic kidney disease, or unspecified chronic kidney disease: Secondary | ICD-10-CM | POA: Diagnosis not present

## 2023-04-08 DIAGNOSIS — S134XXA Sprain of ligaments of cervical spine, initial encounter: Secondary | ICD-10-CM | POA: Diagnosis not present

## 2023-04-08 DIAGNOSIS — M9903 Segmental and somatic dysfunction of lumbar region: Secondary | ICD-10-CM | POA: Diagnosis not present

## 2023-04-08 DIAGNOSIS — S338XXA Sprain of other parts of lumbar spine and pelvis, initial encounter: Secondary | ICD-10-CM | POA: Diagnosis not present

## 2023-04-08 DIAGNOSIS — S233XXA Sprain of ligaments of thoracic spine, initial encounter: Secondary | ICD-10-CM | POA: Diagnosis not present

## 2023-04-08 DIAGNOSIS — M9901 Segmental and somatic dysfunction of cervical region: Secondary | ICD-10-CM | POA: Diagnosis not present

## 2023-04-08 DIAGNOSIS — M9902 Segmental and somatic dysfunction of thoracic region: Secondary | ICD-10-CM | POA: Diagnosis not present

## 2023-04-22 ENCOUNTER — Other Ambulatory Visit: Payer: Self-pay | Admitting: *Deleted

## 2023-04-22 ENCOUNTER — Other Ambulatory Visit: Payer: Medicare Other

## 2023-04-22 DIAGNOSIS — N184 Chronic kidney disease, stage 4 (severe): Secondary | ICD-10-CM

## 2023-04-23 ENCOUNTER — Other Ambulatory Visit: Payer: Self-pay | Admitting: Family Medicine

## 2023-04-23 DIAGNOSIS — K2100 Gastro-esophageal reflux disease with esophagitis, without bleeding: Secondary | ICD-10-CM

## 2023-04-23 LAB — URINALYSIS
Bilirubin, UA: NEGATIVE
Glucose, UA: NEGATIVE
Ketones, UA: NEGATIVE
Leukocytes,UA: NEGATIVE
Nitrite, UA: NEGATIVE
RBC, UA: NEGATIVE
Specific Gravity, UA: 1.015 (ref 1.005–1.030)
Urobilinogen, Ur: 0.2 mg/dL (ref 0.2–1.0)
pH, UA: 7 (ref 5.0–7.5)

## 2023-04-23 LAB — PROTEIN / CREATININE RATIO, URINE
Creatinine, Urine: 97.9 mg/dL
Protein, Ur: 37.5 mg/dL
Protein/Creat Ratio: 383 mg/g creat — ABNORMAL HIGH (ref 0–200)

## 2023-05-07 ENCOUNTER — Ambulatory Visit (INDEPENDENT_AMBULATORY_CARE_PROVIDER_SITE_OTHER): Payer: Medicare Other | Admitting: Family Medicine

## 2023-05-07 ENCOUNTER — Encounter: Payer: Self-pay | Admitting: Family Medicine

## 2023-05-07 VITALS — Temp 97.9°F | Ht 71.0 in | Wt 184.4 lb

## 2023-05-07 DIAGNOSIS — Q613 Polycystic kidney, unspecified: Secondary | ICD-10-CM | POA: Diagnosis not present

## 2023-05-07 DIAGNOSIS — D631 Anemia in chronic kidney disease: Secondary | ICD-10-CM

## 2023-05-07 DIAGNOSIS — Z23 Encounter for immunization: Secondary | ICD-10-CM | POA: Diagnosis not present

## 2023-05-07 DIAGNOSIS — I10 Essential (primary) hypertension: Secondary | ICD-10-CM

## 2023-05-07 DIAGNOSIS — N184 Chronic kidney disease, stage 4 (severe): Secondary | ICD-10-CM | POA: Diagnosis not present

## 2023-05-07 DIAGNOSIS — I129 Hypertensive chronic kidney disease with stage 1 through stage 4 chronic kidney disease, or unspecified chronic kidney disease: Secondary | ICD-10-CM

## 2023-05-07 DIAGNOSIS — N189 Chronic kidney disease, unspecified: Secondary | ICD-10-CM

## 2023-05-07 DIAGNOSIS — K2100 Gastro-esophageal reflux disease with esophagitis, without bleeding: Secondary | ICD-10-CM

## 2023-05-07 DIAGNOSIS — M1A3790 Chronic gout due to renal impairment, unspecified ankle and foot, without tophus (tophi): Secondary | ICD-10-CM

## 2023-05-07 DIAGNOSIS — E782 Mixed hyperlipidemia: Secondary | ICD-10-CM | POA: Diagnosis not present

## 2023-05-07 MED ORDER — FAMOTIDINE 40 MG PO TABS: 40.0000 mg | ORAL_TABLET | Freq: Every day | ORAL | 3 refills | Status: AC

## 2023-05-07 MED ORDER — VITAMIN D (ERGOCALCIFEROL) 1.25 MG (50000 UNIT) PO CAPS: 50000.0000 [IU] | ORAL_CAPSULE | ORAL | 3 refills | Status: AC

## 2023-05-07 MED ORDER — VALSARTAN 160 MG PO TABS
160.0000 mg | ORAL_TABLET | Freq: Every day | ORAL | 3 refills | Status: DC
Start: 2023-05-07 — End: 2024-05-24

## 2023-05-07 MED ORDER — ALLOPURINOL 100 MG PO TABS
200.0000 mg | ORAL_TABLET | Freq: Every day | ORAL | 3 refills | Status: DC
Start: 2023-05-07 — End: 2024-05-24

## 2023-05-07 MED ORDER — PANTOPRAZOLE SODIUM 40 MG PO TBEC
40.0000 mg | DELAYED_RELEASE_TABLET | Freq: Every day | ORAL | 3 refills | Status: DC
Start: 2023-05-07 — End: 2024-05-24

## 2023-05-07 NOTE — Progress Notes (Addendum)
Subjective:  Patient ID: Michael Cervantes, male    DOB: 10-Dec-1945  Age: 77 y.o. MRN: [id number]  CC: Medical Management of Chronic Issues   HPI Michael Cervantes presents for  follow-up of hypertension. Patient has no history of headache chest pain or shortness of breath or recent cough. Patient also denies symptoms of TIA such as focal numbness or weakness. Patient denies side effects from medication. States taking it regularly.   in for follow-up of elevated cholesterol. Doing well without complaints on current medication. Denies side effects of statin including myalgia and arthralgia and nausea. Currently no chest pain, shortness of breath or other cardiovascular related symptoms noted.  Patient in for follow-up of GERD. Currently asymptomatic taking  PPI daily. There is no chest pain or heartburn. No hematemesis and no melena. No dysphagia or choking. Onset is remote. Progression is stable. Complicating factors, none.    History Michael Cervantes has a past medical history of Anemia, Arthritis, Bulging lumbar disc, Cellulitis (08/2014), Gout, Hyperlipidemia, OA (osteoarthritis) of foot, Polycystic kidney disease, and Sciatica (2016).   Michael Cervantes has a past surgical history that includes Cyst excision and Tonsillectomy.   His family history includes Alzheimer's disease in his mother; Arthritis in his sister; Cancer in his father; Dementia in his mother.Michael Cervantes reports that Michael Cervantes quit smoking about 33 years ago. His smoking use included cigarettes. Michael Cervantes started smoking about 68 years ago. Michael Cervantes has a 35 pack-year smoking history. His smokeless tobacco use includes snuff. Michael Cervantes reports current alcohol use of about 2.0 standard drinks of alcohol per week. Michael Cervantes reports that Michael Cervantes does not use drugs.  Current Outpatient Medications on File Prior to Visit  Medication Sig Dispense Refill   atorvastatin (LIPITOR) 40 MG tablet TAKE 1 TABLET BY MOUTH EVERY DAY FOR CHOLESTEROL 90 tablet 2   colchicine 0.6 MG tablet TAKE 2 TABS BY MOUTH when   Gout flares up THEN 1 AN HOUR LATER, THEN 1 TWICE A DAY UNTIL COMPLETE RELIEF THEN 1 DAILY 40 tablet 3   fluocinonide (LIDEX) 0.05 % external solution Apply 1 Application topically 2 (two) times daily. 60 mL 3   furosemide (LASIX) 40 MG tablet Take 1 tablet (40 mg total) by mouth daily. 90 tablet 2   Multiple Vitamins-Minerals (ONE-A-DAY MENS HEALTH FORMULA PO) Take 1 tablet by mouth daily.     sodium bicarbonate 650 MG tablet Take 650 mg by mouth 2 (two) times daily.     Triamcinolone Acetonide (TRIAMCINOLONE 0.1 % CREAM : EUCERIN) CREA Apply 1 application topically 3 (three) times daily as needed. 1 each 5   No current facility-administered medications on file prior to visit.    ROS Review of Systems  Constitutional:  Negative for fever.  Respiratory:  Negative for shortness of breath.   Cardiovascular:  Negative for chest pain.  Musculoskeletal:  Negative for arthralgias.  Skin:  Negative for rash.    Objective:  Temp 97.9 F (36.6 C)   Ht 5\' 11"  (1.803 m)   Wt 184 lb 6.4 oz (83.6 kg)   BMI 25.72 kg/m   BP Readings from Last 3 Encounters:  11/05/22 113/66  04/29/22 117/77  10/23/21 123/73    Wt Readings from Last 3 Encounters:  05/07/23 184 lb 6.4 oz (83.6 kg)  12/23/22 185 lb (83.9 kg)  11/05/22 186 lb 12.8 oz (84.7 kg)     Physical Exam Vitals reviewed.  Constitutional:      Appearance: Michael Cervantes is well-developed.  HENT:     Head: Normocephalic  and atraumatic.     Right Ear: External ear normal.     Left Ear: External ear normal.     Mouth/Throat:     Pharynx: No oropharyngeal exudate or posterior oropharyngeal erythema.  Eyes:     Pupils: Pupils are equal, round, and reactive to light.  Cardiovascular:     Rate and Rhythm: Normal rate and regular rhythm.     Heart sounds: No murmur heard. Pulmonary:     Effort: No respiratory distress.     Breath sounds: Normal breath sounds.  Musculoskeletal:     Cervical back: Normal range of motion and neck supple.   Neurological:     Mental Status: Michael Cervantes is alert and oriented to person, place, and time.       Assessment & Plan:   Michael Cervantes was seen today for medical management of chronic issues.  Diagnoses and all orders for this visit:  Primary hypertension -     valsartan (DIOVAN) 160 MG tablet; Take 1 tablet (160 mg total) by mouth daily.  Polycystic kidney disease  Gastroesophageal reflux disease with esophagitis without hemorrhage -     pantoprazole (PROTONIX) 40 MG tablet; Take 1 tablet (40 mg total) by mouth daily.  Mixed hyperlipidemia  Anemia associated with chronic renal failure  Chronic gout due to renal impairment involving foot without tophus, unspecified laterality -     allopurinol (ZYLOPRIM) 100 MG tablet; Take 2 tablets (200 mg total) by mouth daily.  Chronic renal failure (CRF), stage 4 (severe) (HCC)  Need for influenza vaccination -     Flu Vaccine Trivalent High Dose (Fluad)  Other orders -     famotidine (PEPCID) 40 MG tablet; Take 1 tablet (40 mg total) by mouth daily. -     Vitamin D, Ergocalciferol, (DRISDOL) 1.25 MG (50000 UNIT) CAPS capsule; Take 1 capsule (50,000 Units total) by mouth every 7 (seven) days.   Allergies as of 05/07/2023       Reactions   Cefaclor Rash        Medication List        Accurate as of May 07, 2023 11:59 PM. If you have any questions, ask your nurse or doctor.          allopurinol 100 MG tablet Commonly known as: ZYLOPRIM Take 2 tablets (200 mg total) by mouth daily.   atorvastatin 40 MG tablet Commonly known as: LIPITOR TAKE 1 TABLET BY MOUTH EVERY DAY FOR CHOLESTEROL   colchicine 0.6 MG tablet TAKE 2 TABS BY MOUTH when  Gout flares up THEN 1 AN HOUR LATER, THEN 1 TWICE A DAY UNTIL COMPLETE RELIEF THEN 1 DAILY   famotidine 40 MG tablet Commonly known as: PEPCID Take 1 tablet (40 mg total) by mouth daily.   fluocinonide 0.05 % external solution Commonly known as: LIDEX Apply 1 Application topically 2  (two) times daily.   furosemide 40 MG tablet Commonly known as: LASIX Take 1 tablet (40 mg total) by mouth daily.   ONE-A-DAY MENS HEALTH FORMULA PO Take 1 tablet by mouth daily.   pantoprazole 40 MG tablet Commonly known as: PROTONIX Take 1 tablet (40 mg total) by mouth daily.   sodium bicarbonate 650 MG tablet Take 650 mg by mouth 2 (two) times daily.   triamcinolone 0.1 % cream : eucerin Crea Apply 1 application topically 3 (three) times daily as needed.   valsartan 160 MG tablet Commonly known as: DIOVAN Take 1 tablet (160 mg total) by mouth daily.   Vitamin  D (Ergocalciferol) 1.25 MG (50000 UNIT) Caps capsule Commonly known as: DRISDOL Take 1 capsule (50,000 Units total) by mouth every 7 (seven) days.        Meds ordered this encounter  Medications   allopurinol (ZYLOPRIM) 100 MG tablet    Sig: Take 2 tablets (200 mg total) by mouth daily.    Dispense:  180 tablet    Refill:  3   famotidine (PEPCID) 40 MG tablet    Sig: Take 1 tablet (40 mg total) by mouth daily.    Dispense:  90 tablet    Refill:  3   pantoprazole (PROTONIX) 40 MG tablet    Sig: Take 1 tablet (40 mg total) by mouth daily.    Dispense:  90 tablet    Refill:  3   valsartan (DIOVAN) 160 MG tablet    Sig: Take 1 tablet (160 mg total) by mouth daily.    Dispense:  90 tablet    Refill:  3   Vitamin D, Ergocalciferol, (DRISDOL) 1.25 MG (50000 UNIT) CAPS capsule    Sig: Take 1 capsule (50,000 Units total) by mouth every 7 (seven) days.    Dispense:  13 capsule    Refill:  3    Back exercises, chiropractic, magnesium oild and salon pas for back pain  Follow-up: Return in about 6 months (around 11/05/2023).  Mechele Claude, M.D.

## 2023-05-07 NOTE — Patient Instructions (Signed)

## 2023-05-07 NOTE — Addendum Note (Signed)
Addended by: Adella Hare B on: 05/07/2023 03:06 PM   Modules accepted: Orders

## 2023-08-04 ENCOUNTER — Other Ambulatory Visit: Payer: Self-pay | Admitting: Family Medicine

## 2023-08-04 DIAGNOSIS — E782 Mixed hyperlipidemia: Secondary | ICD-10-CM

## 2023-08-19 ENCOUNTER — Other Ambulatory Visit: Payer: Self-pay | Admitting: Family Medicine

## 2023-08-19 DIAGNOSIS — N184 Chronic kidney disease, stage 4 (severe): Secondary | ICD-10-CM

## 2023-10-28 ENCOUNTER — Other Ambulatory Visit: Payer: Self-pay | Admitting: Family Medicine

## 2023-10-28 DIAGNOSIS — E782 Mixed hyperlipidemia: Secondary | ICD-10-CM

## 2023-11-05 ENCOUNTER — Encounter: Payer: Self-pay | Admitting: Family Medicine

## 2023-11-05 ENCOUNTER — Ambulatory Visit (INDEPENDENT_AMBULATORY_CARE_PROVIDER_SITE_OTHER): Payer: Medicare Other | Admitting: Family Medicine

## 2023-11-05 VITALS — BP 127/77 | HR 88 | Temp 98.1°F | Ht 71.0 in

## 2023-11-05 DIAGNOSIS — Q613 Polycystic kidney, unspecified: Secondary | ICD-10-CM

## 2023-11-05 DIAGNOSIS — E782 Mixed hyperlipidemia: Secondary | ICD-10-CM | POA: Diagnosis not present

## 2023-11-05 DIAGNOSIS — K2100 Gastro-esophageal reflux disease with esophagitis, without bleeding: Secondary | ICD-10-CM | POA: Diagnosis not present

## 2023-11-05 DIAGNOSIS — N184 Chronic kidney disease, stage 4 (severe): Secondary | ICD-10-CM | POA: Diagnosis not present

## 2023-11-05 DIAGNOSIS — M1A3790 Chronic gout due to renal impairment, unspecified ankle and foot, without tophus (tophi): Secondary | ICD-10-CM

## 2023-11-05 DIAGNOSIS — I129 Hypertensive chronic kidney disease with stage 1 through stage 4 chronic kidney disease, or unspecified chronic kidney disease: Secondary | ICD-10-CM | POA: Diagnosis not present

## 2023-11-05 DIAGNOSIS — N183 Chronic kidney disease, stage 3 unspecified: Secondary | ICD-10-CM | POA: Diagnosis not present

## 2023-11-05 LAB — LIPID PANEL

## 2023-11-05 MED ORDER — CLOTRIMAZOLE-BETAMETHASONE 1-0.05 % EX CREA: 1.0000 | TOPICAL_CREAM | Freq: Every day | CUTANEOUS | 3 refills | Status: AC

## 2023-11-05 NOTE — Progress Notes (Addendum)
 Subjective:  Patient ID: Michael Cervantes, male    DOB: 13-Feb-1946  Age: 78 y.o. MRN: [id number]  CC: Medical Management of Chronic Issues   HPI Michael Cervantes presents for  follow-up of hypertension. Patient has no history of headache chest pain or shortness of breath or recent cough. Patient also denies symptoms of TIA such as focal numbness or weakness. Patient denies side effects from medication. States taking it regularly.  Right kidney pain increasing. Sees his nephrologist in 4 days.   Groin itching. Pilonidal cyst   History Michael Cervantes has a past medical history of Anemia, Arthritis, Bulging lumbar disc, Cellulitis (08/2014), Gout, Hyperlipidemia, OA (osteoarthritis) of foot, Polycystic kidney disease, and Sciatica (2016).   Michael Cervantes has a past surgical history that includes Cyst excision and Tonsillectomy.   His family history includes Alzheimer's disease in his mother; Arthritis in his sister; Cancer in his father; Dementia in his mother.Michael Cervantes reports that Michael Cervantes quit smoking about 34 years ago. His smoking use included cigarettes. Michael Cervantes started smoking about 69 years ago. Michael Cervantes has a 35 pack-year smoking history. His smokeless tobacco use includes snuff. Michael Cervantes reports current alcohol use of about 2.0 standard drinks of alcohol per week. Michael Cervantes reports that Michael Cervantes does not use drugs.  Current Outpatient Medications on File Prior to Visit  Medication Sig Dispense Refill   allopurinol (ZYLOPRIM) 100 MG tablet Take 2 tablets (200 mg total) by mouth daily. 180 tablet 3   atorvastatin (LIPITOR) 40 MG tablet TAKE 1 TABLET BY MOUTH EVERY DAY FOR CHOLESTEROL 90 tablet 0   colchicine 0.6 MG tablet TAKE 2 TABS BY MOUTH when  Gout flares up THEN 1 AN HOUR LATER, THEN 1 TWICE A DAY UNTIL COMPLETE RELIEF THEN 1 DAILY 40 tablet 3   famotidine (PEPCID) 40 MG tablet Take 1 tablet (40 mg total) by mouth daily. 90 tablet 3   furosemide (LASIX) 40 MG tablet TAKE 1 TABLET BY MOUTH EVERY DAY 90 tablet 0   Multiple Vitamins-Minerals  (ONE-A-DAY MENS HEALTH FORMULA PO) Take 1 tablet by mouth daily.     pantoprazole (PROTONIX) 40 MG tablet Take 1 tablet (40 mg total) by mouth daily. 90 tablet 3   sodium bicarbonate 650 MG tablet Take 650 mg by mouth 2 (two) times daily.     valsartan (DIOVAN) 160 MG tablet Take 1 tablet (160 mg total) by mouth daily. 90 tablet 3   Vitamin D, Ergocalciferol, (DRISDOL) 1.25 MG (50000 UNIT) CAPS capsule Take 1 capsule (50,000 Units total) by mouth every 7 (seven) days. 13 capsule 3   No current facility-administered medications on file prior to visit.    ROS Review of Systems  Constitutional:  Positive for unexpected weight change (gaining even though Michael Cervantes doesn't eat much). Negative for fever.  Respiratory:  Negative for shortness of breath.   Cardiovascular:  Negative for chest pain.  Musculoskeletal:  Negative for arthralgias.  Skin:  Negative for rash.    Objective:  BP 127/77   Pulse 88   Temp 98.1 F (36.7 C)   Ht 5\' 11"  (1.803 m)   SpO2 99%   BMI 25.72 kg/m   BP Readings from Last 3 Encounters:  11/05/23 127/77  11/05/22 113/66  04/29/22 117/77    Wt Readings from Last 3 Encounters:  05/07/23 184 lb 6.4 oz (83.6 kg)  12/23/22 185 lb (83.9 kg)  11/05/22 186 lb 12.8 oz (84.7 kg)     Physical Exam Constitutional:      General: Michael Cervantes is  not in acute distress.    Appearance: Michael Cervantes is well-developed.  HENT:     Head: Normocephalic and atraumatic.     Right Ear: External ear normal.     Left Ear: External ear normal.     Nose: Nose normal.  Eyes:     Conjunctiva/sclera: Conjunctivae normal.     Pupils: Pupils are equal, round, and reactive to light.  Cardiovascular:     Rate and Rhythm: Normal rate and regular rhythm.     Heart sounds: Normal heart sounds. No murmur heard. Pulmonary:     Effort: Pulmonary effort is normal. No respiratory distress.     Breath sounds: Normal breath sounds. No wheezing or rales.  Abdominal:     General: There is distension.      Palpations: Abdomen is soft. There is no mass.     Tenderness: There is no abdominal tenderness. There is no guarding.     Comments: Dullness for percussion iindicative of fluid   Musculoskeletal:        General: Normal range of motion.     Cervical back: Normal range of motion and neck supple.  Skin:    General: Skin is warm and dry.  Neurological:     Mental Status: Michael Cervantes is alert and oriented to person, place, and time.     Deep Tendon Reflexes: Reflexes are normal and symmetric.  Psychiatric:        Behavior: Behavior normal.        Thought Content: Thought content normal.        Judgment: Judgment normal.       Assessment & Plan:  Polycystic kidney disease -     CBC with Differential/Platelet -     CMP14+EGFR  Gastroesophageal reflux disease with esophagitis without hemorrhage -     CBC with Differential/Platelet -     CMP14+EGFR  Mixed hyperlipidemia -     CBC with Differential/Platelet -     CMP14+EGFR -     Lipid panel  Chronic renal failure (CRF), stage 4 (severe) (HCC) -     CBC with Differential/Platelet -     CMP14+EGFR -     Uric acid  Chronic gout due to renal impairment involving foot without tophus, unspecified laterality -     Uric acid  Other orders -     Clotrimazole-Betamethasone; Apply 1 Application topically daily.  Dispense: 45 g; Refill: 3    Allergies as of 11/05/2023       Reactions   Cefaclor Rash        Medication List        Accurate as of November 05, 2023 11:59 PM. If you have any questions, ask your nurse or doctor.          STOP taking these medications    fluocinonide 0.05 % external solution Commonly known as: LIDEX Stopped by: Thinh Cuccaro   triamcinolone 0.1 % cream : eucerin Crea Stopped by: Paden Kuras       TAKE these medications    allopurinol 100 MG tablet Commonly known as: ZYLOPRIM Take 2 tablets (200 mg total) by mouth daily.   atorvastatin 40 MG tablet Commonly known as: LIPITOR TAKE 1  TABLET BY MOUTH EVERY DAY FOR CHOLESTEROL   clotrimazole-betamethasone cream Commonly known as: LOTRISONE Apply 1 Application topically daily. Started by: Savahna Casados   colchicine 0.6 MG tablet TAKE 2 TABS BY MOUTH when  Gout flares up THEN 1 AN HOUR LATER, THEN 1 TWICE A DAY  UNTIL COMPLETE RELIEF THEN 1 DAILY   famotidine 40 MG tablet Commonly known as: PEPCID Take 1 tablet (40 mg total) by mouth daily.   furosemide 40 MG tablet Commonly known as: LASIX TAKE 1 TABLET BY MOUTH EVERY DAY   ONE-A-DAY MENS HEALTH FORMULA PO Take 1 tablet by mouth daily.   pantoprazole 40 MG tablet Commonly known as: PROTONIX Take 1 tablet (40 mg total) by mouth daily.   sodium bicarbonate 650 MG tablet Take 650 mg by mouth 2 (two) times daily.   valsartan 160 MG tablet Commonly known as: DIOVAN Take 1 tablet (160 mg total) by mouth daily.   Vitamin D (Ergocalciferol) 1.25 MG (50000 UNIT) Caps capsule Commonly known as: DRISDOL Take 1 capsule (50,000 Units total) by mouth every 7 (seven) days.       Holding off for now on eval of distended abdomen. Michael Cervantes is going to his Kidney MD in 4 days. Will see if Michael Cervantes feels kidney related and await his recommendation.   Follow-up: Return in about 6 months (around 05/06/2024) for [facility] physical.  Roise Cleaver, M.D.

## 2023-11-06 LAB — CMP14+EGFR
ALT: 26 IU/L (ref 0–44)
AST: 35 IU/L (ref 0–40)
Albumin: 3.9 g/dL (ref 3.8–4.8)
Alkaline Phosphatase: 169 IU/L — ABNORMAL HIGH (ref 44–121)
BUN/Creatinine Ratio: 10 (ref 10–24)
BUN: 28 mg/dL — ABNORMAL HIGH (ref 8–27)
Bilirubin Total: 0.6 mg/dL (ref 0.0–1.2)
CO2: 22 mmol/L (ref 20–29)
Calcium: 9.3 mg/dL (ref 8.6–10.2)
Chloride: 107 mmol/L — ABNORMAL HIGH (ref 96–106)
Creatinine, Ser: 2.7 mg/dL — ABNORMAL HIGH (ref 0.76–1.27)
Globulin, Total: 2.5 g/dL (ref 1.5–4.5)
Glucose: 107 mg/dL — ABNORMAL HIGH (ref 70–99)
Potassium: 4.2 mmol/L (ref 3.5–5.2)
Sodium: 146 mmol/L — ABNORMAL HIGH (ref 134–144)
Total Protein: 6.4 g/dL (ref 6.0–8.5)
eGFR: 23 mL/min/{1.73_m2} — ABNORMAL LOW (ref >59)

## 2023-11-06 LAB — LIPID PANEL
Cholesterol, Total: 130 mg/dL (ref 100–199)
HDL: 62 mg/dL (ref >39)
LDL CALC COMMENT:: 2.1 ratio (ref 0.0–5.0)
LDL Chol Calc (NIH): 41 mg/dL (ref 0–99)
Triglycerides: 163 mg/dL — ABNORMAL HIGH (ref 0–149)
VLDL Cholesterol Cal: 27 mg/dL (ref 5–40)

## 2023-11-06 LAB — CBC WITH DIFFERENTIAL/PLATELET
Basophils Absolute: 0 10*3/uL (ref 0.0–0.2)
Basos: 0 %
EOS (ABSOLUTE): 0.1 10*3/uL (ref 0.0–0.4)
Eos: 1 %
Hematocrit: 35.7 % — ABNORMAL LOW (ref 37.5–51.0)
Hemoglobin: 12.3 g/dL — ABNORMAL LOW (ref 13.0–17.7)
Immature Grans (Abs): 0 10*3/uL (ref 0.0–0.1)
Immature Granulocytes: 0 %
Lymphocytes Absolute: 1.9 10*3/uL (ref 0.7–3.1)
Lymphs: 21 %
MCH: 34.9 pg — ABNORMAL HIGH (ref 26.6–33.0)
MCHC: 34.5 g/dL (ref 31.5–35.7)
MCV: 101 fL — ABNORMAL HIGH (ref 79–97)
Monocytes Absolute: 1 10*3/uL — ABNORMAL HIGH (ref 0.1–0.9)
Monocytes: 11 %
Neutrophils Absolute: 6 10*3/uL (ref 1.4–7.0)
Neutrophils: 67 %
Platelets: 198 10*3/uL (ref 150–450)
RBC: 3.52 x10E6/uL — ABNORMAL LOW (ref 4.14–5.80)
RDW: 12.4 % (ref 11.6–15.4)
WBC: 9 10*3/uL (ref 3.4–10.8)

## 2023-11-06 LAB — URIC ACID: Uric Acid: 4.5 mg/dL (ref 3.8–8.4)

## 2023-11-08 ENCOUNTER — Encounter: Payer: Self-pay | Admitting: Family Medicine

## 2023-11-08 NOTE — Progress Notes (Signed)
Hello [name],  Your lab result is normal and/or stable.Some minor variations that are not significant are commonly marked abnormal, but do not represent any medical problem for you.  Best regards, Warren Stacks, M.D.

## 2023-11-09 DIAGNOSIS — D631 Anemia in chronic kidney disease: Secondary | ICD-10-CM | POA: Diagnosis not present

## 2023-11-09 DIAGNOSIS — N2581 Secondary hyperparathyroidism of renal origin: Secondary | ICD-10-CM | POA: Diagnosis not present

## 2023-11-09 DIAGNOSIS — I129 Hypertensive chronic kidney disease with stage 1 through stage 4 chronic kidney disease, or unspecified chronic kidney disease: Secondary | ICD-10-CM | POA: Diagnosis not present

## 2023-11-09 DIAGNOSIS — N184 Chronic kidney disease, stage 4 (severe): Secondary | ICD-10-CM | POA: Diagnosis not present

## 2023-11-14 ENCOUNTER — Other Ambulatory Visit: Payer: Self-pay | Admitting: Family Medicine

## 2023-11-14 DIAGNOSIS — N184 Chronic kidney disease, stage 4 (severe): Secondary | ICD-10-CM

## 2023-11-18 DIAGNOSIS — H353131 Nonexudative age-related macular degeneration, bilateral, early dry stage: Secondary | ICD-10-CM | POA: Diagnosis not present

## 2023-11-26 ENCOUNTER — Encounter: Payer: Self-pay | Admitting: *Deleted

## 2023-12-24 ENCOUNTER — Ambulatory Visit: Payer: Medicare Other

## 2023-12-24 VITALS — BP 127/77 | HR 88 | Ht 71.0 in | Wt 184.0 lb

## 2023-12-24 DIAGNOSIS — Z0000 Encounter for general adult medical examination without abnormal findings: Secondary | ICD-10-CM | POA: Diagnosis not present

## 2023-12-24 DIAGNOSIS — Q612 Polycystic kidney, adult type: Secondary | ICD-10-CM | POA: Insufficient documentation

## 2023-12-24 NOTE — Patient Instructions (Addendum)
 [name] , Thank you for taking time out of your busy schedule to complete your Annual Wellness Visit with me. I enjoyed our conversation and look forward to speaking with you again next year. I, as well as your care team,  appreciate your ongoing commitment to your health goals. Please review the following plan we discussed and let me know if I can assist you in the future. Your Game plan/ To Do List    Follow up Visits: Next Medicare AWV with our clinical staff: 12/26/24 at 8:40a.m    Next Office Visit with your provider: 05/25/25 at 1:10p.m. Clinician Recommendations:  Aim for 30 minutes of exercise or brisk walking, 6-8 glasses of water, and 5 servings of fruits and vegetables each day. N/a      This is a list of the screening recommended for you and due dates:  Health Maintenance  Topic Date Due   Flu Shot  02/26/2024   COVID-19 Vaccine (5 - Moderna risk 2024-25 season) 04/13/2024   Medicare Annual Wellness Visit  12/23/2024   DTaP/Tdap/Td vaccine (3 - Td or Tdap) 10/24/2031   Pneumonia Vaccine  Completed   Hepatitis C Screening  Completed   Zoster (Shingles) Vaccine  Completed   HPV Vaccine  Aged Out   Meningitis B Vaccine  Aged Out   Colon Cancer Screening  Discontinued    Advanced directives: (Copy Requested) Please bring a copy of your health care power of attorney and living will to the office to be added to your chart at your convenience. You can mail to [facility] [address] [place], [place] [address] or email to [email or web address] Advance Care Planning is important because it:  [x]  Makes sure you receive the medical care that is consistent with your values, goals, and preferences  [x]  It provides guidance to your family and loved ones and reduces their decisional burden about whether or not they are making the right decisions based on your wishes.  Follow the link provided in your after visit summary or read over the paperwork we have mailed  to you to help you started getting your Advance Directives in place. If you need assistance in completing these, please reach out to us  so that we can help you!  See attachments for Preventive Care and Fall Prevention Tips.

## 2023-12-24 NOTE — Progress Notes (Signed)
 6  Subjective:   Michael Cervantes is a 78 y.o. who presents for a Medicare Wellness preventive visit.  As a reminder, Annual Wellness Visits don't include a physical exam, and some assessments may be limited, especially if this visit is performed virtually. We may recommend an in-person follow-up visit with your provider if needed.  Visit Complete: Virtual I connected with  Michael Cervantes on 12/24/23 by a audio enabled telemedicine application and verified that I am speaking with the correct person using two identifiers.  Patient Location: Home  Provider Location: Home Office  I discussed the limitations of evaluation and management by telemedicine. The patient expressed understanding and agreed to proceed.  Vital Signs: Because this visit was a virtual/telehealth visit, some criteria may be missing or patient reported. Any vitals not documented were not able to be obtained and vitals that have been documented are patient reported.  VideoDeclined- This patient declined Librarian, academic. Therefore the visit was completed with audio only.  Persons Participating in Visit: Patient.  AWV Questionnaire: No: Patient Medicare AWV questionnaire was not completed prior to this visit.  Cardiac Risk Factors include: advanced age (>71men, >13 women);dyslipidemia;hypertension;male gender     Objective:     Today's Vitals   12/24/23 0846 12/24/23 0848  BP: 127/77   Pulse: 88   Weight: 184 lb (83.5 kg)   Height: 5\' 11"  (1.803 m)   PainSc:  3    Body mass index is 25.66 kg/m.     12/24/2023    9:03 AM 12/23/2022    8:56 AM 12/18/2021    9:14 AM 12/17/2020    9:30 AM 11/02/2019   10:45 AM 03/18/2016   10:36 AM 11/22/2014   10:29 AM  Advanced Directives  Does Patient Have a Medical Advance Directive? Yes No No No No Yes No  Type of Advance Directive Living will;Healthcare Power of [employer] Power of [place];Living will   Does patient want to make  changes to medical advance directive?      No - Patient declined   Copy of Healthcare Power of Attorney in Chart? No - copy requested     No - copy requested   Would patient like information on creating a medical advance directive?  No - Patient declined No - Patient declined Yes (MAU/Ambulatory/Procedural Areas - Information given) No - Patient declined  Yes - Educational materials given    Current Medications (verified) Outpatient Encounter Medications as of 12/24/2023  Medication Sig   allopurinol  (ZYLOPRIM ) 100 MG tablet Take 2 tablets (200 mg total) by mouth daily.   atorvastatin  (LIPITOR) 40 MG tablet TAKE 1 TABLET BY MOUTH EVERY DAY FOR CHOLESTEROL   clotrimazole -betamethasone  (LOTRISONE ) cream Apply 1 Application topically daily.   furosemide  (LASIX ) 40 MG tablet TAKE 1 TABLET BY MOUTH EVERY DAY   Multiple Vitamins-Minerals (ONE-A-DAY MENS HEALTH FORMULA PO) Take 1 tablet by mouth daily.   pantoprazole  (PROTONIX ) 40 MG tablet Take 1 tablet (40 mg total) by mouth daily.   sodium bicarbonate 650 MG tablet Take 650 mg by mouth 2 (two) times daily.   valsartan  (DIOVAN ) 160 MG tablet Take 1 tablet (160 mg total) by mouth daily.   Vitamin D , Ergocalciferol , (DRISDOL ) 1.25 MG (50000 UNIT) CAPS capsule Take 1 capsule (50,000 Units total) by mouth every 7 (seven) days.   colchicine  0.6 MG tablet TAKE 2 TABS BY MOUTH when  Gout flares up THEN 1 AN HOUR LATER, THEN 1 TWICE A  DAY UNTIL COMPLETE RELIEF THEN 1 DAILY (Patient not taking: Reported on 12/24/2023)   famotidine  (PEPCID ) 40 MG tablet Take 1 tablet (40 mg total) by mouth daily. (Patient not taking: Reported on 12/24/2023)   No facility-administered encounter medications on file as of 12/24/2023.    Allergies (verified) Cefaclor   History: Past Medical History:  Diagnosis Date   Anemia    Arthritis    Bulging lumbar disc    Cellulitis 08/2014   rt foot   Gout    Hyperlipidemia    OA (osteoarthritis) of foot    rt foot   Polycystic  kidney disease    Sciatica 2016   Past Surgical History:  Procedure Laterality Date   CYST EXCISION     tail bone   TONSILLECTOMY     Family History  Problem Relation Age of Onset   Dementia Mother    Alzheimer's disease Mother    Cancer Father        lymphoma met to brain   Arthritis Sister    Social History   Socioeconomic History   Marital status: Married    Spouse name: Johnanna Mylar   Number of children: 2   Years of education: Not on file   Highest education level: Some college, no degree  Occupational History   Occupation: Retired  Tobacco Use   Smoking status: Former    Current packs/day: 0.00    Average packs/day: 1 pack/day for 35.0 years (35.0 ttl pk-yrs)    Types: Cigarettes    Start date: 07/28/1954    Quit date: 07/28/1989    Years since quitting: 34.4   Smokeless tobacco: Current    Types: Snuff  Vaping Use   Vaping status: Never Used  Substance and Sexual Activity   Alcohol use: Yes    Alcohol/week: 2.0 standard drinks of alcohol    Types: 2 Cans of beer per week    Comment: socially   Drug use: No   Sexual activity: Yes  Other Topics Concern   Not on file  Social History Narrative   Lives with wife. Wife's daughter lives in [place].   Social Drivers of Corporate investment banker Strain: Low Risk  (12/24/2023)   Overall Financial Resource Strain (CARDIA)    Difficulty of Paying Living Expenses: Not hard at all  Food Insecurity: No Food Insecurity (12/24/2023)   Hunger Vital Sign    Worried About Running Out of Food in the Last Year: Never true    Ran Out of Food in the Last Year: Never true  Transportation Needs: No Transportation Needs (12/24/2023)   PRAPARE - Administrator, Civil Service (Medical): No    Lack of Transportation (Non-Medical): No  Physical Activity: Sufficiently Active (12/24/2023)   Exercise Vital Sign    Days of Exercise per Week: 7 days    Minutes of Exercise per Session: 30 min  Stress: No Stress Concern Present  (12/24/2023)   [employer] of Occupational Health - Occupational Stress Questionnaire    Feeling of Stress : Not at all  Social Connections: Socially Isolated (12/24/2023)   Social Connection and Isolation Panel [NHANES]    Frequency of Communication with Friends and Family: Once a week    Frequency of Social Gatherings with Friends and Family: Once a week    Attends Religious Services: Never    Database administrator or Organizations: Yes    Attends Banker Meetings: Never    Marital Status: Widowed  Tobacco Counseling Ready to quit: No Counseling given: Yes    Clinical Intake:  Pre-visit preparation completed: Yes  Pain : 0-10 Pain Score: 3  Pain Type: Chronic pain, Neuropathic pain Pain Location: Foot Pain Orientation: Left, Right Pain Descriptors / Indicators: Aching, Burning, Sharp, Tingling Pain Onset: More than a month ago Pain Frequency: Constant Pain Relieving Factors: none  Pain Relieving Factors: none  BMI - recorded: 25.66 Nutritional Status: BMI 25 -29 Overweight Nutritional Risks: None Diabetes: No  No results found for: "HGBA1C"   How often do you need to have someone help you when you read instructions, pamphlets, or other written materials from your doctor or pharmacy?: 1 - Never  Interpreter Needed?: No  Information entered by :: Alia T/cma   Activities of Daily Living     12/24/2023    8:54 AM  In your present state of health, do you have any difficulty performing the following activities:  Hearing? 1  Comment wear hearing aids  Vision? 0  Comment r-eye getting worst/pt is wearing glasses  Difficulty concentrating or making decisions? 0  Walking or climbing stairs? 0  Dressing or bathing? 0  Doing errands, shopping? 0  Preparing Food and eating ? N  Using the Toilet? N  In the past six months, have you accidently leaked urine? Y  Do you have problems with loss of bowel control? N  Managing your Medications? N   Managing your Finances? N  Housekeeping or managing your Housekeeping? N    Patient Care Team: Roise Cleaver, MD as PCP - General (Family Medicine) Rocco, Bjorn Bullocks, MD as Referring Physician (Nephrology) Hyland Mailman, MD as Referring Physician (Optometry)  Indicate any recent Medical Services you may have received from other than [facility] providers in the past year (date may be approximate).     Assessment:    This is a routine wellness examination for Michael Cervantes.  Hearing/Vision screen Hearing Screening - Comments:: Wears hearing aids Vision Screening - Comments:: r-eye getting worst/pt is wearing glasses/pt goes to [facility] in [place], [place]   Goals Addressed   None    Depression Screen     12/24/2023    9:05 AM 11/05/2023    2:05 PM 05/07/2023    1:04 PM 12/23/2022    8:55 AM 11/05/2022    1:22 PM 04/28/2022    3:22 PM 12/18/2021    9:10 AM  PHQ 2/9 Scores  PHQ - 2 Score 1 1 0 0 0 0 2  PHQ- 9 Score 1 3     6     Fall Risk     12/24/2023    9:00 AM 05/07/2023    1:10 PM 05/07/2023    1:04 PM 12/23/2022    8:54 AM 11/05/2022    1:22 PM  Fall Risk   Falls in the past year? 0 1 0 0 0  Number falls in past yr: 0 1  0   Injury with Fall? 0 0  0   Risk for fall due to : No Fall Risks History of fall(s)  No Fall Risks   Follow up Falls evaluation completed Falls evaluation completed  Falls prevention discussed     MEDICARE RISK AT HOME:  Medicare Risk at Home Any stairs in or around the home?: Yes If so, are there any without handrails?: No Home free of loose throw rugs in walkways, pet beds, electrical cords, etc?: Yes Adequate lighting in your home to reduce risk of falls?: Yes Life alert?: No  Use of a cane, walker or w/c?: No Grab bars in the bathroom?: No Shower chair or bench in shower?: No Elevated toilet seat or a handicapped toilet?: No  TIMED UP AND GO:  Was the test performed?  No  Cognitive Function: 6CIT completed    03/25/2017   10:52 AM 03/18/2016    10:42 AM 11/22/2014   10:34 AM  MMSE - Mini Mental State Exam  Orientation to time 5 5 5   Orientation to Place 5 5 5   Registration 3 3 3   Attention/ Calculation 3 5 5   Recall 2 3 3   Language- name 2 objects 2 2 2   Language- repeat 1 1 1   Language- follow 3 step command 3 3 3   Language- read & follow direction 1 1 1   Write a sentence 1 1 1   Copy design 1 1 1   Total score 27 30 30         12/24/2023    9:07 AM 12/23/2022    8:57 AM 12/18/2021    9:12 AM 11/02/2019   10:47 AM  6CIT Screen  What Year? 0 points 0 points 0 points 0 points  What month? 0 points 0 points 0 points 0 points  What time? 0 points 0 points 0 points 0 points  Count back from 20 0 points 0 points 0 points 0 points  Months in reverse 0 points 0 points 0 points 0 points  Repeat phrase 0 points 0 points 0 points 0 points  Total Score 0 points 0 points 0 points 0 points    Immunizations Immunization History  Administered Date(s) Administered   Fluad Quad(high Dose 65+) 04/28/2019, 04/25/2021, 04/28/2022   Fluad Trivalent(High Dose 65+) 05/07/2023   Influenza, High Dose Seasonal PF 04/23/2016, 04/23/2016, 05/03/2018, 04/28/2019, 04/12/2020   Influenza,inj,Quad PF,6+ Mos 06/05/2014, 06/18/2015, 04/24/2017   Influenza-Unspecified 06/02/2013, 06/05/2014, 06/18/2015, 05/26/2016, 04/24/2017, 05/07/2017   Moderna Covid-19 Fall Seasonal Vaccine 51yrs & older 10/12/2023   Moderna Sars-Covid-2 Vaccination 09/21/2019, 10/19/2019, 06/20/2020   Pneumococcal Conjugate-13 10/19/2014, 10/19/2014, 06/12/2015   Pneumococcal Polysaccharide-23 07/28/2005, 05/03/2018   Pneumococcal-Unspecified 07/28/2005, 05/03/2018   Tdap 12/13/2004, 10/23/2021   Zoster Recombinant(Shingrix) 04/25/2021, 10/23/2021   Zoster, Live 06/18/2015, 06/18/2015    Screening Tests Health Maintenance  Topic Date Due   INFLUENZA VACCINE  02/26/2024   COVID-19 Vaccine (5 - Moderna risk 2024-25 season) 04/13/2024   Medicare Annual Wellness (AWV)   12/23/2024   DTaP/Tdap/Td (3 - Td or Tdap) 10/24/2031   Pneumonia Vaccine 5+ Years old  Completed   Hepatitis C Screening  Completed   Zoster Vaccines- Shingrix  Completed   HPV VACCINES  Aged Out   Meningococcal B Vaccine  Aged Out   Colonoscopy  Discontinued    Health Maintenance  There are no preventive care reminders to display for this patient.  Health Maintenance Items Addressed: Health maintenance up to dated  Additional Screening:  Vision Screening: Recommended annual ophthalmology exams for early detection of glaucoma and other disorders of the eye.  Dental Screening: Recommended annual dental exams for proper oral hygiene  Community Resource Referral / Chronic Care Management: CRR required this visit?  No   CCM required this visit?  No   Plan:    I have personally reviewed and noted the following in the patient's chart:   Medical and social history Use of alcohol, tobacco or illicit drugs  Current medications and supplements including opioid prescriptions. Patient is not currently taking opioid prescriptions. Functional ability and status Nutritional status Physical activity Advanced  directives List of other physicians Hospitalizations, surgeries, and ER visits in previous 12 months Vitals Screenings to include cognitive, depression, and falls Referrals and appointments  In addition, I have reviewed and discussed with patient certain preventive protocols, quality metrics, and best practice recommendations. A written personalized care plan for preventive services as well as general preventive health recommendations were provided to patient.   Michaelle Adolphus, CMA   12/24/2023   After Visit Summary: (MyChart) Due to this being a telephonic visit, the after visit summary with patients personalized plan was offered to patient via MyChart   Notes: Nothing significant to report at this time.

## 2024-01-23 ENCOUNTER — Other Ambulatory Visit: Payer: Self-pay | Admitting: Family Medicine

## 2024-01-23 DIAGNOSIS — E782 Mixed hyperlipidemia: Secondary | ICD-10-CM

## 2024-04-04 DIAGNOSIS — R809 Proteinuria, unspecified: Secondary | ICD-10-CM | POA: Diagnosis not present

## 2024-04-04 DIAGNOSIS — N2581 Secondary hyperparathyroidism of renal origin: Secondary | ICD-10-CM | POA: Diagnosis not present

## 2024-04-04 DIAGNOSIS — D631 Anemia in chronic kidney disease: Secondary | ICD-10-CM | POA: Diagnosis not present

## 2024-04-04 DIAGNOSIS — N184 Chronic kidney disease, stage 4 (severe): Secondary | ICD-10-CM | POA: Diagnosis not present

## 2024-04-04 DIAGNOSIS — I129 Hypertensive chronic kidney disease with stage 1 through stage 4 chronic kidney disease, or unspecified chronic kidney disease: Secondary | ICD-10-CM | POA: Diagnosis not present

## 2024-04-29 ENCOUNTER — Other Ambulatory Visit: Payer: Self-pay | Admitting: Family Medicine

## 2024-04-29 DIAGNOSIS — N2889 Other specified disorders of kidney and ureter: Secondary | ICD-10-CM

## 2024-05-10 ENCOUNTER — Encounter: Admitting: Family Medicine

## 2024-05-25 ENCOUNTER — Ambulatory Visit: Payer: Self-pay | Admitting: Family Medicine

## 2024-05-25 ENCOUNTER — Ambulatory Visit (INDEPENDENT_AMBULATORY_CARE_PROVIDER_SITE_OTHER): Admitting: Family Medicine

## 2024-05-25 ENCOUNTER — Encounter: Payer: Self-pay | Admitting: Family Medicine

## 2024-05-25 VITALS — BP 112/72 | HR 111 | Temp 97.6°F | Ht 71.0 in | Wt 182.0 lb

## 2024-05-25 DIAGNOSIS — E782 Mixed hyperlipidemia: Secondary | ICD-10-CM

## 2024-05-25 DIAGNOSIS — M1A3790 Chronic gout due to renal impairment, unspecified ankle and foot, without tophus (tophi): Secondary | ICD-10-CM

## 2024-05-25 DIAGNOSIS — Z23 Encounter for immunization: Secondary | ICD-10-CM | POA: Diagnosis not present

## 2024-05-25 DIAGNOSIS — R000 Tachycardia, unspecified: Secondary | ICD-10-CM | POA: Diagnosis not present

## 2024-05-25 DIAGNOSIS — K2100 Gastro-esophageal reflux disease with esophagitis, without bleeding: Secondary | ICD-10-CM

## 2024-05-25 DIAGNOSIS — Q612 Polycystic kidney, adult type: Secondary | ICD-10-CM

## 2024-05-25 DIAGNOSIS — I10 Essential (primary) hypertension: Secondary | ICD-10-CM | POA: Diagnosis not present

## 2024-05-25 DIAGNOSIS — Q613 Polycystic kidney, unspecified: Secondary | ICD-10-CM

## 2024-05-25 LAB — URINALYSIS
Bilirubin, UA: NEGATIVE
Glucose, UA: NEGATIVE
Leukocytes,UA: NEGATIVE
Nitrite, UA: NEGATIVE
Specific Gravity, UA: 1.015 (ref 1.005–1.030)
Urobilinogen, Ur: 0.2 mg/dL (ref 0.2–1.0)
pH, UA: 6.5 (ref 5.0–7.5)

## 2024-05-25 MED ORDER — VALSARTAN 160 MG PO TABS: 160.0000 mg | ORAL_TABLET | Freq: Every day | ORAL | 3 refills | Status: AC

## 2024-05-25 MED ORDER — PANTOPRAZOLE SODIUM 40 MG PO TBEC: 40.0000 mg | DELAYED_RELEASE_TABLET | Freq: Every day | ORAL | 3 refills | Status: AC

## 2024-05-25 MED ORDER — ALLOPURINOL 100 MG PO TABS
200.0000 mg | ORAL_TABLET | Freq: Every day | ORAL | 3 refills | Status: AC
Start: 2024-05-25 — End: ?

## 2024-05-25 NOTE — Progress Notes (Signed)
 Subjective:  Patient ID: Michael Cervantes, male    DOB: 08-10-1945  Age: 78 y.o. MRN: [id number]  CC: Annual Exam and Tachycardia (90-110 bpm consistantly high for a few months)   HPI  Discussed the use of AI scribe software for clinical note transcription with the patient, who gave verbal consent to proceed.  History of Present Illness Michael Cervantes is a 78 year old male with polycystic kidney disease who presents for an annual physical exam.  He is concerned about his fast heart rate, which he believes should be around 72 instead of 102.  He has a history of polycystic kidney disease for approximately 25 years, with kidney function reported at 25% for about ten years. His creatinine levels have fluctuated between 2.4 and 3.6, currently at 2.6. He has explored dialysis options, preferring a home unit for peritoneal dialysis due to transportation issues.  No issues with urination, stating he only had to get up once at night in the last six months. No upset stomach, heartburn, diarrhea, or constipation issues. He has been on cholesterol medication for about 20 years without adverse effects.  He uses hearing aids and reports no current issues with hearing or vision. He mentions having spent significant amounts on hearing aids over the years.  For exercise, he walks his dogs about a mile and a half daily, though he has reduced the distance from two to two and a half miles due to his dog's behavior. He has been retired since 2012 and mentions a previous retirement in 2022.          05/25/2024    1:08 PM 12/24/2023    9:05 AM 11/05/2023    2:05 PM  Depression screen PHQ 2/9  Decreased Interest 1 1 1   Down, Depressed, Hopeless 0 0 0  PHQ - 2 Score 1 1 1   Altered sleeping 0 0 0  Tired, decreased energy 1 0 1  Change in appetite 1 0 1  Feeling bad or failure about yourself  0 0 0  Trouble concentrating 0 0 0  Moving slowly or fidgety/restless 0 0 0  Suicidal thoughts 0 0 0  PHQ-9  Score 3 1 3   Difficult doing work/chores  Not difficult at all Not difficult at all    History Chief has a past medical history of Anemia, Arthritis, Bulging lumbar disc, Cellulitis (08/2014), Gout, Hyperlipidemia, OA (osteoarthritis) of foot, Polycystic kidney disease, and Sciatica (2016).   He has a past surgical history that includes Cyst excision and Tonsillectomy.   His family history includes Alzheimer's disease in his mother; Arthritis in his sister; Cancer in his father; Dementia in his mother.He reports that he quit smoking about 34 years ago. His smoking use included cigarettes. He started smoking about 69 years ago. He has a 35 pack-year smoking history. His smokeless tobacco use includes snuff. He reports current alcohol use of about 2.0 standard drinks of alcohol per week. He reports that he does not use drugs.    ROS Review of Systems  Constitutional:  Negative for activity change, fatigue and unexpected weight change.  HENT:  Negative for congestion, ear pain, hearing loss, postnasal drip and trouble swallowing.   Eyes:  Negative for pain and visual disturbance.  Respiratory:  Negative for cough, chest tightness and shortness of breath.   Cardiovascular:  Negative for chest pain, palpitations and leg swelling.  Gastrointestinal:  Negative for abdominal distention, abdominal pain, blood in stool, constipation, diarrhea, nausea and vomiting.  Endocrine:  Negative for cold intolerance, heat intolerance and polydipsia.  Genitourinary:  Negative for difficulty urinating, dysuria, flank pain, frequency and urgency.  Musculoskeletal:  Negative for arthralgias and joint swelling.  Skin:  Negative for color change, rash and wound.  Neurological:  Negative for dizziness, syncope, speech difficulty, weakness, light-headedness, numbness and headaches.  Hematological:  Does not bruise/bleed easily.  Psychiatric/Behavioral:  Negative for confusion, decreased concentration, dysphoric mood  and sleep disturbance. The patient is not nervous/anxious.     Objective:  BP 112/72   Pulse (!) 111   Temp 97.6 F (36.4 C)   Ht 5' 11 (1.803 m)   Wt 182 lb (82.6 kg)   SpO2 100%   BMI 25.38 kg/m   BP Readings from Last 3 Encounters:  05/25/24 112/72  12/24/23 127/77  11/05/23 127/77    Wt Readings from Last 3 Encounters:  05/25/24 182 lb (82.6 kg)  12/24/23 184 lb (83.5 kg)  05/07/23 184 lb 6.4 oz (83.6 kg)     Physical Exam Constitutional:      Appearance: He is well-developed.  HENT:     Head: Normocephalic and atraumatic.  Eyes:     Pupils: Pupils are equal, round, and reactive to light.  Neck:     Thyroid : No thyromegaly.     Trachea: No tracheal deviation.  Cardiovascular:     Rate and Rhythm: Normal rate and regular rhythm.     Heart sounds: Normal heart sounds. No murmur heard.    No friction rub. No gallop.  Pulmonary:     Breath sounds: Normal breath sounds. No wheezing or rales.  Abdominal:     General: Bowel sounds are normal. There is no distension.     Palpations: Abdomen is soft. There is no mass.     Tenderness: There is no abdominal tenderness.     Hernia: There is no hernia in the left inguinal area.  Genitourinary:    Penis: Normal.      Testes: Normal.  Musculoskeletal:        General: Normal range of motion.     Cervical back: Normal range of motion.  Lymphadenopathy:     Cervical: No cervical adenopathy.  Skin:    General: Skin is warm and dry.  Neurological:     Mental Status: He is alert and oriented to person, place, and time.    Physical Exam VITALS: P- 111 GENERAL: Alert, cooperative, well developed, no acute distress HEENT: Normocephalic, normal oropharynx, moist mucous membranes CHEST: Clear to auscultation bilaterally, No wheezes, rhonchi, or crackles CARDIOVASCULAR: Normal heart rate and rhythm, S1 and S2 normal without murmurs ABDOMEN: Soft, non-tender, non-distended, without organomegaly, Normal bowel  sounds GENITOURINARY: Genitourinary exam normal EXTREMITIES: No cyanosis or edema NEUROLOGICAL: Cranial nerves grossly intact, Moves all extremities without gross motor or sensory deficit   Assessment & Plan:  Encounter for immunization -     Flu vaccine HIGH DOSE PF(Fluzone Trivalent)  Primary hypertension -     Valsartan ; Take 1 tablet (160 mg total) by mouth daily.  Dispense: 90 tablet; Refill: 3 -     CBC with Differential/Platelet -     CMP14+EGFR -     Urinalysis  Gastroesophageal reflux disease with esophagitis without hemorrhage -     Pantoprazole  Sodium; Take 1 tablet (40 mg total) by mouth daily.  Dispense: 90 tablet; Refill: 3 -     CBC with Differential/Platelet -     CMP14+EGFR  Chronic gout due to renal impairment involving foot without  tophus, unspecified laterality -     Allopurinol ; Take 2 tablets (200 mg total) by mouth daily.  Dispense: 180 tablet; Refill: 3 -     CBC with Differential/Platelet -     CMP14+EGFR -     Uric acid  Polycystic kidney, autosomal dominant -     CBC with Differential/Platelet -     CMP14+EGFR -     Urinalysis  Tachycardia -     CBC with Differential/Platelet -     CMP14+EGFR -     EKG 12-Lead  Polycystic kidney disease -     PSA, total and free  Mixed hyperlipidemia -     Lipid panel    Assessment and Plan Assessment & Plan Tachycardia   He presents with tachycardia, pulse at 111 bpm, and is concerned about the elevated heart rate, expecting it to be around 72 bpm. He reports no gastrointestinal symptoms and has no history of myocardial infarction. A previous emergency room visit for similar concerns led to a diagnosis of polycystic kidney disease. Perform an EKG to evaluate the tachycardia.  Chronic kidney disease, stage 4   He has stage 4 chronic kidney disease with stable kidney function over the past 10 years. Current creatinine level is 2.6, down from a previous high of 3.6. He understands dialysis may be necessary  if creatinine reaches 5 or kidney function drops to 10%, and prefers peritoneal dialysis due to personal circumstances. No current symptoms of concern related to kidney function. Follow up with nephrologist, Dr. Benjamen, every 4 months.       Follow-up: Return in about 6 months (around 11/23/2024).  Butler Der, M.D.

## 2024-05-26 LAB — CBC WITH DIFFERENTIAL/PLATELET
Basophils Absolute: 0.1 x10E3/uL (ref 0.0–0.2)
Basos: 1 %
EOS (ABSOLUTE): 0 x10E3/uL (ref 0.0–0.4)
Eos: 0 %
Hematocrit: 37.8 % (ref 37.5–51.0)
Hemoglobin: 13 g/dL (ref 13.0–17.7)
Immature Grans (Abs): 0 x10E3/uL (ref 0.0–0.1)
Immature Granulocytes: 0 %
Lymphocytes Absolute: 1.9 x10E3/uL (ref 0.7–3.1)
Lymphs: 15 %
MCH: 35.5 pg — ABNORMAL HIGH (ref 26.6–33.0)
MCHC: 34.4 g/dL (ref 31.5–35.7)
MCV: 103 fL — ABNORMAL HIGH (ref 79–97)
Monocytes Absolute: 1.1 x10E3/uL — ABNORMAL HIGH (ref 0.1–0.9)
Monocytes: 9 %
Neutrophils Absolute: 9.1 x10E3/uL — ABNORMAL HIGH (ref 1.4–7.0)
Neutrophils: 75 %
Platelets: 229 x10E3/uL (ref 150–450)
RBC: 3.66 x10E6/uL — ABNORMAL LOW (ref 4.14–5.80)
RDW: 12.9 % (ref 11.6–15.4)
WBC: 12.2 x10E3/uL — ABNORMAL HIGH (ref 3.4–10.8)

## 2024-05-26 LAB — PSA, TOTAL AND FREE
PSA, Free Pct: 37.5 %
PSA, Free: 0.15 ng/mL
Prostate Specific Ag, Serum: 0.4 ng/mL (ref 0.0–4.0)

## 2024-05-26 LAB — CMP14+EGFR
ALT: 23 IU/L (ref 0–44)
AST: 39 IU/L (ref 0–40)
Albumin: 4.3 g/dL (ref 3.8–4.8)
Alkaline Phosphatase: 166 IU/L — ABNORMAL HIGH (ref 47–123)
BUN/Creatinine Ratio: 13 (ref 10–24)
BUN: 40 mg/dL — ABNORMAL HIGH (ref 8–27)
Bilirubin Total: 0.6 mg/dL (ref 0.0–1.2)
CO2: 23 mmol/L (ref 20–29)
Calcium: 10 mg/dL (ref 8.6–10.2)
Chloride: 100 mmol/L (ref 96–106)
Creatinine, Ser: 3.03 mg/dL (ref 0.76–1.27)
Globulin, Total: 2.9 g/dL (ref 1.5–4.5)
Glucose: 113 mg/dL — ABNORMAL HIGH (ref 70–99)
Potassium: 4.5 mmol/L (ref 3.5–5.2)
Sodium: 142 mmol/L (ref 134–144)
Total Protein: 7.2 g/dL (ref 6.0–8.5)
eGFR: 20 mL/min/1.73 — ABNORMAL LOW (ref >59)

## 2024-05-26 LAB — LIPID PANEL
Chol/HDL Ratio: 1.9 ratio (ref 0.0–5.0)
Cholesterol, Total: 174 mg/dL (ref 100–199)
HDL: 90 mg/dL (ref >39)
LDL Chol Calc (NIH): 62 mg/dL (ref 0–99)
Triglycerides: 128 mg/dL (ref 0–149)
VLDL Cholesterol Cal: 22 mg/dL (ref 5–40)

## 2024-05-26 LAB — URIC ACID: Uric Acid: 4.5 mg/dL (ref 3.8–8.4)

## 2025-05-29 ENCOUNTER — Ambulatory Visit: Admitting: Family Medicine

## 2025-05-29 ENCOUNTER — Encounter: Admitting: Family Medicine
